# Patient Record
Sex: Male | Born: 1980 | Hispanic: Yes | Marital: Single | Smoking: Never smoker
Health system: Southern US, Community
[De-identification: ages and names within clinical notes are randomized; demographics above are authoritative.]

## PROBLEM LIST (undated history)

## (undated) DIAGNOSIS — R0989 Other specified symptoms and signs involving the circulatory and respiratory systems: Secondary | ICD-10-CM

## (undated) DIAGNOSIS — I509 Heart failure, unspecified: Secondary | ICD-10-CM

## (undated) HISTORY — PX: COARCTATION OF AORTA REPAIR: SHX261

---

## 2017-05-18 DIAGNOSIS — I517 Cardiomegaly: Secondary | ICD-10-CM | POA: Insufficient documentation

## 2017-05-18 DIAGNOSIS — R931 Abnormal findings on diagnostic imaging of heart and coronary circulation: Secondary | ICD-10-CM | POA: Insufficient documentation

## 2017-11-08 DIAGNOSIS — R600 Localized edema: Secondary | ICD-10-CM

## 2017-11-08 DIAGNOSIS — R740 Nonspecific elevation of levels of transaminase and lactic acid dehydrogenase [LDH]: Secondary | ICD-10-CM

## 2017-11-08 DIAGNOSIS — I509 Heart failure, unspecified: Secondary | ICD-10-CM

## 2017-11-08 DIAGNOSIS — R0602 Shortness of breath: Secondary | ICD-10-CM

## 2017-11-09 DIAGNOSIS — I5032 Chronic diastolic (congestive) heart failure: Secondary | ICD-10-CM

## 2017-11-09 DIAGNOSIS — I351 Nonrheumatic aortic (valve) insufficiency: Secondary | ICD-10-CM

## 2017-11-10 DIAGNOSIS — Q231 Congenital insufficiency of aortic valve: Secondary | ICD-10-CM

## 2017-11-13 DIAGNOSIS — I351 Nonrheumatic aortic (valve) insufficiency: Secondary | ICD-10-CM

## 2017-11-13 DIAGNOSIS — Q231 Congenital insufficiency of aortic valve: Secondary | ICD-10-CM

## 2017-11-13 DIAGNOSIS — I429 Cardiomyopathy, unspecified: Secondary | ICD-10-CM | POA: Insufficient documentation

## 2017-11-13 DIAGNOSIS — I5032 Chronic diastolic (congestive) heart failure: Secondary | ICD-10-CM | POA: Insufficient documentation

## 2017-11-13 DIAGNOSIS — R609 Edema, unspecified: Secondary | ICD-10-CM

## 2017-11-13 DIAGNOSIS — I5030 Unspecified diastolic (congestive) heart failure: Secondary | ICD-10-CM

## 2017-11-13 DIAGNOSIS — I509 Heart failure, unspecified: Secondary | ICD-10-CM | POA: Insufficient documentation

## 2017-11-13 DIAGNOSIS — E119 Type 2 diabetes mellitus without complications: Secondary | ICD-10-CM

## 2017-11-16 DIAGNOSIS — E119 Type 2 diabetes mellitus without complications: Secondary | ICD-10-CM | POA: Insufficient documentation

## 2017-11-18 ENCOUNTER — Ambulatory Visit (INDEPENDENT_AMBULATORY_CARE_PROVIDER_SITE_OTHER): Payer: Self-pay | Admitting: Cardiology

## 2017-11-18 ENCOUNTER — Encounter: Payer: Self-pay | Admitting: Cardiology

## 2017-11-18 VITALS — BP 108/64 | HR 76 | Ht 69.0 in | Wt 251.8 lb

## 2017-11-18 DIAGNOSIS — Q251 Coarctation of aorta: Secondary | ICD-10-CM

## 2017-11-18 DIAGNOSIS — I5032 Chronic diastolic (congestive) heart failure: Secondary | ICD-10-CM

## 2017-11-18 DIAGNOSIS — Q210 Ventricular septal defect: Secondary | ICD-10-CM

## 2017-11-18 DIAGNOSIS — I272 Pulmonary hypertension, unspecified: Secondary | ICD-10-CM

## 2017-11-18 DIAGNOSIS — I351 Nonrheumatic aortic (valve) insufficiency: Secondary | ICD-10-CM

## 2017-11-18 DIAGNOSIS — G4733 Obstructive sleep apnea (adult) (pediatric): Secondary | ICD-10-CM

## 2017-11-18 DIAGNOSIS — I422 Other hypertrophic cardiomyopathy: Secondary | ICD-10-CM | POA: Insufficient documentation

## 2017-11-18 NOTE — Progress Notes (Signed)
Cardiology Consultation:    Date:  11/18/2017   ID:  Gabriel Floyd, DOB 12/10/1980, MRN [id number]  PCP:  Patient, [name]  Cardiologist:  Gypsy Balsamobert Thaddus Mcdowell, MD   Referring MD: No ref. provider found   Chief Complaint  Patient presents with  . Hospitalization Follow-up  I am doing well now  History of Present Illness:    Gabriel Floyd is a 36 y.o. male who is being seen today for the evaluation of shortness of breath at the request of No ref. provider found.  He had a history of coarctation of the aorta that was [place] the age of 89 [place]  Since that time he has been doing great he was told everything is fine and there is no problem.  However with him period  Of last few weeks he started experiencing exertional shortness of breath.  He works as a Corporate investment bankerconstruction worker and a week before hospitalization he started having swelling of lower extremities shortness of breath he could not sleep well at night.  He also felt distention in his abdomen.  He ended up going to the hospital he was diagnosed with decompensated congestive heart failure and a lot of test has been done.  That include echocardiogram which showed significant left ventricle hypertrophy, likely there was no gradient in left ventricular outflow tract.  A transmitral flow pattern indicated restrictive flow.  It also indicated elevated filling pressures.  He was appropriately managed with diuretic.  TEE has been done as well to verify check for bicuspid aortic valve however valve was clearly tricuspid.  There was moderate aortic insufficiency present.  There was also investigation trying to figure out why he has elevation of the pulmonary pressure.  That included a CT of his chest which showed no evidence of pulmonary emboli.  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  Since the time of hospitalization he is doing great he denies having any chest pain tightness squeezing pressure burning chest he  can work with no difficulty everything returned to normal.   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  No past medical history on file.    Current Medications: Current Meds  Medication Sig  . furosemide (LASIX) 40 MG tablet Take 40 mg by mouth daily.     Allergies:   Patient has no known allergies.   Social History   Socioeconomic History  . Marital status: Unknown    Spouse name: None  . Number of children: None  . Years of education: None  . Highest education level: None  Social Needs  . Financial resource strain: None  . Food insecurity - worry: None  . Food insecurity - inability: None  . Transportation needs - medical: None  . Transportation needs - non-medical: None  Occupational History  . None  Tobacco Use  . Smoking status: Never Smoker  . Smokeless tobacco: Never Used  Substance and Sexual Activity  . Alcohol use: No    Frequency: Never  . Drug use: No  . Sexual activity: None  Other Topics Concern  . None  Social History Narrative  . None     Family History: The patient's family history includes Healthy in his father and mother. ROS:   Please see the history of present  illness.    All 14 point review of systems negative except as described per history of present illness.  EKGs/Labs/Other Studies Reviewed:    The following studies were reviewed today: CT of the chest, echocardiogram as well as TEE reviewed from hospital  EKG:  EKG is  ordered today.  The ekg ordered today demonstrates normal sinus rhythm at rate of 77, intraventricular conduction delay right bundle branch block pattern.  We will check blood pressure in both arms and both legs left arm 100/64 right arm 110/60 left leg 118/70 right leg 118/70  Recent Labs: No results found for requested labs within last 8760 hours.  Recent  Lipid Panel No results found for: CHOL, TRIG, HDL, CHOLHDL, VLDL, LDLCALC, LDLDIRECT  Physical Exam:    VS:  BP 108/64   Pulse 76   Ht 5\' 9"  (1.753 m)   Wt 251 lb 12.8 oz (114.2 kg)   SpO2 97%   BMI 37.18 kg/m     Wt Readings from Last 3 Encounters:  11/18/17 251 lb 12.8 oz (114.2 kg)     GEN:  Well nourished, well developed in no acute distress HEENT: Normal NECK: No JVD; No carotid bruits LYMPHATICS: No lymphadenopathy CARDIAC: RRR, no murmurs, no rubs, no gallops RESPIRATORY:  Clear to auscultation without rales, wheezing or rhonchi  ABDOMEN: Soft, non-tender, non-distended MUSCULOSKELETAL:  No edema; No deformity  SKIN: Warm and dry NEUROLOGIC:  Alert and oriented x 3 PSYCHIATRIC:  Normal affect   ASSESSMENT:    1. Nonrheumatic aortic valve insufficiency   2. Chronic diastolic CHF (congestive heart failure) (HCC)   3. Coarctation of aorta   4. Pulmonary hypertension, unspecified (HCC)   5. Obstructive sleep apnea    PLAN:    In order of problems listed above:  1. Nonrheumatic aortic valve regurgitation: There is been described as moderate.  I do not think it is responsible for his symptoms. 2. Chronic diastolic congestive heart failure.  Seems to be well managed he is only on diuretics in form of furosemide there is no potassium I will check his Chem-7 as well as proBNP today.  I will also check his complete metabolic panel since he did have some transaminase elevation.  I suspect that was related to congestion. 3. History of coarctation of the aorta status post repair.  I will review his CT from around of hospital to see if we can look at his aorta and see if there is any problem there.  He may require some additional testing to clarify that. 4. Pulmonary hypertension: So far etiology of this phenomenon is unclear.  It could be related to left side problem.  He does have significant diastolic dysfunction with restrictive pattern.  The way to act for this situation is  to manage his congestive heart failure and then recheck his pulmonary pressure.  Pulmonary emboli has been rule out by CT of his chest.  We also need to check for significant sleep apnea.  I will schedule him to have a sleep study. 5. Obstructive sleep apnea that I suspect apparently snores a lot however according to his family since he is being managed appropriately snoring is gone but as a part of evaluation for his pulmonary hypertension sleep study need to be rule out. 6. Obstructive hypertrophic cardiomyopathy.  Probably significantly responsible for his diastolic dysfunction.  He may require the addition of beta-blocker.  For now we will managed with diuretic his diastolic congestive heart failure.  He did have history coarctation  of the aorta however that was fixed when he was 36 years old.  I will check his blood pressure today on both arms and legs.  Future he may require a cardiac catheterization left and right mostly to check his pulmonary pressure if we need to initiate therapy for pulmonary hypertension.  I would hope however that with management of his diastolic congestive heart failure and sleep apnea if he has one pulmonary pressure will at least stabilize hopefully normalize.  I will see him back in my office in about 1 month or sooner if he has a problem.   Medication Adjustments/Labs and Tests Ordered: Current medicines are reviewed at length with the patient today.  Concerns regarding medicines are outlined above.  No orders of the defined types were placed in this encounter.  No orders of the defined types were placed in this encounter.   Signed, Georgeanna Lea, MD, [facility]. 11/18/2017 9:24 AM    [facility]

## 2017-11-18 NOTE — Patient Instructions (Signed)
Medicacion: NO hay cambio  Laboratorios: BNP and CMP  Examenes: estudio del Teacher, adult education en un mes  Any Other Special Instructions Will Be Listed Below (If Applicable).     If you need a refill on your cardiac medications before your next appointment, please call your pharmacy.   [facility]  Garey Ham, RN, BSN  Apnea del sueo (Sleep Apnea) La apnea del sueo es una afeccin en la que la respiracin se detiene o se hace superficial durante el sueo. Los episodios de apnea del sueo suelen durar 10 segundos o ms, y pueden ocurrir hasta 20 veces por hora. La apnea del sueo interrumpe el sueo y evita que el cuerpo descanse como lo necesita. Esta afeccin puede aumentar el riesgo de sufrir ciertos problemas de [place], como los siguientes:  Infarto de miocardio.  Ictus.  Obesidad.  Diabetes.  Insuficiencia cardaca.  Latidos cardacos irregulares. Existen tres tipos de apnea del sueo:  Tiene apnea obstructiva del sueo. Este tipo de apnea ocurre cuando las vas respiratorias se obstruyen o colapsan.  Apnea central del sueo. Este tipo ocurre cuando la parte del cerebro que controla la respiracin no enva las seales correctas a los msculos que controlan la respiracin.  Apnea mixta del sueo. Esta es una combinacin de apnea mixta y central del sueo. CAUSAS La causa ms frecuente de esta afeccin es la obstruccin o el colapso de las vas respiratorias. Las vas respiratorias pueden colapsar o bloquearse en los siguientes casos:  Los msculos de la garganta estn anormalmente relajados.  La lengua y las amgdalas son ms grandes que lo normal.  Tiene sobrepeso.  Las vas respiratorias son ms pequeas que lo normal. FACTORES DE RIESGO Es ms probable que esta afeccin se manifieste en las personas que:  Tienen sobrepeso.  Fuman.  Tienen vas respiratorias ms pequeas que lo normal.  Son ancianos.  Son hombres.  Bebe alcohol.  Toman sedantes  o tranquilizantes.  Tienen antecedentes familiares de apnea del sueo. SNTOMAS Los sntomas de esta afeccin incluyen lo siguiente:  Dificultad para quedarse dormido.  Somnolencia y Chemical engineer.  Irritabilidad.  Ronquidos fuertes.  Dolores de cabeza matutinos.  Dificultad para concentrarse.  Olvidos.  Disminucin del inters por el sexo.  Somnolencia sin motivo aparente.  Cambios en el estado de nimo.  Cambios en la personalidad.  Sentimientos de depresin.  Levantarse con frecuencia durante la noche para orinar.  [employer].  Dolor de Advertising copywriter. DIAGNSTICO Esta afeccin se puede diagnosticar a travs de lo siguiente:  Los antecedentes mdicos.  Un examen fsico.  Neomia Dear serie de pruebas que se hacen mientras la persona duerme (estudio del sueo). Estas pruebas generalmente se hacen en un laboratorio del sueo, pero tambin pueden Architectural technologist. TRATAMIENTO El tratamiento de esta afeccin tiene como objetivo restablecer la respiracin normal y [employer]ak sntomas durante el sueo. Puede implicar controlar los problemas de salud que pueden afectar la respiracin, como la hipertensin arterial o la obesidad. El tratamiento puede incluir lo siguiente:  Dormir de Mudlogger.  Si tiene congestin nasal, Freight forwarder.  Evitar el consumo de depresores, como el alcohol, sedantes y opiceos.  Si tiene sobrepeso, [employer].  Realizar cambios en la dieta.  Dejar de fumar.  Usar un dispositivo para abrir las vas respiratorias mientras duerme; por ejemplo: ? Un aparato bucal. Se trata de una boquilla hecha a medida que desplaza la mandbula hacia adelante. ? Un dispositivo de presin positiva y continua de  las vas respiratorias (PPC). Este dispositivo suministra oxgeno a las vas respiratorias a travs de Tourist information centre manageruna mscara. ? Un dispositivo de presin espiratoria nasal (EPAP) de las vas respiratorias Este dispositivo tiene vlvulas que se  colocan en cada fosa nasal. ? Un dispositivo de presin positiva de Colgate Palmolivedos niveles (BPAP) de las vas respiratorias. Este dispositivo suministra oxgeno a las vas respiratorias a travs de Tourist information centre manageruna mscara.  Someterse a Biomedical engineerciruga si los dems tratamientos no Comptrollerresultan eficaces. Durante la ciruga, el exceso de tejido se elimina para aumentar el ancho de las vas respiratorias. Realizar un tratamiento para la apnea del sueo es importante. Sin el tratamiento [place]adecuado, esta afeccin puede causar lo siguiente:  Hipertensin arterial.  Enfermedad arterial coronaria.  Incapacidad (del hombre) para alcanzar o Designer, fashion/clothingmantener una ereccin (impotencia).  Reduccin de las habilidades de pensamiento. INSTRUCCIONES PARA EL CUIDADO EN EL HOGAR  Haga cambios en su estilo de vida segn las recomendaciones de su mdico.  Consuma una dieta sana y [place]igua and Barbudabien equilibrada.  Tome los medicamentos de venta libre y los recetados solamente como se lo haya indicado el mdico.  Evite el uso de depresores, como el alcohol, sedantes y narcticos.  Si tiene sobrepeso, tome medidas para bajar de [place]peso.  Si le proporcionaron un dispositivo para abrir las vas respiratorias mientras duerme, selo solamente como se lo haya indicado el mdico.  No consuma ningn producto que contenga tabaco, lo que incluye cigarrillos, tabaco de Theatre managermascar y Administrator, Civil Servicecigarrillos electrnicos. Si necesita ayuda para dejar de fumar, consulte al mdico.  Concurra a todas las visitas de control como se lo haya indicado el mdico. Esto es importante. SOLICITE ATENCIN MDICA SI:  El dispositivo que recibi para abrir las vas respiratorias durante el sueo es incmodo o no parece funcionar.  Los sntomas no mejoran.  Los sntomas empeoran. SOLICITE ATENCIN MDICA DE INMEDIATO SI:  Siente dolor en el pecho.  Le falta el aire.  Desarrolla Dentistmalestar en la espalda, los brazos o el Peaceful Villageestmago.  Tiene dificultad para hablar.  Siente debilidad o adormecimiento en un  lado del cuerpo.  Tiene parlisis facial. Estos sntomas pueden representar un problema grave que constituye Radio broadcast assistantuna emergencia. No espere hasta que los sntomas desaparezcan. Solicite atencin mdica de inmediato. Comunquese con el servicio de emergencias de su localidad (911 en los [place]). No conduzca por sus propios medios OfficeMax Incorporatedhasta el hospital. Esta informacin no tiene Theme park managercomo fin reemplazar el consejo del mdico. Asegrese de hacerle al mdico cualquier pregunta que tenga. Document Released: 08/27/2005 Document Revised: 03/10/2016 Document Reviewed: 08/27/2015 Elsevier Interactive Patient Education  2018 [employer]vier Inc. Anlisis de pptidos natriurticos (Natriuretic Peptides Test) POR QU ME HAGO ESTE ANLISIS? Estos anlisis ayudan a diagnosticar la presencia de insuficiencia cardaca y a Chief Strategy Officerdeterminar su gravedad. Es posible que deba someterse a estas pruebas si est recibiendo tratamiento para la insuficiencia cardaca o si tiene sntomas de insuficiencia cardaca, incluidos las siguientes:  Falta de aire.  Fatiga. Hay tres pptidos natriurticos principales:  ANP. Es producido por la cavidad del corazn que recibe la sangre del organismo (Dorothyaurcula).  BNP. Es producido por la principal cavidad de bombeo del corazn (ventrculo izquierdo).  CNP. Es producido por las clulas que recubren los vasos sanguneos (clulas endoteliales). En particular, los niveles de BNP pueden ayudar al mdico a lo siguiente:  Diagnosticar anomalas e insuficiencia cardaca.  Controlar el avance y el tratamiento de la enfermedad. QU TIPO DE MUESTRA SE TOMA? Se requiere [place]una muestra de sangre para [place]este anlisis. Generalmente, se obtiene Marathon Oilmediante la insercin  de una aguja en una vena. CMO ME PREPARO PARA EL ANLISIS? No se requiere una preparacin para [employer]. CULES SON LOS VALORES DE REFERENCIA? Los valores de referencia son los valores saludables establecidos despus de Education officer, environmental el anlisis  a un grupo grande de personas sanas. Los valores de referencia pueden variar entre [employer], laboratorios y hospitales. Es su responsabilidad retirar el resultado del [place]. Consulte en el laboratorio o en el departamento en el que fue realizado el estudio cundo y cmo podr Starbucks Corporation. Los valores de referencia son los siguientes:  ANP: de 22 a 77pg/ml o de 22 a 77ng/l (unidades SI).  BNP: menos de 100pg/ml o menos de 100ng/l (unidades SI).  CNP: Estos valores an no se han determinado. QU SIGNIFICAN LOS RESULTADOS? Los Apache Corporation valores de referencia pueden indicar lo siguiente:  Insuficiencia cardaca.  Infarto de miocardio.  Presin arterial elevada (hipertensin arterial).  Rechazo del trasplante de corazn. Hable con el mdico para [employer], las opciones de tratamiento y, si es necesario, la necesidad de Education officer, environmental ms [place]. Hable con el mdico si tiene [employer]ne[employer]. Esta informacin no tiene Theme park manager el consejo del mdico. Asegrese de hacerle al mdico cualquier pregunta que tenga. Document Released: 09/07/2013 Document Revised: 12/08/2014 Document Reviewed: 05/08/2014 Elsevier Interactive Patient Education  2018 Ar[employer]itor.  Panel metablico completo (Comprehensive Metabolic Panel) El panel metablico completo (PMC) mide los niveles de las siguientes sustancias en la sangre:  Glucosa. La glucosa es un azcar simple que sirve como principal fuente de energa del organismo.  Creatinina. La creatinina es un producto de desecho de la actividad muscular normal y se elimina del cuerpo a travs de los riones.  Nitrgeno ureico en sangre (BUN). El nitrgeno ureico es un producto de desecho de la degradacin de las protenas que se produce cuando se degrada el exceso de protenas del organismo y se lo [place] para Engineer, drilling. Se elimina a travs de los riones.  Electrolitos. Los  electrolitos son partculas de carga positiva o negativa que se disuelven en el agua de diferentes compartimientos corporales, por ejemplo, el suero sanguneo, el agua dentro de las clulas y el agua fuera de ellas. Las Child psychotherapist de Customer service manager varan entre los diferentes compartimientos de lquidos. Los electrolitos son estrictamente regulados para Pharmacologist el equilibrio hidrosalino y Engineer, technical sales en el organismo. Los electrolitos incluyen lo siguiente: ? Potasio. ? Sodio. ? Cloruro. ? Calcio. ? Bicarbonato.  Fosfatasa alcalina (ALP). Esta protena se encuentra en todos los tejidos del cuerpo. Los huesos, el hgado y los conductos biliares contienen las cantidades ms elevadas.  Alanina-aminotransferasa (ALT). La ALT es una enzima que se encuentra en todo el cuerpo. Las concentraciones ms altas se hallan en los tejidos del hgado. Si hay dao heptico, tambin habr ALT en el torrente sanguneo.  Aspartato-aminotransferasa (AST). Otra enzima que se halla principalmente en el hgado. Tambin hay una alta concentracin en las clulas musculares y en el corazn. El anlisis de AST es til para controlar si hay dao heptico.  Bilirrubina. Cuando el hgado degrada los glbulos rojos, produce un desecho llamado bilirrubina. Un nivel alto de bilirrubina puede indicar la presencia de ciertos problemas de salud.  Albmina. Esta protena es un componente importante de la parte lquida de la sangre (suero). Se produce en el hgado y se la puede medir en el torrente sanguneo.  Protena total. Es la medicin de la cantidad Brunswick Corporation tipos de protenas que se hallan  en el suero sanguneo: la albmina y las globulinas. Las globulinas son parte del sistema inmunitario. El panel metablico completo exige la toma de [place] de sangre de una vena del brazo o la mano. PREPARACIN PARA EL ESTUDIO El mdico puede indicarle que no tome ni beba nada durante 6 a 8horas antes de la extraccin de la [place]  de [place]. RESULTADOS Es su responsabilidad retirar el resultado del [place]. Consulte en el laboratorio o en el departamento en el que fue realizado el estudio cundo y cmo podr Starbucks Corporation. Comunquese con el mdico si tiene Smith International. RANGO DE VALORES NORMALES Los rangos para los valores normales pueden variar entre diferentes laboratorios y hospitales. Siempre debe consultar a su mdico despus de realizarse un anlisis u otros estudios para saber si los valores de sus [place] se consideran dentro de los lmites normales. Los siguientes son los valores normales de cada seccin de un PMC: Glucosa  Cordn umbilical: de 45 a 96mg /dl o de 2,5 a 8,1XBJY/N (unidades SI).  Bebs prematuros: de 20 a 60mg /dl o de 1,1 a 8,2NFAO/Z.  Neonatos: de 30 a 60mg /dl o de 1,7 a 3,0QMVH/Q.  Bebs: de 40 a 90mg /dl o de 2,2 a 4,6NGEX/B.  Nios menores de 2aos: de 60 a 100mg /dl o de 3,3 a 2,8UXLK/G.  Adultos o nios mayores de 2aos: ? En ayunas. de 70 a 110mg /dl o menos de 4,0NUUV/O. ? Aleatorio (sin estar en ayunas o al azar): menos de o igual a 200mg /dl, o menos de 53,6UYQI/H.  Ancianos: aumento del valor normal despus de los 50aos. Creatinina  Nios menores de 2aos: de 0,1 a 0,4mg /dl.  Nios de 2 a 5aos: de 0,2 a 0,5mg /dl.  Nios de 6 a 9aos: de 0,3 a 0,6mg /dl.  Nios o adolescentes de 10 a 17aos: de 0,4 a 1,0mg /dl.  Adultos de 18 a 40aos: ? Mujeres: de 0,5 a 1,0mg /dl. ? Hombres: de 0,6 a 1,2mg /dl.  Adultos de 41 a 60aos: ? Mujeres: de 0,5 a 1,1mg /dl. ? Hombres: de 0,6 a 1,3mg /dl.  Adultos mayores de [phone]: ? Mujeres: de 0,5 a 1,2mg /dl. ? Hombres: de 0,7 a 1,3mg /dl. Nitrgeno ureico en sangre (BUN)  Cordn umbilical: de 21 a 40mg /dl.  Recin nacidos: de 3 a 12mg /dl.  Bebs: de 5 a 18mg /dl.  Nios: de 5 a 18mg /dl.  Adultos: de 10 a 20mg /dl o de 3,6 a 9,5GLOV/F (unidades SI).  Ancianos: los  valores pueden ser levemente ms [employer]. Potasio  Recin nacidos: de 3,9 a 5,42mEq/l.  Bebs: de 4,1 a 5,28mEq/l.  Nios: de 3,4 a 4,68mEq/l.  Adultos o ancianos: de 3,5 a 5,26mEq/l o de 3,5 a 5,58mmol/l (unidades SI). Sodio  Recin nacidos: de 134 a 131mEq/l.  Bebs: de 134 a 131mEq/l.  Nios: de 136 a 180mEq/l.  Adultos o ancianos: de 136 a 148mEq/l o de 136 a 174mmol/l (unidades SI). Cloruro  Bebs prematuros: de 95 a 133mEq/l.  Recin nacidos: de 96 a 163mEq/l.  Nios: de 90 a 115mEq/l.  Adultos o ancianos: de 98 a 176mEq/l o de 98 a 189mmol/l (unidades SI). Calcio  Calcio total: ? Recin nacidos de menos de 10das: de 7,6 a 10,4mg /dl o de 1,9 a 6,43PIRJ/J. ? Umbilical: de 9,0 a 11,5mg /dl o de 8,84 a 1,66AYTK/Z. ? De [address] a 2aos: de 9,0 a 10,6mg /dl o de 2,3 a 6,01UXNA/T. ? Nios: de 8,8 a 10,8mg /dl o de 2,2 a 5,5DDUK/G. ? Adultos: de 9,0 a 10,5mg /dl o de 2,54 a  2,33mmol/l.  Calcio ionizado: ? Recin nacidos: de 4,20 a 5,58mg /dl o de 4,09 a 8,11BJYN/W. ? De a 18aos: de 4,80 a 5,52mg /dl o de 2,95 a 6,21HYQM/V. ? Adultos: de 4,5 a 5,6mg /dl o de 7,84 a 6,96EXBM/W. Bicarbonato  Recin nacidos: de 13 a 38mEq/l.  Bebs: de 20 a 63mEq/l.  Nios: de 20 a 67mEq/l.  Adultos o ancianos: de 23 a 84mEq/l o de 23 a 68mmol/l (unidades SI). ALP  Nios menores de 2aos: de 85 a 235unidades/l.  De 2 a 8aos: de 65 a 210unidades/l.  De 9 a 15aos: de 60 a 300unidades/l.  De 16 a 21aos: de 30 a 200unidades/l.  Adultos: de 30 a 120unidades/l o de 0,5 a 2,0 microkatal/l (unidades SI).  Ancianos: valores levemente ms altos que Sears Holdings Corporation. ALT  Bebs: sus valores pueden ser dos veces ms altos que Sears Holdings Corporation.  Nios o adultos: de 4 a 36 unidades internacionales/l a 98,62F (37C) o de 4 a 36unidades/l (unidades SI).  Ancianos: sus valores pueden ser levemente ms altos que [employer]. AST  Recin nacidos de 0 a 5das de vida: de 35 a 140unidades/l.  Nios menores de 3aos: de 15 a 60unidades/l.  De 3 a 6aos: de 15 a 50unidades/l.  De 6 a 12aos: de 10 a 50unidades/l.  De 12 a 18aos: de 10 a 40unidades/l.  Adultos: de 0 a 35unidades/l o de 0 a 0,64microkatal/l (unidades SI).  Ancianos: valores levemente ms altos que Sears Holdings Corporation. Bilirrubina  Bilirrubina total en el recin nacido: de 1,0 a 12,0mg /dl o de 41,3 a 244 micromoles/l (unidades SI).  Adultos, ancianos o nios: ? Bilirrubina total: de 0,3 a 1,0mg /dl o de 5,1 a 01UUVOZDGUYQ/I. ? Bilirrubina indirecta: de 0,2 a 0,8mg /dl o de 3,4 a 34,7QQVZDGLOVF/I. ? Bilirrubina directa: de 0,1 a 0,3mg /dl o de 1,7 a 4,3PIRJJOACZY/S. Albmina  Bebs prematuros: de 3,0 a 4,2g/dl.  Recin nacidos: de 3,5 a 5,4g/dl.  Bebs: de 4,4 a 5,4g/dl.  Nios: de 4,0 a 5,9g/dl.  Adultos o ancianos: de 3,5 a 5,0g/dl o de 35 a 06T/K (unidades SI). Protena total  Bebs prematuros: de 4,2 a 7,6g/dl.  Recin nacidos: de 4,6 a 7,4g/dl.  Bebs: de 6,0 a 6,7g/dl.  Nios: de 6,2 a 8,0g/dl.  Adultos o ancianos: de 6,4 a 8,3g/dl o de 64 a 16W/F (unidades SI). SIGNIFICADO DE LOS RESULTADOS QUE ESTN FUERA DE LOS RANGOS DE LOS VALORES NORMALES La dieta y los niveles de actividad pueden tener un [employer] del [place], y a Occupational psychologist, pueden ser la causa de que los valores estn fuera de los lmites normales. Sin embargo, en ocasiones, los valores que estn fuera de los lmites normales pueden indicar la presencia de un trastorno mdico: Glucosa El aumento anormal de los niveles de glucosa (hiperglucemia) suele estar asociado con prediabetes y diabetes mellitus. Tambin puede presentarse cuando el organismo sufre una situacin de estrs intenso. Este estrs puede ser el resultado de una ciruga o de episodios tales como ictus o traumatismos. El hipertiroidismo y la pancreatitis o el  cncer de pncreas tambin pueden provocar el aumento anormal de los niveles de [place]. La disminucin anormal de los niveles de glucosa (hipoglucemia) puede presentarse cuando hay hipotiroidismo y tumores atpicos que segregan insulina (insulinoma). Creatinina El aumento anormal de los niveles de creatinina se observa con ms frecuencia en los casos de insuficiencia renal y, adems, cuando hay hipertiroidismo, enfermedades relacionadas con el crecimiento excesivo del cuerpo (acromegalia o gigantismo), destruccin  anormal del tejido muscular (rabdomilisis) y distrofia muscular temprana. La disminucin anormal de los niveles de creatinina puede indicar baja masa muscular asociada con desnutricin o distrofia muscular en etapa avanzada. Nitrgeno [employer] (BUN) El aumento anormal de los niveles de nitrgeno ureico en sangre (BUN), en especial, por encima de 50mg /dl, generalmente significa que los riones no estn funcionando normalmente. La disminucin anormal del BUN puede observarse en los casos de desnutricin e insuficiencia heptica. Potasio El aumento anormal de los niveles de potasio (hiperpotasiemia) se observa con mayor frecuencia en las enfermedades renales, la destruccin masiva de los glbulos rojos, (hemlisis) y la insuficiencia de las glndulas suprarrenales (enfermedad de [place]). La disminucin anormal de los niveles de potasio (hipopotasiemia) se observa cuando los niveles de la hormona aldosterona son excesivos (hiperaldosteronismo). Sodio El aumento anormal de los niveles de sodio (hipernatremia) puede observarse cuando hay deshidratacin, sed excesiva y miccin excesiva debido a una disminucin anormal de los niveles de la hormona antiduirtica (diabetes inspida). Adems, cuando hay hiperaldosteronismo y niveles excesivos de cortisol en el organismo (sndrome de [place]). La disminucin anormal de los niveles de sodio (hiponatremia) puede observarse cuando hay enfermedad  cardaca congestiva, cirrosis heptica, insuficiencia renal, as como en el sndrome de secrecin inadecuada de la hormona antidiurtica (SIHAD). Cloruro El aumento anormal de los niveles de cloruro (hipercloremia) puede observarse cuando hay insuficiencia renal aguda, diabetes inspida, diarrea prolongada e intoxicacin con aspirina o bromuro. La disminucin anormal de los niveles de cloruro (hipocloremia) puede observarse cuando hay episodios prolongados de vmitos, insuficiencia aguda de las glndulas suprarrenales (crisis [place]), hiperaldosteronismo y [place]. Calcio El aumento anormal de los niveles de calcio (hipercalcemia) puede presentarse con la hiperactividad de las glndulas paratiroideas (hiperparatiroidismo), ciertos tipos de cncer y un tipo de inflamacin que se observa en la sarcoidosis y la tuberculosis. La disminucin anormal de los niveles de calcio (hipocalcemia) puede observarse con la hipoactividad de las glndulas paratiroideas (hipoparatiroidismo), la deficiencia de la vitaminaD y la pancreatitis aguda. Bicarbonato El aumento anormal de los niveles de bicarbonato se observa despus de episodios prolongados de vmitos y Scientist, research (medical) diurtico, que derivan en la disminucin de la cantidad de cido en el organismo (alcalosis metablica). Tambin puede observarse en las enfermedades que aumentan la cantidad de bicarbonato en el organismo, entre ellas, el hiperaldosteronismo y los trastornos hereditarios poco frecuentes que interfieren en el control renal de los electrolitos, como el sndrome de Agricultural consultant. La disminucin anormal de los niveles de bicarbonato se observa en enfermedades que estimulan la produccin de cido del cuerpo (acidosis metablica), entre ellas, la diabetes mellitus que no se puede controlar y la intoxicacin por aspirina, metanol o anticongelantes (etilenglicol). ALP El aumento anormal del nivel de ALP puede ser un signo de ciertos tipos de cncer o tumores,  enfermedades hepticas, hepatitis, sarcoidosis, raquitismo, obstruccin de un conducto biliar o problemas seos, como una fractura. La disminucin anormal del nivel de ALP puede deberse a la desnutricin, la hipofosfatasia o la enfermedad de [place]. ALT El aumento anormal del nivel de ALT puede indicar la presencia de mononucleosis, pancreatitis o problemas hepticos. Los problemas hepticos incluyen hepatitis, cirrosis y cncer de hgado. AST El aumento anormal del nivel de AST puede indicar la presencia de algunos de los mismos problemas hepticos que se presentan con el aumento de la ALT. Tambin guarda relacin con enfermedades tales como la mononucleosis, la pancreatitis y con los traumatismos musculares. Bilirrubina El aumento anormal de la bilirrubina puede deberse a problemas hepticos, como la cirrosis, las enfermedades  hepticas y la hepatitis. Adems, a problemas de los conductos biliares, el pncreas o la vescula biliar. Albmina El aumento anormal de la albmina puede ser un signo de deshidratacin o del consumo de una dieta con alto contenido de protenas. Puede disminuir el nivel de albmina si consume una dieta con bajo contenido de protenas o se somete a una ciruga para bajar de [place]. La disminucin anormal del nivel de albmina tambin puede ser un signo de un problema de salud ms grave, como enfermedades hepticas, enfermedades renales o enfermedad de [place]. Protena total El aumento anormal del nivel de protena total suele indicar una infeccin (como hepatitisB oC, o VIH), mieloma mltiple o enfermedad de [place]. La disminucin anormal de los niveles de protena total se observa en las siguientes afecciones: desnutricin, quemaduras graves, hemorragias intensas y enfermedades hepticas. Esta informacin no tiene Theme park manager el consejo del mdico. Asegrese de hacerle al mdico cualquier pregunta que tenga. Document Released: 09/07/2013 Document Revised: 12/08/2014  Document Reviewed: 03/15/2014 Elsevier Interactive Patient Education  [employer].

## 2017-11-19 LAB — COMPREHENSIVE METABOLIC PANEL
ALBUMIN: 4.2 g/dL (ref 3.5–5.5)
ALT: 42 IU/L (ref 0–44)
AST: 37 IU/L (ref 0–40)
Albumin/Globulin Ratio: 1.3 (ref 1.2–2.2)
Alkaline Phosphatase: 73 IU/L (ref 39–117)
BUN / CREAT RATIO: 26 — AB (ref 9–20)
BUN: 23 mg/dL — AB (ref 6–20)
Bilirubin Total: 0.7 mg/dL (ref 0.0–1.2)
CALCIUM: 9.6 mg/dL (ref 8.7–10.2)
CO2: 22 mmol/L (ref 20–29)
CREATININE: 0.87 mg/dL (ref 0.76–1.27)
Chloride: 102 mmol/L (ref 96–106)
GFR, EST AFRICAN AMERICAN: 130 mL/min/{1.73_m2} (ref >59)
GFR, EST NON AFRICAN AMERICAN: 113 mL/min/{1.73_m2} (ref >59)
GLUCOSE: 119 mg/dL — AB (ref 65–99)
Globulin, Total: 3.2 g/dL (ref 1.5–4.5)
Potassium: 4.7 mmol/L (ref 3.5–5.2)
Sodium: 139 mmol/L (ref 134–144)
TOTAL PROTEIN: 7.4 g/dL (ref 6.0–8.5)

## 2017-11-19 LAB — PRO B NATRIURETIC PEPTIDE: NT-Pro BNP: 1071 pg/mL — ABNORMAL HIGH (ref 0–86)

## 2017-11-26 ENCOUNTER — Telehealth: Payer: Self-pay | Admitting: Cardiology

## 2017-11-26 NOTE — Telephone Encounter (Signed)
Kelli from [place] [facility] called and would like to discuss a referral from doctor. October 29, 1981

## 2017-11-27 NOTE — Telephone Encounter (Signed)
Order for sleep study will be sent.

## 2017-11-27 NOTE — Telephone Encounter (Signed)
Attempted to call Tresa Endo.

## 2017-12-02 NOTE — Telephone Encounter (Signed)
Faxed referral

## 2017-12-16 ENCOUNTER — Telehealth: Payer: Self-pay | Admitting: Cardiology

## 2017-12-16 NOTE — Telephone Encounter (Signed)
Patient stated that he was supposed to have a sleep study done and he has yet to be called by Dr. Terrilee Croak office regarding this. Please contact the office asap and I will call the patient.

## 2017-12-16 NOTE — Telephone Encounter (Signed)
Patient wants a call to ask a question in spanish

## 2017-12-18 NOTE — Telephone Encounter (Signed)
Left voicemail for Gabriel Floyd at Dr. Terrilee Croak office to call back

## 2017-12-21 ENCOUNTER — Ambulatory Visit: Payer: Self-pay | Admitting: Cardiology

## 2017-12-21 NOTE — Telephone Encounter (Signed)
Patient states that he cannot go that day due to a prior appointment. Will go by office tomorrow to reschedule.

## 2017-12-21 NOTE — Telephone Encounter (Signed)
He has a consult for Jan. 23 at 10 am, will have have translation

## 2017-12-22 ENCOUNTER — Other Ambulatory Visit: Payer: Self-pay

## 2017-12-22 MED ORDER — FUROSEMIDE 40 MG PO TABS: 40.0000 mg | ORAL_TABLET | Freq: Every day | ORAL | 1 refills | Status: DC

## 2017-12-22 NOTE — Telephone Encounter (Signed)
Patient will be going on 02/01 for his consultation with pulmonary.

## 2017-12-22 NOTE — Telephone Encounter (Signed)
Patient called stating that he will have his initial consultation with [facility] pulmonary on 02/01 at 10:00 am.

## 2017-12-24 ENCOUNTER — Other Ambulatory Visit: Payer: Self-pay

## 2017-12-24 ENCOUNTER — Telehealth: Payer: Self-pay | Admitting: Cardiology

## 2017-12-24 MED ORDER — FUROSEMIDE 40 MG PO TABS: 40.0000 mg | ORAL_TABLET | Freq: Every day | ORAL | 1 refills | Status: DC

## 2017-12-24 NOTE — Telephone Encounter (Signed)
Tishawn asked for you to call him, no reason given

## 2017-12-24 NOTE — Telephone Encounter (Signed)
Patient called stating that he had yet to receive his lasix. Refill was sent again.

## 2019-01-05 ENCOUNTER — Ambulatory Visit: Payer: Self-pay | Admitting: Cardiology

## 2019-02-18 ENCOUNTER — Other Ambulatory Visit: Payer: Self-pay | Admitting: Cardiology

## 2019-06-30 DIAGNOSIS — I361 Nonrheumatic tricuspid (valve) insufficiency: Secondary | ICD-10-CM

## 2019-06-30 DIAGNOSIS — I5032 Chronic diastolic (congestive) heart failure: Secondary | ICD-10-CM

## 2019-06-30 DIAGNOSIS — I517 Cardiomegaly: Secondary | ICD-10-CM

## 2019-06-30 DIAGNOSIS — Q231 Congenital insufficiency of aortic valve: Secondary | ICD-10-CM

## 2019-06-30 DIAGNOSIS — Z8679 Personal history of other diseases of the circulatory system: Secondary | ICD-10-CM

## 2019-06-30 DIAGNOSIS — R0989 Other specified symptoms and signs involving the circulatory and respiratory systems: Secondary | ICD-10-CM

## 2019-07-01 DIAGNOSIS — I351 Nonrheumatic aortic (valve) insufficiency: Secondary | ICD-10-CM

## 2019-07-01 DIAGNOSIS — I509 Heart failure, unspecified: Secondary | ICD-10-CM

## 2019-07-01 DIAGNOSIS — E876 Hypokalemia: Secondary | ICD-10-CM

## 2020-04-17 ENCOUNTER — Inpatient Hospital Stay (HOSPITAL_COMMUNITY)
Admission: AD | Admit: 2020-04-17 | Discharge: 2020-04-26 | DRG: 223 | Disposition: A | Discharge status: Home / Self Care | Payer: Self-pay | Source: Other Acute Inpatient Hospital | Attending: Internal Medicine | Admitting: Internal Medicine

## 2020-04-17 DIAGNOSIS — K761 Chronic passive congestion of liver: Secondary | ICD-10-CM | POA: Diagnosis present

## 2020-04-17 DIAGNOSIS — Z9289 Personal history of other medical treatment: Secondary | ICD-10-CM

## 2020-04-17 DIAGNOSIS — Z20822 Contact with and (suspected) exposure to covid-19: Secondary | ICD-10-CM | POA: Diagnosis present

## 2020-04-17 DIAGNOSIS — I422 Other hypertrophic cardiomyopathy: Secondary | ICD-10-CM | POA: Diagnosis present

## 2020-04-17 DIAGNOSIS — I272 Pulmonary hypertension, unspecified: Secondary | ICD-10-CM

## 2020-04-17 DIAGNOSIS — I5030 Unspecified diastolic (congestive) heart failure: Secondary | ICD-10-CM

## 2020-04-17 DIAGNOSIS — I2729 Other secondary pulmonary hypertension: Secondary | ICD-10-CM | POA: Diagnosis present

## 2020-04-17 DIAGNOSIS — I2781 Cor pulmonale (chronic): Secondary | ICD-10-CM | POA: Diagnosis present

## 2020-04-17 DIAGNOSIS — Z452 Encounter for adjustment and management of vascular access device: Secondary | ICD-10-CM

## 2020-04-17 DIAGNOSIS — K810 Acute cholecystitis: Secondary | ICD-10-CM | POA: Diagnosis present

## 2020-04-17 DIAGNOSIS — I442 Atrioventricular block, complete: Secondary | ICD-10-CM | POA: Diagnosis present

## 2020-04-17 DIAGNOSIS — G4733 Obstructive sleep apnea (adult) (pediatric): Secondary | ICD-10-CM | POA: Diagnosis present

## 2020-04-17 DIAGNOSIS — J984 Other disorders of lung: Secondary | ICD-10-CM | POA: Diagnosis present

## 2020-04-17 DIAGNOSIS — E876 Hypokalemia: Secondary | ICD-10-CM | POA: Diagnosis not present

## 2020-04-17 DIAGNOSIS — E860 Dehydration: Secondary | ICD-10-CM | POA: Diagnosis present

## 2020-04-17 DIAGNOSIS — Z8774 Personal history of (corrected) congenital malformations of heart and circulatory system: Secondary | ICD-10-CM

## 2020-04-17 DIAGNOSIS — I5082 Biventricular heart failure: Secondary | ICD-10-CM | POA: Diagnosis present

## 2020-04-17 DIAGNOSIS — I452 Bifascicular block: Secondary | ICD-10-CM | POA: Diagnosis present

## 2020-04-17 DIAGNOSIS — I083 Combined rheumatic disorders of mitral, aortic and tricuspid valves: Secondary | ICD-10-CM | POA: Diagnosis present

## 2020-04-17 DIAGNOSIS — I421 Obstructive hypertrophic cardiomyopathy: Secondary | ICD-10-CM

## 2020-04-17 DIAGNOSIS — I5043 Acute on chronic combined systolic (congestive) and diastolic (congestive) heart failure: Principal | ICD-10-CM | POA: Diagnosis present

## 2020-04-17 DIAGNOSIS — Z959 Presence of cardiac and vascular implant and graft, unspecified: Secondary | ICD-10-CM

## 2020-04-17 DIAGNOSIS — I351 Nonrheumatic aortic (valve) insufficiency: Secondary | ICD-10-CM

## 2020-04-17 DIAGNOSIS — E119 Type 2 diabetes mellitus without complications: Secondary | ICD-10-CM | POA: Diagnosis present

## 2020-04-17 HISTORY — DX: Other specified symptoms and signs involving the circulatory and respiratory systems: R09.89

## 2020-04-17 HISTORY — DX: Heart failure, unspecified: I50.9

## 2020-04-17 LAB — MRSA PCR SCREENING: MRSA by PCR: NEGATIVE

## 2020-04-17 LAB — SARS CORONAVIRUS 2 BY RT PCR (HOSPITAL ORDER, PERFORMED IN ~~LOC~~ HOSPITAL LAB): SARS Coronavirus 2: NEGATIVE

## 2020-04-17 MED ORDER — SODIUM CHLORIDE 0.9 % IV SOLN: 250.0000 mL | INTRAVENOUS | Status: DC | PRN

## 2020-04-17 MED ORDER — ONDANSETRON HCL 4 MG/2ML IJ SOLN: 4.0000 mg | Freq: Four times a day (QID) | INTRAMUSCULAR | Status: DC | PRN

## 2020-04-17 MED ORDER — SODIUM CHLORIDE 0.9% FLUSH: 3.0000 mL | INTRAVENOUS | Status: DC | PRN

## 2020-04-17 MED ORDER — ACETAMINOPHEN 325 MG PO TABS: 650.0000 mg | ORAL_TABLET | ORAL | Status: DC | PRN

## 2020-04-17 MED ORDER — PIPERACILLIN-TAZOBACTAM 3.375 G IVPB
3.3750 g | Freq: Three times a day (TID) | INTRAVENOUS | Status: DC
  Administered 2020-04-18 – 2020-04-21 (×10): 3.375 g via INTRAVENOUS
  Filled 2020-04-17 (×10): qty 50

## 2020-04-17 MED ORDER — NITROGLYCERIN 0.4 MG SL SUBL: 0.4000 mg | SUBLINGUAL_TABLET | SUBLINGUAL | Status: DC | PRN

## 2020-04-17 MED ORDER — PIPERACILLIN-TAZOBACTAM 3.375 G IVPB 30 MIN
3.3750 g | Freq: Once | INTRAVENOUS | Status: AC
  Administered 2020-04-17: 3.375 g via INTRAVENOUS
  Filled 2020-04-17: qty 50

## 2020-04-17 MED ORDER — SODIUM CHLORIDE 0.9% FLUSH
3.0000 mL | Freq: Two times a day (BID) | INTRAVENOUS | Status: DC
  Administered 2020-04-17 – 2020-04-19 (×3): 3 mL via INTRAVENOUS

## 2020-04-17 NOTE — Progress Notes (Signed)
Pharmacy Antibiotic Note  Gabriel Floyd is a 39 y.o. male admitted on 04/17/2020 with abdominal pain and possible acute cholecystitis.  Pharmacy has been consulted for zosyn dosing. -labs pending  Plan:  -Zosyn 3.375gm IV q8h -Will follow renal function, cultures and clinical progress    Temp (24hrs), Avg:99.1 F (37.3 C), Min:99.1 F (37.3 C), Max:99.1 F (37.3 C)  No results for input(s): WBC, CREATININE, LATICACIDVEN, VANCOTROUGH, VANCOPEAK, VANCORANDOM, GENTTROUGH, GENTPEAK, GENTRANDOM, TOBRATROUGH, TOBRAPEAK, TOBRARND, AMIKACINPEAK, AMIKACINTROU, AMIKACIN in the last 168 hours.  CrCl cannot be calculated (Patient's most recent lab result is older than the maximum 21 days allowed.).    No Known Allergies   Thank you for allowing pharmacy to be a part of this patient's care.  Harland German, PharmD Clinical Pharmacist **Pharmacist phone directory can now be found on [email or web address] ([email or web address]).  Listed under [facility].

## 2020-04-17 NOTE — Progress Notes (Signed)
Called surgery again this evening  They will see and are leaning towards cholecystostomy tube

## 2020-04-17 NOTE — H&P (Addendum)
Cardiology Admission History and Physical:   Patient ID: Gabriel Floyd MRN: [id number]; DOB: 06-Dec-1980   Admission date: 04/17/2020  Primary Care Provider: Patient, [name] Primary Cardiologist: Dr. Bing Matter Primary Electrophysiologist:  None   Chief Complaint:  CHB  Patient Profile:   Gabriel Floyd is a 39 y.o. male with PMHx of (in review of Care everywhere)  Aortic coarctation, s/p surgical repair 1987 (age 56), with residual ascending/descending thoracic aortic dilation on imaging 10/2017, and moderate AI, Ventricular septal defect (small by notes) and subaortic membrane, also repaired in 1987, HFpEF, with hypertrophic cardiomyopathy, denies history of hypertension, Diabetes versus pre-diabetes, and diastolic CHF   History of Present Illness:   The patient speaks and understands a fair amount of English, though we use Stratus video Alfonse Flavors # [phone]  Gabriel Floyd presented to [place] ER with c/o abdominal discomfort worsened after particular foods,, 2 days of N/V, pending GI evaluation next month Initial evaluation noted CXR w/NAD Abd [place]: Markedly thickened edematous GB wall with pericholecystic fluid and sonographic Murphy sign all suggesting acute cholecystitis.  NO definite gallstones are identified.  MRCP may be helpful for further evaluation.  Normal liver and normal caliber common bile duct  WBC 10./7 H/H 15/46 Plts 179 NA 138, K+ 3.6 BUN/Creat 24/1.00 Trop I: 0.04 NTproBNP 5830 AST 47 ALT 23 Lipase 37 T.bilirubin 1.7 (0.2-1.3)  He was noted to be in CHB and cardiology was called.  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  Dr. Dulce Sellar also mentions July 2020 TTE with EF 40-45%, mod AI.  Mentioning aslo :The statement that there is flow reversal error, he did not have flow reversal in the descending thoracic Aorta."  Dr. Dulce Sellar reached out to Dr. Graciela Husbands, with suspicion that his  abdominal pain may ne hepatic congestion 2/2 HF and advanced heart block now requested transfer to [facility] for further care nd EP management. Noting that he was taking no home medicines, also mentioned poor/essentially no outpatient follow up.  Looks like he last saw Dr. Bing Matter Dec 2018  He saw Dr. Tresa Res [facility] pulmonology) Dec 2019, he was felt to be euvolemic and doing well., felt repeat TTE needed to be repeated at a baseline volume status rather then when acutely decompensated to better establish his p.HTN status.  If suggest significant pulmonary hypertension, I would recommend RHC (also with shunt run) for further evaluation (can do here versus closer to home, especially if needed other left heart evaluation in setting of HOCM). Overall, group 2 physiology is more likely than group 1, but the latter is definitely still possible and would change management.   He tells me he has had waxing waning epigastric pain for weeks, escalating in the last few days.  He mentions for 2 days anytime he ate he would have pain and vomit.  Says he has thrown up about 12 times in the last 2-3 days, almost always triggerred by eating.  No blood,, sometimes retching without vomiting He has no resting pain currently, though is mildly tender to light/mod palpation of his epigastrium, no rebound, nontender the rest of his belly He reports a normal BM yesterday, though in the last couple weeks, sometimes he has noticed some blood in his stool.  He denies any CP, palpitations or cardiac awareness, no rest SOB, no DOE.  No symptoms of PND or orthopnea.  He denies feeling bloated or any recent/acute increase in abdominal girth.  Says  his weight fluctuates a few pounds up/down though essentially stable. No dizzy spells, near syncope or syncope. He says he has been taking the furosemide at home BID and the potassium daily as prescribed, and since his last hospital stay has felt very well, with no heart concerns.  He was  unaware of any cardiac abnormality he states he was told his heart rhythm was normal at [facility] is pending (has been sent) He denies symptoms of illness, no sick contacts    Medications Prior to Admission: Prior to Admission medications   Medication Sig Start Date End Date Taking? Authorizing Provider  furosemide (LASIX) 40 MG tablet Take 1 tablet by mouth once daily 02/18/19   Richardo Priest, MD     Allergies:   No Known Allergies  Social History:   Social History   Socioeconomic History  . Marital status: Unknown    Spouse name: Not on file  . Number of children: Not on file  . Years of education: Not on file  . Highest education level: Not on file  Occupational History  . Not on file  Tobacco Use  . Smoking status: Never Smoker  . Smokeless tobacco: Never Used  Substance and Sexual Activity  . Alcohol use: No  . Drug use: No  . Sexual activity: Not on file  Other Topics Concern  . Not on file  Social History Narrative  . Not on file   Social Determinants of Health   Financial Resource Strain:   . Difficulty of Paying Living Expenses:   Food Insecurity:   . Worried About Charity fundraiser in the Last Year:   . Arboriculturist in the Last Year:   Transportation Needs:   . Film/video editor (Medical):   Marland Kitchen Lack of Transportation (Non-Medical):   Physical Activity:   . Days of Exercise per Week:   . Minutes of Exercise per Session:   Stress:   . Feeling of Stress :   Social Connections:   . Frequency of Communication with Friends and Family:   . Frequency of Social Gatherings with Friends and Family:   . Attends Religious Services:   . Active Member of Clubs or Organizations:   . Attends Archivist Meetings:   Marland Kitchen Marital Status:   Intimate Partner Violence:   . Fear of Current or Ex-Partner:   . Emotionally Abused:   Marland Kitchen Physically Abused:   . Sexually Abused:     Family History:   The patient's family history includes Healthy  in his father and mother.   Mother with thyroid disease  ROS:  Please see the history of present illness.  All other ROS reviewed and negative.     Physical Exam/Data:  There were no vitals filed for this visit. No intake or output data in the 24 hours ending 04/17/20 1451 Last 3 Weights 11/18/2017  Weight (lbs) 251 lb 12.8 oz  Weight (kg) 114.216 kg     There is no height or weight on file to calculate BMI.  General:  Well nourished, well developed, in no acute distress HEENT: normal Lymph: no adenopathy Neck: no JVD Endocrine:  No thryomegaly Vascular: No carotid bruits Cardiac:  RRR; soft SM, no gallops or rubs Lungs:  CTA b/l, no wheezing, rhonchi or rales  Abd: soft, mild tenderness to light/mod palpation at the epigastrum, obese Ext: no edema Musculoskeletal:  No deformities Skin: warm and dry  Neuro:  No gross focal  motor abnormalities noted Psych:  Normal affect    EKG:  Done today at [facility] is CHB, LBBB, 46bpm Tele: CHB 40's-50's  Relevant CV Studies:  As discussed above, no cardiac data in EPIC  Cardiac testing summary: from [facility] TTE 10/2017: suboptimal views per notes LA 50, EF 50-55%, LVH (16, prox septum 21), mod AI, grade 3 DD Questioned bicuspid AV, but tricuspid on TEE RVH, RV dil/HK, mod Tr, est PASP 50+CVP No effusion. IVC NWS.    Echo 2020 EF 40%   Radiology/Studies:  No results found.  Assessment and Plan:   1. CHB     Asymptomatic     No nodal blockers at home     BP stable  2. Acute/chronic CHF     BNP was elevated at [facility] though he denies symptoms of volume OL and no overt findings on exam         CXR clear     Echo ordered       Will await Dr. Odessa Fleming exam, am holding NPO given his belly complaints and US findings in [place] Will give gentle fluids for now  3. Abd pain     ? Acute GB, seems less likely hepatic congestion     He is nontoxic appearing, afebrile with WBC wnl, no active pain currently     Consult  surgery  4. Presumed NICM     Last known EF 40-45%, July 2020 w/mod AI     Echo ordered  5. Congenital heart disease     Aortic coarctation, s/p surgical repair 1987 (age 27), with residual ascending/descending thoracic aortic dilation on imaging 10/2017, and moderate AI, Ventricular septal defect (small by notes) and subaortic membrane, also repaired in 1987      Severity of Illness: The appropriate patient status for this patient is INPATIENT. Inpatient status is judged to be reasonable and necessary in order to provide the required intensity of service to ensure the patient's safety. The patient's presenting symptoms, physical exam findings, and initial radiographic and laboratory data in the context of their chronic comorbidities is felt to place them at high risk for further clinical deterioration. Furthermore, it is not anticipated that the patient will be medically stable for discharge from the hospital within 2 midnights of admission. The following factors support the patient status of inpatient.   " The patient's presenting symptoms include abdominal pain, N/V. " The worrisome physical exam findings include CHB. " The initial radiographic and laboratory data are worrisome because of CHB, possible cholecystitis " The chronic co-morbidities include congenital heart disease.   * I certify that at the point of admission it is my clinical judgment that the patient will require inpatient hospital care spanning beyond 2 midnights from the point of admission due to high intensity of service, high risk for further deterioration and high frequency of surveillance required.    For questions or updates, please contact [facility] Please consult [email or web address] for contact info under        Signed, [name], PA-C  04/17/2020 2:51 PM   Complete heart block  Cardiomyopathy  EF 40%  AI   Hypertrophic Cardiomyopathy  Pulm HTN  Cholecystitis  Coarctation Repair //  Subaortic membrane repair Age 3  Residual dilitation of ascending descending aorta  CHF chronic systolic/diastolic   Complicated issue -- does he have cholecystitis and if so does he need surgery  Cardiac concerns include complete heart block, which can be addressed with either a permanent or temp  perm device.; pulm HTN which is clinically queiscent but will need to be assessed and echo is ordered, does he have HCM as suggested by 2018 echo report.  He is euvolemic and with minimal symptom; although he and his parents denied, they also asked if pacing would help his SOB>> will check BNP  Will check cMRI as will be difficult once device implanted-- IF HCM then ICD is indicated for EF < 50%    Tonight- surgery consult Order imaging, BNP as above  Hemodynamically stable with CHB  And euvolemic

## 2020-04-17 NOTE — Consult Note (Signed)
Reason for Consult:abdominal pain Referring Physician: Dr. [name] is an 39 y.o. male.  HPI: 61 M hispanic male, spanish speaking, with a PMHx s/f AI, CHF, Coarctation of Ao, Pulm HTN with a h/o 1 year abdominal pain. Pt states that he started this episode on Sunday night after a spicy diet. PT endorses n/v but no diarrhea or fever. Pt was seen at an OSH and underwent US and was found to have a thickened GB and pericholcystic fluid.  Pt with TB of 1.7.  Min leukocytosis 10.7. I did review his [place] and labs personally.  No past medical history on file.    Family History  Problem Relation Age of Onset  . Healthy Mother   . Healthy Father     Social History:  reports that he has never smoked. He has never used smokeless tobacco. He reports that he does not drink alcohol or use drugs.  Allergies: No Known Allergies  Medications: I have reviewed the patient's current medications.  Results for orders placed or performed during the hospital encounter of 04/17/20 (from the past 48 hour(s))  MRSA PCR Screening     Status: None   Collection Time: 04/17/20  2:40 PM   Specimen: Nasopharyngeal  Result Value Ref Range   MRSA by PCR NEGATIVE NEGATIVE    Comment:        The GeneXpert MRSA Assay (FDA approved for NASAL specimens only), is one component of a comprehensive MRSA colonization surveillance program. It is not intended to diagnose MRSA infection nor to guide or monitor treatment for MRSA infections. Performed at [facility], [place] [address] [place], [place] [address]   SARS Coronavirus 2 by RT PCR (hospital order, performed in [place] hospital lab)     Status: None   Collection Time: 04/17/20  3:42 PM  Result Value Ref Range   SARS Coronavirus 2 NEGATIVE NEGATIVE    Comment: (NOTE) SARS-CoV-2 target nucleic acids are NOT DETECTED. The SARS-CoV-2 RNA is generally detectable in upper and lower respiratory specimens during the acute phase of infection. The  lowest concentration of SARS-CoV-2 viral copies this assay can detect is 250 copies / mL. A negative result does not preclude SARS-CoV-2 infection and should not be used as the sole basis for treatment or other patient management decisions.  A negative result may occur with improper specimen collection / handling, submission of specimen other than nasopharyngeal swab, presence of viral mutation(s) within the areas targeted by this assay, and inadequate number of viral copies (<250 copies / mL). A negative result must be combined with clinical observations, patient history, and epidemiological information. Fact Sheet for Patients:   StrictlyIdeas.no Fact Sheet for Healthcare Providers: BankingDealers.co.za This test is not yet approved or cleared  by the [place] FDA and has been authorized for detection and/or diagnosis of SARS-CoV-2 by FDA under an Emergency Use Authorization (EUA).  This EUA will remain in effect (meaning this test can be used) for the duration of the COVID-19 declaration under Section 564(b)(1) of the Act, 21 U.S.C. section 360bbb-3(b)(1), unless the authorization is terminated or revoked sooner. Performed at [facility], [place] [address] [place], [place] [address]     No results found.  Review of Systems  Constitutional: Negative for chills and fever.  HENT: Negative for ear discharge, hearing loss and sore throat.   Eyes: Negative for discharge.  Respiratory: Negative for cough and shortness of breath.   Cardiovascular: Negative for chest pain  and leg swelling.  Gastrointestinal: Positive for abdominal pain, nausea and vomiting. Negative for constipation and diarrhea.  Musculoskeletal: Negative for myalgias and neck pain.  Skin: Negative for rash.  Allergic/Immunologic: Negative for environmental allergies.  Neurological: Negative for dizziness and seizures.  Hematological: Does not bruise/bleed  easily.  Psychiatric/Behavioral: Negative for suicidal ideas.  All other systems reviewed and are negative.  Blood pressure 97/67, pulse (!) 47, temperature 99.1 F (37.3 C), temperature source Oral, resp. rate (!) 26, SpO2 93 %. Physical Exam  Constitutional: He is oriented to person, place, and time. Vital signs are normal. He appears well-developed and well-nourished.  Conversant No acute distress  HENT:  Head: Normocephalic and atraumatic.  Eyes: Pupils are equal, round, and reactive to light. Conjunctivae and lids are normal. No scleral icterus.  Pupils are equal round and reactive No lid lag Moist conjunctiva  Neck: No tracheal tenderness present. No tracheal deviation present. No thyromegaly present.  No cervical lymphadenopathy  Cardiovascular: Normal rate, regular rhythm and intact distal pulses.  No murmur heard. Respiratory: Effort normal and breath sounds normal. He has no wheezes. He has no rales.  GI: Soft. Bowel sounds are normal. He exhibits no distension. There is no hepatosplenomegaly. There is abdominal tenderness (epigastrum). There is no rebound. No hernia.  Musculoskeletal:        General: Normal range of motion.  Lymphadenopathy:    He has no cervical adenopathy.  Neurological: He is alert and oriented to person, place, and time.  Normal gait and station  Skin: Skin is warm. No rash noted. No cyanosis. Nails show no clubbing.  Normal skin turgor  Psychiatric: Judgment normal.  Appropriate affect    Assessment/Plan: 46M with likely acute cholecystitis and complicated cardiac hx 3rd degree heart block CHF AI CM Pulm HTN OSA   1.  Due to his high cardiac risk and acute cholecystitis, I would rec IR placed cholecystostomy tube 2. Rec NPO for now 3. IV abx   Gabriel Floyd 04/17/2020, 9:51 PM

## 2020-04-18 ENCOUNTER — Inpatient Hospital Stay (HOSPITAL_COMMUNITY): Payer: Self-pay

## 2020-04-18 ENCOUNTER — Encounter (HOSPITAL_COMMUNITY)
Admission: AD | Disposition: A | Discharge status: Home / Self Care | Payer: Self-pay | Source: Other Acute Inpatient Hospital | Attending: Internal Medicine

## 2020-04-18 DIAGNOSIS — I5082 Biventricular heart failure: Secondary | ICD-10-CM

## 2020-04-18 DIAGNOSIS — I2721 Secondary pulmonary arterial hypertension: Secondary | ICD-10-CM

## 2020-04-18 DIAGNOSIS — I340 Nonrheumatic mitral (valve) insufficiency: Secondary | ICD-10-CM

## 2020-04-18 DIAGNOSIS — I351 Nonrheumatic aortic (valve) insufficiency: Secondary | ICD-10-CM

## 2020-04-18 HISTORY — PX: RIGHT HEART CATH: CATH118263

## 2020-04-18 LAB — POCT I-STAT EG7
Acid-Base Excess: 0 mmol/L (ref 0.0–2.0)
Acid-Base Excess: 0 mmol/L (ref 0.0–2.0)
Acid-base deficit: 1 mmol/L (ref 0.0–2.0)
Bicarbonate: 25.1 mmol/L (ref 20.0–28.0)
Bicarbonate: 25.4 mmol/L (ref 20.0–28.0)
Bicarbonate: 26.4 mmol/L (ref 20.0–28.0)
Calcium, Ion: 1.06 mmol/L — ABNORMAL LOW (ref 1.15–1.40)
Calcium, Ion: 1.1 mmol/L — ABNORMAL LOW (ref 1.15–1.40)
Calcium, Ion: 1.21 mmol/L (ref 1.15–1.40)
HCT: 40 % (ref 39.0–52.0)
HCT: 41 % (ref 39.0–52.0)
HCT: 42 % (ref 39.0–52.0)
Hemoglobin: 13.6 g/dL (ref 13.0–17.0)
Hemoglobin: 13.9 g/dL (ref 13.0–17.0)
Hemoglobin: 14.3 g/dL (ref 13.0–17.0)
O2 Saturation: 49 %
O2 Saturation: 51 %
O2 Saturation: 57 %
Potassium: 3.1 mmol/L — ABNORMAL LOW (ref 3.5–5.1)
Potassium: 3.1 mmol/L — ABNORMAL LOW (ref 3.5–5.1)
Potassium: 3.4 mmol/L — ABNORMAL LOW (ref 3.5–5.1)
Sodium: 142 mmol/L (ref 135–145)
Sodium: 144 mmol/L (ref 135–145)
Sodium: 145 mmol/L (ref 135–145)
TCO2: 26 mmol/L (ref 22–32)
TCO2: 27 mmol/L (ref 22–32)
TCO2: 28 mmol/L (ref 22–32)
pCO2, Ven: 44 mmHg (ref 44.0–60.0)
pCO2, Ven: 44.7 mmHg (ref 44.0–60.0)
pCO2, Ven: 46.7 mmHg (ref 44.0–60.0)
pH, Ven: 7.357 (ref 7.250–7.430)
pH, Ven: 7.361 (ref 7.250–7.430)
pH, Ven: 7.369 (ref 7.250–7.430)
pO2, Ven: 28 mmHg — CL (ref 32.0–45.0)
pO2, Ven: 28 mmHg — CL (ref 32.0–45.0)
pO2, Ven: 31 mmHg — CL (ref 32.0–45.0)

## 2020-04-18 LAB — CBC
HCT: 45.2 % (ref 39.0–52.0)
Hemoglobin: 14.8 g/dL (ref 13.0–17.0)
MCH: 28.8 pg (ref 26.0–34.0)
MCHC: 32.7 g/dL (ref 30.0–36.0)
MCV: 87.9 fL (ref 80.0–100.0)
Platelets: 190 10*3/uL (ref 150–400)
RBC: 5.14 MIL/uL (ref 4.22–5.81)
RDW: 13.8 % (ref 11.5–15.5)
WBC: 11.3 10*3/uL — ABNORMAL HIGH (ref 4.0–10.5)
nRBC: 0 % (ref 0.0–0.2)

## 2020-04-18 LAB — BASIC METABOLIC PANEL
Anion gap: 12 (ref 5–15)
BUN: 26 mg/dL — ABNORMAL HIGH (ref 6–20)
CO2: 20 mmol/L — ABNORMAL LOW (ref 22–32)
Calcium: 9 mg/dL (ref 8.9–10.3)
Chloride: 108 mmol/L (ref 98–111)
Creatinine, Ser: 1.34 mg/dL — ABNORMAL HIGH (ref 0.61–1.24)
GFR calc Af Amer: >60 mL/min (ref >60)
GFR calc non Af Amer: >60 mL/min (ref >60)
Glucose, Bld: 155 mg/dL — ABNORMAL HIGH (ref 70–99)
Potassium: 3.8 mmol/L (ref 3.5–5.1)
Sodium: 140 mmol/L (ref 135–145)

## 2020-04-18 LAB — ECHOCARDIOGRAM COMPLETE: Weight: 3742.53 oz

## 2020-04-18 LAB — BRAIN NATRIURETIC PEPTIDE: B Natriuretic Peptide: 1040.2 pg/mL — ABNORMAL HIGH (ref 0.0–100.0)

## 2020-04-18 SURGERY — RIGHT HEART CATH
Anesthesia: LOCAL

## 2020-04-18 MED ORDER — CHLORHEXIDINE GLUCONATE CLOTH 2 % EX PADS
6.0000 | MEDICATED_PAD | Freq: Every day | CUTANEOUS | Status: DC
  Administered 2020-04-18 – 2020-04-26 (×8): 6 via TOPICAL

## 2020-04-18 MED ORDER — HEPARIN (PORCINE) IN NACL 1000-0.9 UT/500ML-% IV SOLN
INTRAVENOUS | Status: DC | PRN
  Administered 2020-04-18: 500 mL

## 2020-04-18 MED ORDER — SODIUM CHLORIDE 0.9 % IV SOLN: INTRAVENOUS | Status: DC

## 2020-04-18 MED ORDER — HYDRALAZINE HCL 20 MG/ML IJ SOLN: 10.0000 mg | INTRAMUSCULAR | Status: AC | PRN

## 2020-04-18 MED ORDER — PERFLUTREN LIPID MICROSPHERE
INTRAVENOUS | Status: AC
  Administered 2020-04-18: 3 mL
  Filled 2020-04-18: qty 10

## 2020-04-18 MED ORDER — LIDOCAINE HCL (PF) 1 % IJ SOLN
INTRAMUSCULAR | Status: AC
  Filled 2020-04-18: qty 30

## 2020-04-18 MED ORDER — SODIUM CHLORIDE 0.9% FLUSH
3.0000 mL | INTRAVENOUS | Status: DC | PRN
  Administered 2020-04-26: 3 mL via INTRAVENOUS

## 2020-04-18 MED ORDER — SODIUM CHLORIDE 0.9 % IV SOLN: 80.0000 mg | INTRAVENOUS | Status: DC

## 2020-04-18 MED ORDER — ACETAMINOPHEN 325 MG PO TABS: 650.0000 mg | ORAL_TABLET | ORAL | Status: DC | PRN

## 2020-04-18 MED ORDER — LIDOCAINE HCL (PF) 1 % IJ SOLN
INTRAMUSCULAR | Status: DC | PRN
  Administered 2020-04-18: 2 mL via INTRADERMAL

## 2020-04-18 MED ORDER — SODIUM CHLORIDE 0.9% FLUSH
3.0000 mL | Freq: Two times a day (BID) | INTRAVENOUS | Status: DC
  Administered 2020-04-18 – 2020-04-24 (×10): 3 mL via INTRAVENOUS

## 2020-04-18 MED ORDER — HEPARIN (PORCINE) IN NACL 1000-0.9 UT/500ML-% IV SOLN
INTRAVENOUS | Status: AC
  Filled 2020-04-18: qty 500

## 2020-04-18 MED ORDER — FUROSEMIDE 10 MG/ML IJ SOLN
80.0000 mg | Freq: Two times a day (BID) | INTRAMUSCULAR | Status: DC
  Administered 2020-04-18 – 2020-04-23 (×9): 80 mg via INTRAVENOUS
  Filled 2020-04-18 (×10): qty 8

## 2020-04-18 MED ORDER — SODIUM CHLORIDE 0.9% FLUSH: 3.0000 mL | INTRAVENOUS | Status: DC | PRN

## 2020-04-18 MED ORDER — ENOXAPARIN SODIUM 40 MG/0.4ML ~~LOC~~ SOLN
40.0000 mg | SUBCUTANEOUS | Status: DC
  Administered 2020-04-20 – 2020-04-23 (×4): 40 mg via SUBCUTANEOUS
  Filled 2020-04-18 (×4): qty 0.4

## 2020-04-18 MED ORDER — DEXTROSE 5 % AND 0.9 % NACL IV BOLUS: 500.0000 mL | Freq: Once | INTRAVENOUS | Status: DC

## 2020-04-18 MED ORDER — GADOBUTROL 1 MMOL/ML IV SOLN
10.0000 mL | Freq: Once | INTRAVENOUS | Status: AC | PRN
  Administered 2020-04-18: 10 mL via INTRAVENOUS

## 2020-04-18 MED ORDER — SODIUM CHLORIDE 0.9 % IV SOLN: 250.0000 mL | INTRAVENOUS | Status: DC | PRN

## 2020-04-18 MED ORDER — SODIUM CHLORIDE 0.9% FLUSH: 3.0000 mL | Freq: Two times a day (BID) | INTRAVENOUS | Status: DC

## 2020-04-18 MED ORDER — CHLORHEXIDINE GLUCONATE 4 % EX LIQD: 60.0000 mL | Freq: Once | CUTANEOUS | Status: DC

## 2020-04-18 MED ORDER — POTASSIUM CHLORIDE CRYS ER 20 MEQ PO TBCR
40.0000 meq | EXTENDED_RELEASE_TABLET | Freq: Every day | ORAL | Status: DC
  Administered 2020-04-18 – 2020-04-24 (×7): 40 meq via ORAL
  Filled 2020-04-18 (×7): qty 2

## 2020-04-18 MED ORDER — ONDANSETRON HCL 4 MG/2ML IJ SOLN: 4.0000 mg | Freq: Four times a day (QID) | INTRAMUSCULAR | Status: DC | PRN

## 2020-04-18 MED ORDER — DEXTROSE-NACL 5-0.9 % IV SOLN: INTRAVENOUS | Status: DC

## 2020-04-18 MED ORDER — ASPIRIN 81 MG PO CHEW: 81.0000 mg | CHEWABLE_TABLET | ORAL | Status: DC

## 2020-04-18 MED ORDER — CEFAZOLIN SODIUM-DEXTROSE 2-4 GM/100ML-% IV SOLN
2.0000 g | INTRAVENOUS | Status: DC
Start: 2020-04-18 — End: 2020-04-18

## 2020-04-18 MED ORDER — LABETALOL HCL 5 MG/ML IV SOLN: 10.0000 mg | INTRAVENOUS | Status: AC | PRN

## 2020-04-18 SURGICAL SUPPLY — 6 items
CATH BALLN WEDGE 5F 110CM (CATHETERS) ×1 IMPLANT
PACK CARDIAC CATHETERIZATION (CUSTOM PROCEDURE TRAY) ×2 IMPLANT
PROTECTION STATION PRESSURIZED (MISCELLANEOUS) ×2
SHEATH GLIDE SLENDER 4/5FR (SHEATH) ×1 IMPLANT
STATION PROTECTION PRESSURIZED (MISCELLANEOUS) IMPLANT
TRANSDUCER W/STOPCOCK (MISCELLANEOUS) ×2 IMPLANT

## 2020-04-18 NOTE — Progress Notes (Addendum)
Progress Note  Patient Name: Gabriel Floyd Date of Encounter: 04/18/2020  Primary Cardiologist: Dr. Agustin Cree  Subjective   Stratus video interpretor, [name] (did not note his ID number) no complaints.  No abd pain unless palpated, no CP, SOB, dizziness  Inpatient Medications    Scheduled Meds: . Chlorhexidine Gluconate Cloth  6 each Topical Daily  . sodium chloride flush  3 mL Intravenous Q12H   Continuous Infusions: . sodium chloride    . piperacillin-tazobactam (ZOSYN)  IV 3.375 g (04/18/20 1478)   PRN Meds: sodium chloride, acetaminophen, nitroGLYCERIN, ondansetron (ZOFRAN) IV, sodium chloride flush   Vital Signs    Vitals:   04/18/20 0500 04/18/20 0600 04/18/20 0700 04/18/20 0724  BP: 114/75 114/73 118/69   Pulse: (!) 44 (!) 43 (!) 41   Resp: 15 19 19    Temp:    98.1 F (36.7 C)  TempSrc:    Oral  SpO2: 94% 93% 90%   Weight: 106.1 kg       Intake/Output Summary (Last 24 hours) at 04/18/2020 0750 Last data filed at 04/18/2020 0400 Gross per 24 hour  Intake 3 ml  Output 600 ml  Net -597 ml   Last 3 Weights 04/18/2020 11/18/2017  Weight (lbs) 233 lb 14.5 oz 251 lb 12.8 oz  Weight (kg) 106.1 kg 114.216 kg      Telemetry    CHB, 40;'s - Personally Reviewed  ECG    No new EKGs - Personally Reviewed  Physical Exam   Unchanged from yesterday GEN: No acute distress.   Neck: No JVD Cardiac: RRR, no murmurs, rubs, or gallops.  Respiratory: CTA. GI: Soft, mild tenderness to light palp of epigastrum, non-distended  MS: No edema; No deformity. Neuro:  Nonfocal  Psych: Normal affect   Labs    High Sensitivity Troponin:  No results for input(s): TROPONINIHS in the last 720 hours.    Chemistry Recent Labs  Lab 04/18/20 0238  NA 140  K 3.8  CL 108  CO2 20*  GLUCOSE 155*  BUN 26*  CREATININE 1.34*  CALCIUM 9.0  GFRNONAA >60  GFRAA >60  ANIONGAP 12     Hematology Recent Labs  Lab 04/18/20 0238  WBC 11.3*  RBC 5.14  HGB 14.8  HCT 45.2    MCV 87.9  MCH 28.8  MCHC 32.7  RDW 13.8  PLT 190    BNP Recent Labs  Lab 04/18/20 0238  BNP 1,040.2*     DDimer No results for input(s): DDIMER in the last 168 hours.   Radiology    No results found.  Cardiac Studies   Dr. Bettina Gavia also mentions July 2020 TTE with EF 40-45%, mod AI.  Mentioning aslo :The statement that there is flow reversal error, he did not have flow reversal in the descending thoracic Aorta."  TTE and c.MRI are ordered and pending  Patient Profile     39 y.o. male with PMHx of (in review of Care everywhere)  Aortic coarctation, s/p surgical repair 1987 (age 39), with residual ascending/descending thoracic aortic dilation on imaging 10/2017, and moderate AI, Ventricular septal defect (small by notes) and subaortic membrane, also repaired in 1987, HFpEF, with hypertrophic cardiomyopathy, denies history of hypertension, Diabetes versus pre-diabetes, and diastolic CHF, ?p.HTN  [name] presented to [facility] with c/o abdominal discomfort worsened after particular foods,, 2 days of N/V, He was noted to be in CHB and cardiology was called.  Found in CHB, with baseline known bifascicular block (RBBB, LAFB by report  and note), presents in CHB w/LBBB morphology, also found to have likely acute chole Transferred to [facility] for further management  Assessment & Plan     1. CHB     Asymptomatic     No nodal blockers at home     BP remains stable  2. Acute/chronic CHF     BNP was elevated at [facility] though he denies symptoms of volume OL and no overt findings on exam         CXR clear     Echo ordered       Pt denies symptoms of volume OL, though parents last evening in d/w Dr. Graciela Husbands suggested at least some degree/probably chronic DOE/SOB  BNP elevated, though exam not suggestive of volume OL Creat up some, ?antibiotocs ? Dehydrated given n/V for last few days, for now, no lasix   3. Abd pain     ? Acute GB, seems less likely hepatic congestion     He is  nontoxic appearing, afebrile WBC 11.3 today, no active pain currently at rest     Appreciate surgery recommendations     Defer management and further antibiotic recs to them, pending IR evaluation and placement of drain     NPO   4. Presumed NICM     Last known EF 40-45%, July 2020 w/mod AI     Echo ordered, pending   5. Congenital heart disease     Aortic coarctation, s/p surgical repair 1987 (age 39), with residual ascending/descending thoracic aortic dilation on imaging 10/2017, and moderate AI, Ventricular septal defect (small by notes) and subaortic membrane, also repaired in 1987     P.HTN?     May benefit from our AHF team evaluation    For questions or updates, please contact [facility] Please consult [email or web address] for contact info under        Signed, [name], PA-C  04/18/2020, 7:50 AM   Complete heart block  Cardiomyopathy  EF 40%  AI   Hypertrophic Cardiomyopathy  Pulm HTN  Cholecystitis  Coarctation Repair // Subaortic membrane repair Age 39             Residual dilitation of ascending descending aorta  CHF chronic systolic/diastolic  Kidney Injury acute   Plan for GB is cholecystostomy tube  Spoke with Dr Dorthea Cove  Will decide on RHC once echo is done  Await cMRI and anticipate device implantation tomorrow  If HCM with EF < 50 would appropriately get CRT-D o/w would get CRT-P  cMRI also will get [place] information on Coarc repair   BNP elevated, but Cr also,   Euvolemic on exam and significant decrease PO intake 2/2 hospital and nausea so will continue IV fluids

## 2020-04-18 NOTE — Interval H&P Note (Signed)
History and Physical Interval Note:  04/18/2020 4:50 PM  [name]  has presented today for surgery, with the diagnosis of pulmonary HTN.  The various methods of treatment have been discussed with the patient and family. After consideration of risks, benefits and other options for treatment, the patient has consented to  Procedure(s): RIGHT HEART CATH (N/A) as a surgical intervention.  The patient's history has been reviewed, patient examined, no change in status, stable for surgery.  I have reviewed the patient's chart and labs.  Questions were answered to the patient's satisfaction.     Gabriel Floyd

## 2020-04-18 NOTE — Consult Note (Signed)
Advanced Heart Failure Team Consult Note   Primary Physician: Patient, [name] PCP-Cardiologist:  Bing Matter Reason for Consultation: Severe PAH  HPI:    Gabriel Floyd is seen today for evaluation of PAH at the request of Dr. Shary Key   Gabriel Floyd is a 39 y.o. 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   Gabriel Floyd [facility] with c/o abdominal discomfort and n/v. LFts essentially normal except for t.bili 1.7. RUQ [place]: Markedly thickened edematous GB wall with pericholecystic fluid and sonographic Murphy sign all suggesting acute cholecystitis.  NO definite gallstones are identified.   He was a;so noted to be in CHB and cardiology was called.  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   Transferred to [facility] for further evaluation and possible PPM.   Echo EF 45-50% with severe LVH. RV dilated and moderate HK with severe septal flattening c/w RV pressure volume overload LV PW 1.30 cm LV IVS1.40 cm   cMRI ordered  1. Severely dilated left ventricle with moderate basal septal hypertrophy and moderately decreased systolic function (LVEF = 38%) with diffuse hypokinesis and no regional wall motion abnormalities. There is late gadolinium enhancement in the membranous portion of the interventricular septum - possibly representing prior VSD repair. No LGE is seen in the left ventricular  myocardium. 2. Moderately dilated right ventricle with significant hypertrabeculations and moderately decreased systolic function (RVEF = 31%). There are no regional wall motion abnormalities. 3.  Moderately dilated left and mildly dilated right atrium. 4. Normal size of the aortic root, ascending aorta. Severely dilated pulmonary artery measuring 45 mm consistent with pulmonary hypertension. 5.  Mild aortic, mitral and tricuspid regurgitation. 6.  Normal pericardium.  No pericardial effusion.  Given results of echo and cMRI I was asked to perform a RHC which showed very severe mixed PH with severely reduce CO and no evidence of shunting  RHC 04/17/20 RA = 15 prominent v-waves RV = 132/17 PA =  132/52 (77) PCW = 31 Fick cardiac output/index = 2.7/1.35 PVR = 17.3 WU FA sat = 99% PA sat = 49%, 51% SVC sat = 54%  Gabriel Floyd says he feels ok. He is able to lie flat on the cath table. + DOE. Ab pain and n/v some better. Denies syncope.   Review of Systems: [y] = yes, [ ]  = no    General: Weight gain [ ] ; Weight loss [ ] ; Anorexia [ y]; Fatigue ]; Fever [ ] ; Chills [ ] ; Weakness [ ]    Cardiac: Chest pain/pressure [ ] ; Resting SOB [ ] ; Exertional SOB [ y]; Orthopnea [ ] ; Pedal Edema [ y; Palpitations [ ] ; Syncope [ ] ; Presyncope [ ] ; Paroxysmal nocturnal dyspnea[ ]    Pulmonary: Cough [ y]; Wheezing[ ] ; Hemoptysis[ ] ; Sputum [ ] ; Snoring [ ]    GI: Vomiting[ ] ; Dysphagia[ ] ; Melena[ ] ; Hematochezia [ ] ; Heartburn[ ] ; Abdominal pain ]; Constipation [ ] ; Diarrhea [ ] ;  BRBPR [ ]    GU: Hematuria[ ] ; Dysuria [ ] ; Nocturia[ ]    Vascular: Pain in legs with walking [ ] ; Pain in feet with lying flat [ ] ; Non-healing sores [ ] ; Stroke [ ] ; TIA [ ] ; Slurred speech [ ] ;   Neuro: Headaches[ ] ; Vertigo[ ] ; Seizures[ ] ; Paresthesias[ ] ;Blurred vision [ ] ; Diplopia [ ] ; Vision changes [ ]    Ortho/Skin: Arthritis [ ] ; Joint pain [ ] ; Muscle pain [ ] ; Joint swelling [ ] ; Back Pain [ ] ; Rash [ ]      Psych: Depression[ ] ; Anxiety[ ]    Heme: Bleeding problems [ ] ; Clotting disorders [ ] ; Anemia [ ]    Endocrine: Diabetes Blue.Reese ]; Thyroid dysfunction[ ]   Home Medications Prior to Admission medications   Medication Sig Start Date End Date Taking? Authorizing Provider  furosemide (LASIX) 40 MG tablet Take 1 tablet by mouth once daily 02/18/19  Yes Munley, Hilton Cork, MD  loratadine (CLARITIN) 10 MG tablet Take 1 tablet by mouth daily.   Yes [provider]  Potassium Chloride ER 20 MEQ TBCR Take 1 tablet by mouth daily. 11/02/19  Yes [provider]    Past Medical History: As per HPI  Past Surgical History:  Family History: Family History  Problem Relation Age of Onset   Healthy Mother    Healthy Father     Social History: Social History   Socioeconomic History   Marital status: Unknown    Spouse name: Not on file   Number of children: Not on file   Years of education: Not on file   Highest education level: Not on file  Occupational History   Not on file  Tobacco Use   Smoking status: Never Smoker   Smokeless tobacco: Never Used  Substance and Sexual Activity   Alcohol use: No   Drug use: No   Sexual activity: Not on file  Other Topics Concern   Not on file  Social History Narrative   Not on file   Social Determinants of Health   Financial Resource Strain:    Difficulty of Paying Living Expenses:   Food Insecurity:    Worried About Charity fundraiser in the Last Year:    Arboriculturist in the Last Year:   Transportation Needs:    Film/video editor (Medical):    Lack of Transportation (Non-Medical):   Physical Activity:    Days of Exercise per Week:    Minutes of Exercise per Session:   Stress:    Feeling of Stress :   Social Connections:    Frequency of Communication with Friends and Family:    Frequency of Social Gatherings with Friends and Family:    Attends Religious Services:    Active Member of  Clubs or Organizations:    Attends Archivist Meetings:    Marital Status:     Allergies:  No Known Allergies  Objective:    Vital Signs:   Temp:  [98 F (36.7 C)-98.3 F (36.8 C)] 98.2 F (36.8 C) (05/19 1940) Pulse Rate:  [26-60] 57 (05/19 2000) Resp:  [14-30] 14 (05/19 2000) BP: (90-124)/(60-84) 100/67 (05/19 1900) SpO2:  [88 %-100 %] 94 % (05/19 2000) Weight:  [106.1 kg] 106.1 kg (05/19 0500)    Weight change: Filed Weights   04/18/20 0500  Weight: 106.1 kg    Intake/Output:   Intake/Output Summary (Last 24 hours) at 04/18/2020 2027 Last data filed at 04/18/2020 1945 Gross per 24 hour  Intake 583.83 ml  Output 1460 ml  Net -876.17 ml      Physical Exam    General:  Normal appearing male. No resp difficulty HEENT: normal Neck: supple. JVP to ear . Carotids 2+ bilat; no bruits. No lymphadenopathy or thyromegaly appreciated. Cor: PMI nondisplaced. Brady regular + RV lift Lungs: basilar crckles Abdomen: soft, nontender, nondistended. No hepatosplenomegaly. No bruits or masses. Good bowel sounds. Extremities: no cyanosis, clubbing, rash, 1-2+ edema Neuro: alert & orientedx3, cranial nerves grossly intact. moves all 4 extremities w/o difficulty. Affect pleasant   Telemetry   CHB 40-50s Personally reviewed   EKG    ECG at [facility]: CHB, LBBB, 46bpm Personally reviewed  Labs   Basic Metabolic Panel: Recent Labs  Lab 04/18/20 0238 04/18/20 1702 04/18/20 1706  NA 140 144   142 145  K 3.8 3.1*   3.4* 3.1*  CL 108  --   --   CO2 20*  --   --   GLUCOSE 155*  --   --   BUN 26*  --   --   CREATININE 1.34*  --   --   CALCIUM 9.0  --   --     Liver Function Tests: No results for input(s): AST, ALT, ALKPHOS, BILITOT, PROT, ALBUMIN in the last 168 hours. No results for input(s): LIPASE, AMYLASE in the last 168 hours. No results for input(s): AMMONIA in the last 168 hours.  CBC: Recent Labs  Lab 04/18/20 0238 04/18/20 1702  04/18/20 1706  WBC 11.3*  --   --   HGB 14.8 13.9   14.3 13.6  HCT 45.2 41.0   42.0 40.0  MCV 87.9  --   --   PLT 190  --   --     Cardiac Enzymes: No results for input(s): CKTOTAL, CKMB, CKMBINDEX, TROPONINI in the last 168 hours.  BNP: BNP (last 3 results) Recent Labs    04/18/20 0238  BNP 1,040.2*    ProBNP (last 3 results) No results for input(s): PROBNP in the last 8760 hours.   CBG: No results for input(s): GLUCAP in the last 168 hours.  Coagulation Studies: No results for input(s): LABPROT, INR in the last 72 hours.   Imaging   CARDIAC CATHETERIZATION  Result Date: 04/18/2020 Findings: RA = 15 prominent v-waves RV = 132/17 PA =  132/52 (77) PCW = 31 Fick cardiac output/index = 2.7/1.35 PVR = 17.3 WU FA sat = 99% PA sat = 49%, 51% SVC sat = 54% Assessment: 1. Very severe mixed pulmonary HTN 2. Severely reduced CO in setting of cor pulmonale 3. No evidence of intracardiac shunting Plan/Discussion: Will diurese. Await MRI. Will d/w [facility]H and congenital heart teams. May need IV therapies. Arvilla Meres, MD 5:16 PM   Gabriel CARDIAC MORPHOLOGY W WO CONTRAST  Result Date: 04/18/2020 CLINICAL DATA:  39 year old male with h/o repaired aortic coarctation, NICM, acute on chronic systolic and diastolic CHF and complete heart block. EXAM: CARDIAC MRI TECHNIQUE: The patient was scanned on a 1.5 Tesla GE magnet. A dedicated cardiac coil was used. Functional imaging was done using Fiesta sequences. 2,3, and 4 chamber views were done to assess for RWMA's. Modified Simpson's rule using a short axis stack was used to calculate an ejection fraction on a dedicated work Research officer, trade [employer]. The patient received 10 cc of Gadavist. After 10 minutes inversion recovery sequences were used to assess for infiltration and scar tissue. CONTRAST:  10 cc  of Gadavist FINDINGS: 1.  Severely dilated left ventricle with moderate basal septal hypertrophy and moderately decreased systolic  function (LVEF = 38%) with diffuse hypokinesis and no regional wall motion abnormalities. There is late gadolinium enhancement in the membranous portion of the interventricular septum - possibly representing prior VSD repair. No LGE is seen in the left ventricular myocardium. LVEDD: 78 mm LVESD: 65 mm LVEDV: 372 ml LVESV: 228 ml SV: 144 ml CO: 6.3 L/min Myocardial mass: 219 g 2. Moderately dilated right ventricle with significant hypertrabeculations and moderately decreased systolic function (RVEF = 31%). There are no regional wall motion abnormalities. 3.  Moderately dilated left and mildly dilated right atrium. 4. Normal size of the aortic root, ascending aorta. Severely dilated pulmonary artery measuring 45 mm consistent with pulmonary hypertension. 5.  Mild aortic, mitral and tricuspid regurgitation. 6.  Normal pericardium.  No pericardial effusion. IMPRESSION: 1. Severely dilated left ventricle with moderate basal septal hypertrophy and moderately decreased systolic function (LVEF = 38%) with diffuse hypokinesis and no regional wall motion abnormalities. There is late gadolinium enhancement in the membranous portion of the interventricular septum - possibly representing prior VSD repair. No LGE is seen in the left ventricular myocardium. 2. Moderately dilated right ventricle with significant hypertrabeculations and moderately decreased systolic function (RVEF = 31%). There are no regional wall motion abnormalities. 3.  Moderately dilated left and mildly dilated right atrium. 4. Normal size of the aortic root, ascending aorta. Severely dilated pulmonary artery measuring 45 mm consistent with pulmonary hypertension. 5.  Mild aortic, mitral and tricuspid regurgitation. 6.  Normal pericardium.  No pericardial effusion. MRA for evaluation of aortic coarctation will be performed on 04/19/2020. Electronically Signed   By: Tobias Alexander   On: 04/18/2020 17:38   ECHOCARDIOGRAM COMPLETE  Result Date: 04/18/2020     ECHOCARDIOGRAM REPORT   Patient Name:   Gabriel Floyd Date of Exam: 04/18/2020 Medical Rec #:  [id number]    Height:       69.0 in Accession #:    [id number]   Weight:       233.9 lb Date of Birth:  1981-07-23     BSA:          2.208 m Patient Age:    39 years     BP:           110/73 mmHg Patient Gender: M            HR:           43 bpm. Exam Location:  Inpatient Procedure: 2D Echo                           MODIFIED REPORT: This report was modified by Charlton Haws MD on 04/18/2020 due to typo.  Indications:     794.31 abnormal ECG  History:         Patient has prior history of Echocardiogram examinations, most                  recent 11/08/2017. Coarctation of aorta repair.  Sonographer:     Celene Skeen RDCS (AE) Referring Phys:  [id number] [name] Diagnosing Phys: Charlton Haws MD  Sonographer Comments: challenging windows IMPRESSIONS  1. Septal flattening suggesting Elevated PA pressures . Left ventricular ejection fraction, by estimation, is 45 to 50%. The left ventricle has mildly decreased function. The left ventricle demonstrates global hypokinesis. The left ventricular internal cavity size was mildly dilated. There is severe left ventricular hypertrophy.  Left ventricular diastolic parameters are indeterminate.  2. Right ventricular systolic function is moderately reduced. The right ventricular size is moderately enlarged.  3. Left atrial size was moderately dilated.  4. The mitral valve is normal in structure. Mild mitral valve regurgitation. No evidence of mitral stenosis.  5. The aortic valve is normal in structure. Aortic valve regurgitation is mild. No aortic stenosis is present.  6. Pulmonic valve regurgitation is moderate.  7. The inferior vena cava Not seen well. FINDINGS  Left Ventricle: Septal flattening suggesting Elevated PA pressures. Left ventricular ejection fraction, by estimation, is 45 to 50%. The left ventricle has mildly decreased function. The left ventricle demonstrates global  hypokinesis. Definity contrast agent was given IV to delineate the left ventricular endocardial borders. The left ventricular internal cavity size was mildly dilated. There is severe left ventricular hypertrophy. Left ventricular diastolic parameters are indeterminate. Right Ventricle: The right ventricular size is moderately enlarged. Right vetricular wall thickness was not assessed. Right ventricular systolic function is moderately reduced. Left Atrium: Left atrial size was moderately dilated. Right Atrium: Right atrial size was normal in size. Pericardium: There is no evidence of pericardial effusion. Mitral Valve: The mitral valve is normal in structure. There is mild thickening of the mitral valve leaflet(s). There is mild calcification of the mitral valve leaflet(s). Normal mobility of the mitral valve leaflets. Mild mitral valve regurgitation. No evidence of mitral valve stenosis. Tricuspid Valve: The tricuspid valve is normal in structure. Tricuspid valve regurgitation is trivial. No evidence of tricuspid stenosis. Aortic Valve: The aortic valve is normal in structure. Aortic valve regurgitation is mild. No aortic stenosis is present. Pulmonic Valve: The pulmonic valve was normal in structure. Pulmonic valve regurgitation is moderate. No evidence of pulmonic stenosis. Aorta: The aortic root is normal in size and structure. Venous: The inferior vena cava was not well visualized. The inferior vena cava Not seen well. IAS/Shunts: The interatrial septum was not well visualized.  LEFT VENTRICLE PLAX 2D LVIDd:         5.20 cm LVIDs:         4.00 cm LV PW:         1.30 cm LV IVS:        1.40 cm LVOT diam:     2.20 cm LV SV:         68 LV SV Index:   31 LVOT Area:     3.80 cm  RIGHT VENTRICLE RV S prime:     7.83 cm/s LEFT ATRIUM              Index LA diam:        4.90 cm  2.22 cm/m LA Vol (A2C):   109.0 ml 49.36 ml/m LA Vol (A4C):   85.2 ml  38.58 ml/m LA Biplane Vol: 98.6 ml  44.65 ml/m  AORTIC VALVE LVOT  Vmax:   84.20 cm/s LVOT Vmean:  56.300 cm/s LVOT VTI:    0.180 m  AORTA Ao Root diam: 2.70 cm  SHUNTS Systemic VTI:  0.18 m Systemic Diam: 2.20 cm Charlton Haws MD Electronically signed by Charlton Haws MD Signature Date/Time: 04/18/2020/10:27:07 AM    Final (Updated)       Medications:     Current Medications:  Chlorhexidine Gluconate Cloth  6 each Topical Daily   [START ON 04/19/2020] enoxaparin (LOVENOX) injection  40 mg Subcutaneous Q24H   furosemide  80 mg Intravenous BID   potassium chloride  40 mEq Oral Daily   sodium chloride  flush  3 mL Intravenous Q12H   sodium chloride flush  3 mL Intravenous Q12H     Infusions:  sodium chloride     sodium chloride     dextrose 5 % and 0.9% NaCl Stopped (04/18/20 1446)   piperacillin-tazobactam (ZOSYN)  IV 12.5 mL/hr at 04/18/20 1600       Assessment/Plan   1. Biventricular HF with severe pulmonary HTN - unifying diagnosis remains unclear to me but suspect may be related to congenital heart disease. There is no evidence of infiltrative process or ongoing on MRI to tie together conduction diseae - no evidence of ongoing shunting by RHC. cMRA to be performed tomorrow to better assess - aside from GI symptoms he has had surprisingly few symptoms despite severe PH and very low output - will start IV lasix and low threshold to start milrinone.  - once left-sided pressures down will need addition of selective PA vasodilators and may need to start with IV prostanoids - Will need to assess for OSA and need for home O2 support - Check serologies - I will d/w [facility]H and congenital teams tomorrow  2. CHB - will need CRT.  - timing to be determined based on need for other procedures  3. GI symptoms - suspect related to HF    Length of Stay: 1  Arvilla Meres, MD  04/18/2020, 8:27 PM  Advanced Heart Failure Team Pager [phone] (M-F; 7a - 4p)  Please contact [facility] for night-coverage after hours (4p -7a ) and  weekends on [email or web address]

## 2020-04-18 NOTE — Progress Notes (Signed)
Trauma/Critical Care Follow Up Note  Subjective:    Overnight Issues:   Objective:  Vital signs for last 24 hours: Temp:  [98 F (36.7 C)-99.1 F (37.3 C)] 98 F (36.7 C) (05/19 1057) Pulse Rate:  [41-49] 43 (05/19 1000) Resp:  [15-27] 23 (05/19 1000) BP: (89-130)/(60-97) 112/74 (05/19 1000) SpO2:  [90 %-100 %] 93 % (05/19 1000) Weight:  [106.1 kg] 106.1 kg (05/19 0500)  Hemodynamic parameters for last 24 hours:    Intake/Output from previous day: 05/18 0701 - 05/19 0700 In: 10.9 [I.V.:3; IV Piggyback:7.9] Out: 600 [Urine:600]  Intake/Output this shift: Total I/O In: 22.2 [IV Piggyback:22.2] Out: -   Vent settings for last 24 hours:    Physical Exam:  Gen: comfortable, no distress, non-toxic Neuro: non-focal exam HEENT: PERRL Neck: supple CV: RRR Pulm: unlabored breathing Abd: soft, focal epigastric TTP, no rebound/guarding GU: clear yellow urine Extr: wwp, no edema   Results for orders placed or performed during the hospital encounter of 04/17/20 (from the past 24 hour(s))  MRSA PCR Screening     Status: None   Collection Time: 04/17/20  2:40 PM   Specimen: Nasopharyngeal  Result Value Ref Range   MRSA by PCR NEGATIVE NEGATIVE  SARS Coronavirus 2 by RT PCR (hospital order, performed in [facility] hospital lab)     Status: None   Collection Time: 04/17/20  3:42 PM  Result Value Ref Range   SARS Coronavirus 2 NEGATIVE NEGATIVE  Basic metabolic panel     Status: Abnormal   Collection Time: 04/18/20  2:38 AM  Result Value Ref Range   Sodium 140 135 - 145 mmol/L   Potassium 3.8 3.5 - 5.1 mmol/L   Chloride 108 98 - 111 mmol/L   CO2 20 (L) 22 - 32 mmol/L   Glucose, Bld 155 (H) 70 - 99 mg/dL   BUN 26 (H) 6 - 20 mg/dL   Creatinine, Ser 1.60 (H) 0.61 - 1.24 mg/dL   Calcium 9.0 8.9 - 73.7 mg/dL   GFR calc non Af Amer >60 >60 mL/min   GFR calc Af Amer >60 >60 mL/min   Anion gap 12 5 - 15  CBC     Status: Abnormal   Collection Time: 04/18/20  2:38 AM    Result Value Ref Range   WBC 11.3 (H) 4.0 - 10.5 K/uL   RBC 5.14 4.22 - 5.81 MIL/uL   Hemoglobin 14.8 13.0 - 17.0 g/dL   HCT 10.6 26.9 - 48.5 %   MCV 87.9 80.0 - 100.0 fL   MCH 28.8 26.0 - 34.0 pg   MCHC 32.7 30.0 - 36.0 g/dL   RDW 46.2 70.3 - 50.0 %   Platelets 190 150 - 400 K/uL   nRBC 0.0 0.0 - 0.2 %  Brain natriuretic peptide     Status: Abnormal   Collection Time: 04/18/20  2:38 AM  Result Value Ref Range   B Natriuretic Peptide 1,040.2 (H) 0.0 - 100.0 pg/mL    Assessment & Plan:  Present on Admission: . Complete heart block (HCC)    LOS: 1 day   Additional comments:I reviewed the patient's new clinical lab test results.   and I reviewed the patients new imaging test results.    29M with acute vs acute on chronic cholecystitis and complicated cardiac hx  3rd degree heart block - EP eval pending for tem vs perm pacemaker Cholecystitis - non-toxic, on abx, WBC minimally elevated. Would recommend cholecystectomy preferentially for definitive management if cardiac  concerns can be addressed/optimized. Discussed with EP this AM, and no plans for needs to initiate or require maintenance of anti-platelet or anticoagulation therapy, so after cardiac optimization, can undergo lap chole this hospitalization. In the interim, antibiotic therapy is an appropriate management strategy and zosyn should be continued (day 2 of therapy) CHF - needs to be optimized, BNP is 1K AI, CM, Pulm HTN - needs additional workup, echo scheduled for today OSA  Jesusita Oka, MD Trauma & General Surgery Please use [email or web address] to contact on call provider  04/18/2020  *Care during the described time interval was provided by me. I have reviewed this patient's available data, including medical history, events of note, physical examination and test results as part of my evaluation.

## 2020-04-18 NOTE — H&P (View-Only) (Signed)
 Progress Note  Patient Name: Gabriel Floyd Date of Encounter: 04/18/2020  Primary Cardiologist: Dr. Krasowski  Subjective   Stratus video interpretor, Devontre (did not note his ID number) no complaints.  No abd pain unless palpated, no CP, SOB, dizziness  Inpatient Medications    Scheduled Meds: . Chlorhexidine Gluconate Cloth  6 each Topical Daily  . sodium chloride flush  3 mL Intravenous Q12H   Continuous Infusions: . sodium chloride    . piperacillin-tazobactam (ZOSYN)  IV 3.375 g (04/18/20 0621)   PRN Meds: sodium chloride, acetaminophen, nitroGLYCERIN, ondansetron (ZOFRAN) IV, sodium chloride flush   Vital Signs    Vitals:   04/18/20 0500 04/18/20 0600 04/18/20 0700 04/18/20 0724  BP: 114/75 114/73 118/69   Pulse: (!) 44 (!) 43 (!) 41   Resp: 15 19 19   Temp:    98.1 F (36.7 C)  TempSrc:    Oral  SpO2: 94% 93% 90%   Weight: 106.1 kg       Intake/Output Summary (Last 24 hours) at 04/18/2020 0750 Last data filed at 04/18/2020 0400 Gross per 24 hour  Intake 3 ml  Output 600 ml  Net -597 ml   Last 3 Weights 04/18/2020 11/18/2017  Weight (lbs) 233 lb 14.5 oz 251 lb 12.8 oz  Weight (kg) 106.1 kg 114.216 kg      Telemetry    CHB, 40;'s - Personally Reviewed  ECG    No new EKGs - Personally Reviewed  Physical Exam   Unchanged from yesterday GEN: No acute distress.   Neck: No JVD Cardiac: RRR, no murmurs, rubs, or gallops.  Respiratory: CTA. GI: Soft, mild tenderness to light palp of epigastrum, non-distended  MS: No edema; No deformity. Neuro:  Nonfocal  Psych: Normal affect   Labs    High Sensitivity Troponin:  No results for input(s): TROPONINIHS in the last 720 hours.    Chemistry Recent Labs  Lab 04/18/20 0238  NA 140  K 3.8  CL 108  CO2 20*  GLUCOSE 155*  BUN 26*  CREATININE 1.34*  CALCIUM 9.0  GFRNONAA >60  GFRAA >60  ANIONGAP 12     Hematology Recent Labs  Lab 04/18/20 0238  WBC 11.3*  RBC 5.14  HGB 14.8  HCT 45.2    MCV 87.9  MCH 28.8  MCHC 32.7  RDW 13.8  PLT 190    BNP Recent Labs  Lab 04/18/20 0238  BNP 1,040.2*     DDimer No results for input(s): DDIMER in the last 168 hours.   Radiology    No results found.  Cardiac Studies   Dr. Munley also mentions July 2020 TTE with EF 40-45%, mod AI.  Mentioning aslo :The statement that there is flow reversal error, he did not have flow reversal in the descending thoracic Aorta."  TTE and c.MRI are ordered and pending  Patient Profile     39 y.o. male with PMHx of (in review of Care everywhere)  Aortic coarctation, s/p surgical repair 1987 (age 5), with residual ascending/descending thoracic aortic dilation on imaging 10/2017, and moderate AI, Ventricular septal defect (small by notes) and subaortic membrane, also repaired in 1987, HFpEF, with hypertrophic cardiomyopathy, denies history of hypertension, Diabetes versus pre-diabetes, and diastolic CHF, ?p.HTN  Mr. Daughtry presented to [place] ER with c/o abdominal discomfort worsened after particular foods,, 2 days of N/V, He was noted to be in CHB and cardiology was called.  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 CHB     Asymptomatic     No nodal blockers at home     BP remains stable  2. Acute/chronic CHF     BNP was elevated at [place] though he denies symptoms of volume OL and no overt findings on exam         CXR clear     Echo ordered       Pt denies symptoms of volume OL, though parents last evening in d/w Dr. Klein suggested at least some degree/probably chronic DOE/SOB  BNP elevated, though exam not suggestive of volume OL Creat up some, ?antibiotocs ? Dehydrated given n/V for last few days, for now, no lasix   3. Abd pain     ? Acute GB, seems less likely hepatic congestion     He is  nontoxic appearing, afebrile WBC 11.3 today, no active pain currently at rest     Appreciate surgery recommendations     Defer management and further antibiotic recs to them, pending IR evaluation and placement of drain     NPO   4. Presumed NICM     Last known EF 40-45%, July 2020 w/mod AI     Echo ordered, pending   5. Congenital heart disease     Aortic coarctation, s/p surgical repair 1987 (age 5), with residual ascending/descending thoracic aortic dilation on imaging 10/2017, and moderate AI, Ventricular septal defect (small by notes) and subaortic membrane, also repaired in 1987     P.HTN?     May benefit from our AHF team evaluation    For questions or updates, please contact [facility] Please consult [email or web address] for contact info under        Signed, Renee Lynn Ursuy, PA-C  04/18/2020, 7:50 AM   Complete heart block  Cardiomyopathy  EF 40%  AI   Hypertrophic Cardiomyopathy  Pulm HTN  Cholecystitis  Coarctation Repair // Subaortic membrane repair Age 5             Residual dilitation of ascending descending aorta  CHF chronic systolic/diastolic  Kidney Injury acute   Plan for GB is cholecystostomy tube  Spoke with Dr DB  Will decide on RHC once echo is done  Await cMRI and anticipate device implantation tomorrow  If HCM with EF < 50 would appropriately get CRT-D o/w would get CRT-P  cMRI also will get us information on Coarc repair   BNP elevated, but Cr also,   Euvolemic on exam and significant decrease PO intake 2/2 hospital and nausea so will continue IV fluids      

## 2020-04-18 NOTE — Progress Notes (Signed)
  Echocardiogram 2D Echocardiogram has been performed.  Celene Skeen 04/18/2020, 9:55 AM

## 2020-04-19 ENCOUNTER — Inpatient Hospital Stay: Payer: Self-pay

## 2020-04-19 ENCOUNTER — Encounter (HOSPITAL_COMMUNITY)
Admission: AD | Disposition: A | Discharge status: Home / Self Care | Payer: Self-pay | Source: Other Acute Inpatient Hospital | Attending: Internal Medicine

## 2020-04-19 ENCOUNTER — Inpatient Hospital Stay (HOSPITAL_COMMUNITY): Payer: Self-pay

## 2020-04-19 ENCOUNTER — Other Ambulatory Visit: Payer: Self-pay

## 2020-04-19 DIAGNOSIS — I361 Nonrheumatic tricuspid (valve) insufficiency: Secondary | ICD-10-CM

## 2020-04-19 DIAGNOSIS — I351 Nonrheumatic aortic (valve) insufficiency: Secondary | ICD-10-CM

## 2020-04-19 DIAGNOSIS — I340 Nonrheumatic mitral (valve) insufficiency: Secondary | ICD-10-CM

## 2020-04-19 LAB — COMPREHENSIVE METABOLIC PANEL
ALT: 62 U/L — ABNORMAL HIGH (ref 0–44)
AST: 59 U/L — ABNORMAL HIGH (ref 15–41)
Albumin: 3.3 g/dL — ABNORMAL LOW (ref 3.5–5.0)
Alkaline Phosphatase: 48 U/L (ref 38–126)
Anion gap: 10 (ref 5–15)
BUN: 26 mg/dL — ABNORMAL HIGH (ref 6–20)
CO2: 26 mmol/L (ref 22–32)
Calcium: 8.7 mg/dL — ABNORMAL LOW (ref 8.9–10.3)
Chloride: 104 mmol/L (ref 98–111)
Creatinine, Ser: 1.44 mg/dL — ABNORMAL HIGH (ref 0.61–1.24)
GFR calc Af Amer: >60 mL/min (ref >60)
GFR calc non Af Amer: >60 mL/min (ref >60)
Glucose, Bld: 174 mg/dL — ABNORMAL HIGH (ref 70–99)
Potassium: 3.6 mmol/L (ref 3.5–5.1)
Sodium: 140 mmol/L (ref 135–145)
Total Bilirubin: 2.1 mg/dL — ABNORMAL HIGH (ref 0.3–1.2)
Total Protein: 6 g/dL — ABNORMAL LOW (ref 6.5–8.1)

## 2020-04-19 LAB — SURGICAL PCR SCREEN
MRSA, PCR: NEGATIVE
Staphylococcus aureus: NEGATIVE

## 2020-04-19 LAB — CBC
HCT: 42.9 % (ref 39.0–52.0)
Hemoglobin: 14.2 g/dL (ref 13.0–17.0)
MCH: 28.9 pg (ref 26.0–34.0)
MCHC: 33.1 g/dL (ref 30.0–36.0)
MCV: 87.2 fL (ref 80.0–100.0)
Platelets: 177 10*3/uL (ref 150–400)
RBC: 4.92 MIL/uL (ref 4.22–5.81)
RDW: 13.7 % (ref 11.5–15.5)
WBC: 8.7 10*3/uL (ref 4.0–10.5)
nRBC: 0 % (ref 0.0–0.2)

## 2020-04-19 LAB — SEDIMENTATION RATE: Sed Rate: 4 mm/hr (ref 0–16)

## 2020-04-19 SURGERY — BIV PACEMAKER INSERTION CRT-P

## 2020-04-19 MED ORDER — SODIUM CHLORIDE 0.9% FLUSH
10.0000 mL | Freq: Two times a day (BID) | INTRAVENOUS | Status: DC
  Administered 2020-04-19 – 2020-04-25 (×9): 10 mL

## 2020-04-19 MED ORDER — SODIUM CHLORIDE 0.9 % IV SOLN: 80.0000 mg | INTRAVENOUS | Status: AC

## 2020-04-19 MED ORDER — MUPIROCIN 2 % EX OINT
TOPICAL_OINTMENT | CUTANEOUS | Status: AC
  Filled 2020-04-19: qty 22

## 2020-04-19 MED ORDER — SODIUM CHLORIDE 0.9% FLUSH: 10.0000 mL | INTRAVENOUS | Status: DC | PRN

## 2020-04-19 MED ORDER — CEFAZOLIN SODIUM-DEXTROSE 2-4 GM/100ML-% IV SOLN: 2.0000 g | INTRAVENOUS | Status: AC

## 2020-04-19 MED ORDER — SODIUM CHLORIDE 0.9 % IV SOLN: INTRAVENOUS | Status: DC

## 2020-04-19 MED ORDER — CHLORHEXIDINE GLUCONATE 4 % EX LIQD
60.0000 mL | Freq: Once | CUTANEOUS | Status: AC
  Administered 2020-04-19: 4 via TOPICAL
  Filled 2020-04-19: qty 60

## 2020-04-19 MED ORDER — CHLORHEXIDINE GLUCONATE 4 % EX LIQD
60.0000 mL | Freq: Once | CUTANEOUS | Status: AC
  Administered 2020-04-19: 4 via TOPICAL

## 2020-04-19 MED ORDER — POTASSIUM CHLORIDE CRYS ER 20 MEQ PO TBCR
40.0000 meq | EXTENDED_RELEASE_TABLET | Freq: Once | ORAL | Status: AC
  Administered 2020-04-19: 40 meq via ORAL
  Filled 2020-04-19: qty 2

## 2020-04-19 MED ORDER — MILRINONE LACTATE IN DEXTROSE 20-5 MG/100ML-% IV SOLN
0.1250 ug/kg/min | INTRAVENOUS | Status: DC
  Administered 2020-04-19 – 2020-04-21 (×4): 0.125 ug/kg/min via INTRAVENOUS
  Filled 2020-04-19 (×5): qty 100

## 2020-04-19 MED ORDER — GADOBUTROL 1 MMOL/ML IV SOLN
10.0000 mL | Freq: Once | INTRAVENOUS | Status: AC | PRN
  Administered 2020-04-19: 10 mL via INTRAVENOUS

## 2020-04-19 NOTE — Progress Notes (Addendum)
Advanced Heart Failure Rounding Note  PCP-Cardiologist: No primary care provider on file.    Patient Profile   Mr Vondrasek is a 39 y.o. 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   Ad[facility] for acute cholecystitis and found to be in CHB=>>transfered to [facility] for potential PPM.   Echo EF 45-50% with severe LVH. RV dilated and moderate HK with severe septal flattening c/w RV pressure volume overload LV PW 1.30 cm LV IVS1.40 cm   Subquent RHC showed very severe mixed PH with severely reduce CO (CI 1.6) and no evidence of shunting   Subjective:    Feels ok this morning. No cardiac complaints. Denies dyspnea. No hypoxia.   Remains bradycardic on tele w/ CHB, HR in the 40s, but mentating well. BP stable.    -2L out yesterday w/ IV Lasix but remains fluid overloaded.   On Zosyn for cholecystitis. AF. WBC 8.7.     Objective:   Weight Range: 106.1 kg Body mass index is 34.54 kg/m.   Vital Signs:   Temp:  [98 F (36.7 C)-98.4 F (36.9 C)] 98.4 F (36.9 C) (05/20 0330) Pulse Rate:  [0-77] 44 (05/20 0900) Resp:  [0-31] 17 (05/20 0800) BP: (90-137)/(59-102) 120/76 (05/20 0900) SpO2:  [0 %-100 %] 96 % (05/20 0900) Weight:  [106.1 kg] 106.1 kg (05/20 0500)    Weight change: Filed Weights   04/18/20 0500 04/19/20 0500  Weight: 106.1 kg 106.1 kg    Intake/Output:   Intake/Output Summary (Last 24 hours) at 04/19/2020 0949 Last data filed  at 04/19/2020 0800 Gross per 24 hour  Intake 841.42 ml  Output 2095 ml  Net -1253.58 ml      Physical Exam    General:  Well appearing obese male. No resp difficulty HEENT: Normal Neck: Supple. elevated JVP . Carotids 2+ bilat; no bruits. No lymphadenopathy or thyromegaly appreciated. Cor: PMI nondisplaced. Regular rhythm, slow rate. No rubs, gallops or murmurs. Lungs: Clear Abdomen: obese, soft, nontender, nondistended. No hepatosplenomegaly. No bruits or masses. Good bowel sounds. Extremities: No cyanosis, clubbing, rash, 1+ bilateral edema Neuro: Alert & orientedx3, cranial nerves grossly intact. moves all 4 extremities w/o difficulty. Affect pleasant   Telemetry   CHB bradycardia 43 bpm  Labs    CBC Recent Labs    04/18/20 0238 04/18/20 1702 04/18/20 1706 04/19/20 0701  WBC 11.3*  --   --  8.7  HGB 14.8   < > 13.6 14.2  HCT 45.2   < > 40.0 42.9  MCV 87.9  --   --  87.2  PLT 190  --   --  177   < > = values in this interval not displayed.   Basic Metabolic Panel Recent Labs    76/73/41 0238 04/18/20 1702 04/18/20 1706 04/19/20 0701  NA 140   < > 145 140  K 3.8   < > 3.1* 3.6  CL 108  --   --  104  CO2 20*  --   --  26  GLUCOSE 155*  --   --  174*  BUN 26*  --   --  26*  CREATININE 1.34*  --   --  1.44*  CALCIUM 9.0  --   --  8.7*   < > = values in this interval not displayed.   Liver Function Tests Recent Labs    04/19/20 0701  AST 59*  ALT 62*  ALKPHOS 48  BILITOT 2.1*  PROT 6.0*  ALBUMIN 3.3*   No results for input(s): LIPASE, AMYLASE in the last 72 hours. Cardiac Enzymes No results for input(s): CKTOTAL, CKMB, CKMBINDEX, TROPONINI in the last 72 hours.  BNP: BNP (last 3 results) Recent Labs    04/18/20 0238  BNP 1,040.2*    ProBNP (last 3 results) No results for input(s): PROBNP in the last 8760 hours.   D-Dimer No results for input(s): DDIMER in the last 72 hours. Hemoglobin A1C No results for input(s): HGBA1C in the last 72  hours. Fasting Lipid Panel No results for input(s): CHOL, HDL, LDLCALC, TRIG, CHOLHDL, LDLDIRECT in the last 72 hours. Thyroid Function Tests No results for input(s): TSH, T4TOTAL, T3FREE, THYROIDAB in the last 72 hours.  Invalid input(s): FREET3  Other results:   Imaging    CARDIAC CATHETERIZATION  Result Date: 04/18/2020 Findings: RA = 15 prominent v-waves RV = 132/17 PA =  132/52 (77) PCW = 31 Fick cardiac output/index = 2.7/1.35 PVR = 17.3 WU FA sat = 99% PA sat = 49%, 51% SVC sat = 54% Assessment: 1. Very severe mixed pulmonary HTN 2. Severely reduced CO in setting of cor pulmonale 3. No evidence of intracardiac shunting Plan/Discussion: Will diurese. Await MRI. Will d/w [facility] PAH and congenital heart teams. May need IV therapies. Arvilla Meres, MD 5:16 PM   MR CARDIAC MORPHOLOGY W WO CONTRAST  Result Date: 04/18/2020 CLINICAL DATA:  39 year old male with h/o repaired aortic coarctation, NICM, acute on chronic systolic and diastolic CHF and complete heart block. EXAM: CARDIAC MRI TECHNIQUE: The patient was scanned on a 1.5 Tesla GE magnet. A dedicated cardiac coil was used. Functional imaging was done using Fiesta sequences. 2,3, and 4 chamber views were done to assess for RWMA's. Modified Simpson's rule using a short axis stack was used to calculate an ejection fraction on a dedicated work [employer]. The patient received 10 cc of Gadavist. After 10 minutes inversion recovery sequences were used to assess for infiltration and scar tissue. CONTRAST:  10 cc  of Gadavist FINDINGS: 1. Severely dilated left ventricle with moderate basal septal hypertrophy and moderately decreased systolic function (LVEF = 38%) with diffuse hypokinesis and no regional wall motion abnormalities. There is late gadolinium enhancement in the membranous portion of the interventricular septum - possibly representing prior VSD repair. No LGE is seen in the left ventricular myocardium. LVEDD: 78 mm  LVESD: 65 mm LVEDV: 372 ml LVESV: 228 ml SV: 144 ml CO: 6.3 L/min Myocardial mass: 219 g 2. Moderately dilated right ventricle with significant hypertrabeculations and moderately decreased systolic function (RVEF = 31%). There are no regional wall motion abnormalities. 3.  Moderately dilated left and mildly dilated right atrium. 4. Normal size of the aortic root, ascending aorta. Severely dilated pulmonary artery measuring 45 mm consistent with pulmonary hypertension. 5.  Mild aortic, mitral and tricuspid regurgitation. 6.  Normal pericardium.  No pericardial effusion. IMPRESSION: 1. Severely dilated left ventricle with moderate basal septal hypertrophy and moderately decreased systolic function (LVEF = 38%) with diffuse hypokinesis and  no regional wall motion abnormalities. There is late gadolinium enhancement in the membranous portion of the interventricular septum - possibly representing prior VSD repair. No LGE is seen in the left ventricular myocardium. 2. Moderately dilated right ventricle with significant hypertrabeculations and moderately decreased systolic function (RVEF = 31%). There are no regional wall motion abnormalities. 3.  Moderately dilated left and mildly dilated right atrium. 4. Normal size of the aortic root, ascending aorta. Severely dilated pulmonary artery measuring 45 mm consistent with pulmonary hypertension. 5.  Mild aortic, mitral and tricuspid regurgitation. 6.  Normal pericardium.  No pericardial effusion. MRA for evaluation of aortic coarctation will be performed on 04/19/2020. Electronically Signed   By: Ena Dawley   On: 04/18/2020 17:38   ECHOCARDIOGRAM COMPLETE  Result Date: 04/18/2020    ECHOCARDIOGRAM REPORT   Patient Name:   JHAYDEN DEMURO Date of Exam: 04/18/2020 Medical Rec #:  [id number]    Height:       69.0 in Accession #:    [id number]   Weight:       233.9 lb Date of Birth:  02-11-1981     BSA:          2.208 m Patient Age:    39 years     BP:           110/73 mmHg  Patient Gender: M            HR:           43 bpm. Exam Location:  Inpatient Procedure: 2D Echo                           MODIFIED REPORT: This report was modified by Jenkins Rouge MD on 04/18/2020 due to typo.  Indications:     794.31 abnormal ECG  History:         Patient has prior history of Echocardiogram examinations, most                  recent 11/08/2017. Coarctation of aorta repair.  Sonographer:     Jannett Celestine RDCS (AE) Referring Phys:  [id number] [place] Diagnosing Phys: Jenkins Rouge MD  Sonographer Comments: challenging windows IMPRESSIONS  1. Septal flattening suggesting Elevated PA pressures . Left ventricular ejection fraction, by estimation, is 45 to 50%. The left ventricle has mildly decreased function. The left ventricle demonstrates global hypokinesis. The left ventricular internal cavity size was mildly dilated. There is severe left ventricular hypertrophy. Left ventricular diastolic parameters are indeterminate.  2. Right ventricular systolic function is moderately reduced. The right ventricular size is moderately enlarged.  3. Left atrial size was moderately dilated.  4. The mitral valve is normal in structure. Mild mitral valve regurgitation. No evidence of mitral stenosis.  5. The aortic valve is normal in structure. Aortic valve regurgitation is mild. No aortic stenosis is present.  6. Pulmonic valve regurgitation is moderate.  7. The inferior vena cava Not seen well. FINDINGS  Left Ventricle: Septal flattening suggesting Elevated PA pressures. Left ventricular ejection fraction, by estimation, is 45 to 50%. The left ventricle has mildly decreased function. The left ventricle demonstrates global hypokinesis. Definity contrast agent was given IV to delineate the left ventricular endocardial borders. The left ventricular internal cavity size was mildly dilated. There is severe left ventricular hypertrophy. Left ventricular diastolic parameters are indeterminate. Right Ventricle: The  right ventricular size is moderately enlarged. Right vetricular wall thickness was not assessed. Right ventricular  systolic function is moderately reduced. Left Atrium: Left atrial size was moderately dilated. Right Atrium: Right atrial size was normal in size. Pericardium: There is no evidence of pericardial effusion. Mitral Valve: The mitral valve is normal in structure. There is mild thickening of the mitral valve leaflet(s). There is mild calcification of the mitral valve leaflet(s). Normal mobility of the mitral valve leaflets. Mild mitral valve regurgitation. No evidence of mitral valve stenosis. Tricuspid Valve: The tricuspid valve is normal in structure. Tricuspid valve regurgitation is trivial. No evidence of tricuspid stenosis. Aortic Valve: The aortic valve is normal in structure. Aortic valve regurgitation is mild. No aortic stenosis is present. Pulmonic Valve: The pulmonic valve was normal in structure. Pulmonic valve regurgitation is moderate. No evidence of pulmonic stenosis. Aorta: The aortic root is normal in size and structure. Venous: The inferior vena cava was not well visualized. The inferior vena cava Not seen well. IAS/Shunts: The interatrial septum was not well visualized.  LEFT VENTRICLE PLAX 2D LVIDd:         5.20 cm LVIDs:         4.00 cm LV PW:         1.30 cm LV IVS:        1.40 cm LVOT diam:     2.20 cm LV SV:         68 LV SV Index:   31 LVOT Area:     3.80 cm  RIGHT VENTRICLE RV S prime:     7.83 cm/s LEFT ATRIUM              Index LA diam:        4.90 cm  2.22 cm/m LA Vol (A2C):   109.0 ml 49.36 ml/m LA Vol (A4C):   85.2 ml  38.58 ml/m LA Biplane Vol: 98.6 ml  44.65 ml/m  AORTIC VALVE LVOT Vmax:   84.20 cm/s LVOT Vmean:  56.300 cm/s LVOT VTI:    0.180 m  AORTA Ao Root diam: 2.70 cm  SHUNTS Systemic VTI:  0.18 m Systemic Diam: 2.20 cm Charlton Haws MD Electronically signed by Charlton Haws MD Signature Date/Time: 04/18/2020/10:27:07 AM    Final (Updated)       Medications:      Scheduled Medications: . Chlorhexidine Gluconate Cloth  6 each Topical Daily  . enoxaparin (LOVENOX) injection  40 mg Subcutaneous Q24H  . furosemide  80 mg Intravenous BID  . gentamicin irrigation  80 mg Irrigation To SSTC  . potassium chloride  40 mEq Oral Daily  . sodium chloride flush  3 mL Intravenous Q12H  . sodium chloride flush  3 mL Intravenous Q12H     Infusions: . sodium chloride    . sodium chloride    . sodium chloride    . sodium chloride    .  ceFAZolin (ANCEF) IV    . dextrose 5 % and 0.9% NaCl Stopped (04/18/20 1446)  . milrinone    . piperacillin-tazobactam (ZOSYN)  IV 3.375 g (04/19/20 0513)     PRN Medications:  sodium chloride, sodium chloride, acetaminophen, nitroGLYCERIN, ondansetron (ZOFRAN) IV, sodium chloride flush, sodium chloride flush    Assessment/Plan   1. Biventricular HF with severe pulmonary HTN - unifying diagnosis remains unclear to me but suspect may be related to congenital heart disease. There is no evidence of infiltrative process or ongoing on MRI to tie together conduction diseae - no evidence of ongoing shunting by RHC. cMRA to be performed to better assess - aside  from GI symptoms he has had surprisingly few symptoms despite severe PH and very low output - Continue IV Lasix 80 mg bid today. supp K  - Start milrinone 0.125 mg/kg/min  - once left-sided pressures down will need addition of selective PA vasodilators and may need to start with IV prostanoids - Will need to assess for OSA and need for home O2 support - serologies pending  - I will d/w [facility] PAH and congenital teams   2. CHB - will need CRT.  - timing to be determined based on need for other procedures   3. GI symptoms - initially felt to be acute cholecystitis but suspect related to HF/ hepatic congestion - AST/ALT mildly elevated, 59 and 62 respectively  - he is on Zosyn for now and gen surgery also following   4. Renal Insuffiencey - SCr 1.44 (prior  baseline <1) - suspect cardiorenal  - plan inotropes + diuresis  - follow     Length of Stay: 2  Robbie Lis, PA-C  04/19/2020, 9:49 AM  Advanced Heart Failure Team Pager [phone] (M-F; 7a - 4p)  Please contact [facility] for night-coverage after hours (4p -7a ) and weekends on [email or web address]  Agree with above.  Results of RHC reviewed with him and his mother. He has had poor response to IV lasix despite markedly elevated PCWP. Extremities remain cool. + cough.   cMRI reviewed with Dr. Delton See. No obvious shunt.   General:  Lying nearly flat in bed. No resp difficulty + cough HEENT: normal Neck: supple. JVP to ear Carotids 2+ bilat; no bruits. No lymphadenopathy or thryomegaly appreciated. Cor: PMI nondisplaced. Huston Foley regular. +RV lift Lungs: clear Abdomen: obesesoft, nontender, nondistended. No hepatosplenomegaly. No bruits or masses. Good bowel sounds. Extremities: no cyanosis, clubbing, rash, 2+ edema cool Neuro: alert & orientedx3, cranial nerves grossly intact. moves all 4 extremities w/o difficulty. Affect pleasant  He is critically ill with the highest PA pressures I have ever seen  On top of CHB. Yet he remains only minimally symptomatic. He is not responding well to IV lasix.   Will add milrinone. Once better diuresed with begin careful addition of PAH-directed therapies starting with sildenafil. Will plan repeat RHC next week with iNO testing (40 ppm x 5 mins).   I discussed with [facility] and PAH teams today. If PA pressures remain very high and do not respond to oral theraph may be candidate for IV Flolan.   Would hold off on PPM for now if possible.   Will need to continue PAH work-up with serologies, VQ scan and eventual sleep study.   CRITICAL CARE Performed by: Arvilla Meres  Total critical care time: 45 minutes  Critical care time was exclusive of separately billable procedures and treating other patients.  Critical care was  necessary to treat or prevent imminent or life-threatening deterioration.  Critical care was time spent personally by me (independent of midlevel providers or residents) on the following activities: development of treatment plan with patient and/or surrogate as well as nursing, discussions with consultants, evaluation of patient's response to treatment, examination of patient, obtaining history from patient or surrogate, ordering and performing treatments and interventions, ordering and review of laboratory studies, ordering and review of radiographic studies, pulse oximetry and re-evaluation of patient's condition.  Arvilla Meres, MD  9:38 PM

## 2020-04-19 NOTE — Progress Notes (Signed)
Peripherally Inserted Central Catheter Placement  The IV Nurse has discussed with the patient and/or persons authorized to consent for the patient, the purpose of this procedure and the potential benefits and risks involved with this procedure.  The benefits include less needle sticks, lab draws from the catheter, and the patient may be discharged home with the catheter. Risks include, but not limited to, infection, bleeding, blood clot (thrombus formation), and puncture of an artery; nerve damage and irregular heartbeat and possibility to perform a PICC exchange if needed/ordered by physician.  Alternatives to this procedure were also discussed.  Bard Power PICC patient education guide, fact sheet on infection prevention and patient information card has been provided to patient /or left at bedside.    PICC Placement Documentation  PICC Double Lumen 04/19/20 PICC Right Cephalic 39 cm 0 cm (Active)  Indication for Insertion or Continuance of Line Vasoactive infusions 04/19/20 1441  Exposed Catheter (cm) 0 cm 04/19/20 1441  Site Assessment Clean;Dry;Intact 04/19/20 1441  Lumen #1 Status Flushed;Saline locked;Blood return noted 04/19/20 1441  Lumen #2 Status Flushed;Saline locked;Blood return noted 04/19/20 1441  Dressing Type Transparent 04/19/20 1441  Dressing Status Clean;Dry;Intact;Antimicrobial disc in place 04/19/20 1441  Dressing Intervention New dressing 04/19/20 1441  Dressing Change Due 04/26/20 04/19/20 1441       Ethelda Chick 04/19/2020, 2:42 PM

## 2020-04-19 NOTE — Progress Notes (Addendum)
Progress Note  Patient Name: Gabriel Floyd Date of Encounter: 04/19/2020  Primary Cardiologist: Dr. Bing Matter  Subjective   [employer] video interpretor Daphine Deutscher # [phone]  no complaints.   no CP, SOB, dizziness.  No abdominal pain at rest and now with palpation very minimal tenderness only remains at epigastrum.  He is feeling much better, is very hungry  Inpatient Medications    Scheduled Meds: . Chlorhexidine Gluconate Cloth  6 each Topical Daily  . enoxaparin (LOVENOX) injection  40 mg Subcutaneous Q24H  . furosemide  80 mg Intravenous BID  . gentamicin irrigation  80 mg Irrigation To SSTC  . potassium chloride  40 mEq Oral Daily  . potassium chloride  40 mEq Oral Once  . sodium chloride flush  3 mL Intravenous Q12H  . sodium chloride flush  3 mL Intravenous Q12H   Continuous Infusions: . sodium chloride    . sodium chloride    . sodium chloride    . sodium chloride    .  ceFAZolin (ANCEF) IV    . dextrose 5 % and 0.9% NaCl Stopped (04/18/20 1446)  . milrinone    . piperacillin-tazobactam (ZOSYN)  IV 3.375 g (04/19/20 0513)   PRN Meds: sodium chloride, sodium chloride, acetaminophen, nitroGLYCERIN, ondansetron (ZOFRAN) IV, sodium chloride flush, sodium chloride flush   Vital Signs    Vitals:   04/19/20 0500 04/19/20 0600 04/19/20 0800 04/19/20 0900  BP: 100/64 118/81  120/76  Pulse: (!) 40 (!) 43 (!) 45 (!) 44  Resp: 16 (!) 23 17   Temp:      TempSrc:      SpO2: 98% 96% 95% 96%  Weight: 106.1 kg     Height:        Intake/Output Summary (Last 24 hours) at 04/19/2020 1011 Last data filed at 04/19/2020 0800 Gross per 24 hour  Intake 841.42 ml  Output 1895 ml  Net -1053.58 ml   Last 3 Weights 04/19/2020 04/18/2020 11/18/2017  Weight (lbs) 233 lb 14.5 oz 233 lb 14.5 oz 251 lb 12.8 oz  Weight (kg) 106.1 kg 106.1 kg 114.216 kg      Telemetry    CHB, 40;'s - Personally Reviewed  ECG    No new EKGs - Personally Reviewed  Physical Exam   Unchanged from  yesterday GEN: No acute distress.   Neck: No JVD Cardiac: RRR, no murmurs, rubs, or gallops.  Respiratory: CTA. GI: Soft, mild tenderness moderate palpation of epigastrum, non-distended, nontender elsewhere MS: No edema; No deformity. Neuro:  Nonfocal  Psych: Normal affect   Labs    High Sensitivity Troponin:  No results for input(s): TROPONINIHS in the last 720 hours.    Chemistry Recent Labs  Lab 04/18/20 0238 04/18/20 0238 04/18/20 1702 04/18/20 1706 04/19/20 0701  NA 140   < > 144  142 145 140  K 3.8   < > 3.1*  3.4* 3.1* 3.6  CL 108  --   --   --  104  CO2 20*  --   --   --  26  GLUCOSE 155*  --   --   --  174*  BUN 26*  --   --   --  26*  CREATININE 1.34*  --   --   --  1.44*  CALCIUM 9.0  --   --   --  8.7*  PROT  --   --   --   --  6.0*  ALBUMIN  --   --   --   --  3.3*  AST  --   --   --   --  59*  ALT  --   --   --   --  62*  ALKPHOS  --   --   --   --  48  BILITOT  --   --   --   --  2.1*  GFRNONAA >60  --   --   --  >60  GFRAA >60  --   --   --  >60  ANIONGAP 12  --   --   --  10   < > = values in this interval not displayed.     Hematology Recent Labs  Lab 04/18/20 0238 04/18/20 0238 04/18/20 1702 04/18/20 1706 04/19/20 0701  WBC 11.3*  --   --   --  8.7  RBC 5.14  --   --   --  4.92  HGB 14.8   < > 13.9  14.3 13.6 14.2  HCT 45.2   < > 41.0  42.0 40.0 42.9  MCV 87.9  --   --   --  87.2  MCH 28.8  --   --   --  28.9  MCHC 32.7  --   --   --  33.1  RDW 13.8  --   --   --  13.7  PLT 190  --   --   --  177   < > = values in this interval not displayed.    BNP Recent Labs  Lab 04/18/20 0238  BNP 1,040.2*     DDimer No results for input(s): DDIMER in the last 168 hours.   Radiology     MR CARDIAC MORPHOLOGY W WO CONTRAST Result Date: 04/18/2020 CLINICAL DATA:  39 year old male with h/o repaired aortic coarctation, NICM, acute on chronic systolic and diastolic CHF and complete heart block. EXAM: CARDIAC MRI TECHNIQUE: The patient  was scanned on a 1.5 Tesla GE magnet. A dedicated cardiac coil was used. Functional imaging was done using Fiesta sequences. 2,3, and 4 chamber views were done to assess for RWMA's. Modified Simpson's rule using a short axis stack was used to calculate an ejection fraction on a dedicated work [employer]. The patient received 10 cc of Gadavist. After 10 minutes inversion recovery sequences were used to assess for infiltration and scar tissue. CONTRAST:  10 cc  of Gadavist FINDINGS: 1. Severely dilated left ventricle with moderate basal septal hypertrophy and moderately decreased systolic function (LVEF = 38%) with diffuse hypokinesis and no regional wall motion abnormalities. There is late gadolinium enhancement in the membranous portion of the interventricular septum - possibly representing prior VSD repair. No LGE is seen in the left ventricular myocardium. LVEDD: 78 mm LVESD: 65 mm LVEDV: 372 ml LVESV: 228 ml SV: 144 ml CO: 6.3 L/min Myocardial mass: 219 g 2. Moderately dilated right ventricle with significant hypertrabeculations and moderately decreased systolic function (RVEF = 31%). There are no regional wall motion abnormalities. 3.  Moderately dilated left and mildly dilated right atrium. 4. Normal size of the aortic root, ascending aorta. Severely dilated pulmonary artery measuring 45 mm consistent with pulmonary hypertension. 5.  Mild aortic, mitral and tricuspid regurgitation. 6.  Normal pericardium.  No pericardial effusion. IMPRESSION: 1. Severely dilated left ventricle with moderate basal septal hypertrophy and moderately decreased systolic function (LVEF = 38%) with diffuse hypokinesis and no regional wall motion abnormalities. There is late gadolinium enhancement in the membranous portion of the interventricular  septum - possibly representing prior VSD repair. No LGE is seen in the left ventricular myocardium. 2. Moderately dilated right ventricle with significant  hypertrabeculations and moderately decreased systolic function (RVEF = 31%). There are no regional wall motion abnormalities. 3.  Moderately dilated left and mildly dilated right atrium. 4. Normal size of the aortic root, ascending aorta. Severely dilated pulmonary artery measuring 45 mm consistent with pulmonary hypertension. 5.  Mild aortic, mitral and tricuspid regurgitation. 6.  Normal pericardium.  No pericardial effusion. MRA for evaluation of aortic coarctation will be performed on 04/19/2020. Electronically Signed   By: Ena Dawley   On: 04/18/2020 17:38     Cardiac Studies   04/18/2020: RHC Findings: RA = 15 prominent v-waves RV = 132/17 PA =  132/52 (77) PCW = 31 Fick cardiac output/index = 2.7/1.35 PVR = 17.3 WU FA sat = 99% PA sat = 49%, 51% SVC sat = 54%  Assessment: 1. Very severe mixed pulmonary HTN 2. Severely reduced CO in setting of cor pulmonale  3. No evidence of intracardiac shunting  Plan/Discussion: Will diurese. Await MRI. Will d/w [facility] and congenital heart teams. May need IV therapies.     Cardiac MRI below    04/18/2020: TTE IMPRESSIONS  1. Septal flattening suggesting Elevated PA pressures . Left ventricular  ejection fraction, by estimation, is 45 to 50%. The left ventricle has  mildly decreased function. The left ventricle demonstrates global  hypokinesis. The left ventricular internal  cavity size was mildly dilated. There is severe left ventricular  hypertrophy. Left ventricular diastolic parameters are indeterminate.  2. Right ventricular systolic function is moderately reduced. The right  ventricular size is moderately enlarged.  3. Left atrial size was moderately dilated.  4. The mitral valve is normal in structure. Mild mitral valve  regurgitation. No evidence of mitral stenosis.  5. The aortic valve is normal in structure. Aortic valve regurgitation is  mild. No aortic stenosis is present.  6. Pulmonic valve regurgitation  is moderate.  7. The inferior vena cava Not seen well.   FINDINGS  Left Ventricle: Septal flattening suggesting Elevated PA pressures. Left  ventricular ejection fraction, by estimation, is 45 to 50%. The left  ventricle has mildly decreased function. The left ventricle demonstrates  global hypokinesis. Definity contrast  agent was given IV to delineate the left ventricular endocardial borders.  The left ventricular internal cavity size was mildly dilated. There is  severe left ventricular hypertrophy. Left ventricular diastolic parameters  are indeterminate.   Right Ventricle: The right ventricular size is moderately enlarged. Right  vetricular wall thickness was not assessed. Right ventricular systolic  function is moderately reduced.   Left Atrium: Left atrial size was moderately dilated.   Right Atrium: Right atrial size was normal in size.   Pericardium: There is no evidence of pericardial effusion.   Mitral Valve: The mitral valve is normal in structure. There is mild  thickening of the mitral valve leaflet(s). There is mild calcification of  the mitral valve leaflet(s). Normal mobility of the mitral valve leaflets.  Mild mitral valve regurgitation. No  evidence of mitral valve stenosis.   Tricuspid Valve: The tricuspid valve is normal in structure. Tricuspid  valve regurgitation is trivial. No evidence of tricuspid stenosis.   Aortic Valve: The aortic valve is normal in structure. Aortic valve  regurgitation is mild. No aortic stenosis is present.   Pulmonic Valve: The pulmonic valve was normal in structure. Pulmonic valve  regurgitation is moderate. No evidence  of pulmonic stenosis.   Aorta: The aortic root is normal in size and structure.   Venous: The inferior vena cava was not well visualized. The inferior vena  cava Not seen well.   IAS/Shunts: The interatrial septum was not well visualized.     LEFT VENTRICLE  PLAX 2D  LVIDd:     5.20 cm  LVIDs:      4.00 cm  LV PW:     1.30 cm  LV IVS:    1.40 cm  LVOT diam:   2.20 cm  LV SV:     68  LV SV Index:  31  LVOT Area:   3.80 cm     RIGHT VENTRICLE  RV S prime:   7.83 cm/s   LEFT ATRIUM       Index  LA diam:    4.90 cm 2.22 cm/m  LA Vol (A2C):  109.0 ml 49.36 ml/m  LA Vol (A4C):  85.2 ml 38.58 ml/m  LA Biplane Vol: 98.6 ml 44.65 ml/m  AORTIC VALVE  LVOT Vmax:  84.20 cm/s  LVOT Vmean: 56.300 cm/s  LVOT VTI:  0.180 m    AORTA  Ao Root diam: 2.70 cm     SHUNTS  Systemic VTI: 0.18 m  Systemic Diam: 2.20 cm    Patient Profile     39 y.o. male with PMHx of (in review of Care everywhere)  Aortic coarctation, s/p surgical repair 41 (age 47), with residual ascending/descending thoracic aortic dilation on imaging 10/2017, and moderate AI, Ventricular septal defect (small by notes) and subaortic membrane, also repaired in 1987, HFpEF, with hypertrophic cardiomyopathy, denies history of hypertension, Diabetes versus pre-diabetes, and diastolic CHF, ?p.HTN  Echo>> LV dysfunction-moderate  RV pressures elevated  RHC  PApressuer 165  Resistance 17 Woods units  RA pressure> 15  cMRI  RVE and RV dysfunction  LVE-severe,( 65/12mm)  EF 38%  LGE neg x at site of VSD repair Assessment & Plan     1. CHB     Asymptomatic     No nodal blockers at home or here     BP remains stable  No device yet pending clinical course and ongoing findings  2. Presumed NICM 3. Congenital heart disease     Aortic coarctation, s/p surgical repair 1987 (age 47), with residual ascending/descending thoracic aortic dilation on imaging 10/2017, and moderate AI, Ventricular septal defect (small by notes) and subaortic membrane, also repaired in 1987 4. Pulmonary HTN 5. Presumed NICM     Biventricular failure     Low output  Appreciate HF team, will stop his IVF  Started on lasix, plans for milrinone today Creat creeping up   In d/w Dr.  Gala Romney, so far in his conversations with congential team folks at [facility] so far concur with treatment and plan here, do not suspect any residual or uncorrected congenital issue,  no current plan to transfer though will have ongoing discussions as pt's clinical course progresses.  Pending cardiac MRA            3. Abd pain     Acute GB, seems less likely hepatic congestion     He is nontoxic appearing, afebrile WBC 11.3 >> 8.7, no active pain currently at rest     no plans for drain     Remains NPO     On Zosyn     Very mild tenderness only to day to palpation       Note from yesterday by surgery,  recommends lap-chole once cardiac  Issues are stabilized. Today: In the interim, antibiotic therapy is an appropriate management strategy and zosyn should be continued (day 3 of therapy). May transition to oral regimen with augmentin to complete a 7 day course of therapy. May follow up as outpatient with me in 4 weeks.   I will discuss with Dr. Graciela Husbands, perhaps try clears today  AHF suspects low output failure as etiology of GI symptoms      For questions or updates, please contact [facility] Please consult [email or web address] for contact info under        Signed, [name], PA-C  04/19/2020, 10:11 AM   As above Lengthy discussion with pt family about the severity of pulm htn and DrDB would like [place] to hold off on treating CHB for now given the likely need for repeated RHC as we try to reduce pulm pressures  Will advance diet and see how he does  Await MRI   Not sure I understand the cause of LVE; volume overload would make the most sense, but not seen. Is this a dilated myopathy,  How wide is Teacher, English as a foreign language

## 2020-04-19 NOTE — Progress Notes (Signed)
Trauma/Critical Care Follow Up Note  Subjective:    Overnight Issues:   Objective:  Vital signs for last 24 hours: Temp:  [98 F (36.7 C)-98.4 F (36.9 C)] 98.4 F (36.9 C) (05/20 0330) Pulse Rate:  [0-77] 44 (05/20 0900) Resp:  [0-31] 17 (05/20 0800) BP: (90-137)/(59-102) 120/76 (05/20 0900) SpO2:  [0 %-100 %] 96 % (05/20 0900) Weight:  [106.1 kg] 106.1 kg (05/20 0500)  Hemodynamic parameters for last 24 hours:    Intake/Output from previous day: 05/19 0701 - 05/20 0700 In: 779 [P.O.:720; I.V.:6; IV Piggyback:53] Out: 2095 [Urine:2095]  Intake/Output this shift: Total I/O In: 84.6 [IV Piggyback:84.6] Out: -   Vent settings for last 24 hours:    Physical Exam:  Gen: comfortable, no distress, non-toxic Neuro: non-focal exam HEENT: PERRL Neck: supple CV: RRR Pulm: unlabored breathing Abd: soft, focal epigastric TTP, no rebound/guarding GU: clear yellow urine Extr: wwp, no edema   Results for orders placed or performed during the hospital encounter of 04/17/20 (from the past 24 hour(s))  POCT I-Stat EG7     Status: Abnormal   Collection Time: 04/18/20  5:02 PM  Result Value Ref Range   pH, Ven 7.361 7.250 - 7.430   pCO2, Ven 46.7 44.0 - 60.0 mmHg   pO2, Ven 28.0 (LL) 32.0 - 45.0 mmHg   Bicarbonate 26.4 20.0 - 28.0 mmol/L   TCO2 28 22 - 32 mmol/L   O2 Saturation 49.0 %   Acid-Base Excess 0.0 0.0 - 2.0 mmol/L   Sodium 142 135 - 145 mmol/L   Potassium 3.4 (L) 3.5 - 5.1 mmol/L   Calcium, Ion 1.21 1.15 - 1.40 mmol/L   HCT 42.0 39.0 - 52.0 %   Hemoglobin 14.3 13.0 - 17.0 g/dL   Sample type VENOUS   POCT I-Stat EG7     Status: Abnormal   Collection Time: 04/18/20  5:02 PM  Result Value Ref Range   pH, Ven 7.369 7.250 - 7.430   pCO2, Ven 44.0 44.0 - 60.0 mmHg   pO2, Ven 28.0 (LL) 32.0 - 45.0 mmHg   Bicarbonate 25.4 20.0 - 28.0 mmol/L   TCO2 27 22 - 32 mmol/L   O2 Saturation 51.0 %   Acid-Base Excess 0.0 0.0 - 2.0 mmol/L   Sodium 144 135 - 145 mmol/L   Potassium 3.1 (L) 3.5 - 5.1 mmol/L   Calcium, Ion 1.10 (L) 1.15 - 1.40 mmol/L   HCT 41.0 39.0 - 52.0 %   Hemoglobin 13.9 13.0 - 17.0 g/dL   Sample type VENOUS   POCT I-Stat EG7     Status: Abnormal   Collection Time: 04/18/20  5:06 PM  Result Value Ref Range   pH, Ven 7.357 7.250 - 7.430   pCO2, Ven 44.7 44.0 - 60.0 mmHg   pO2, Ven 31.0 (LL) 32.0 - 45.0 mmHg   Bicarbonate 25.1 20.0 - 28.0 mmol/L   TCO2 26 22 - 32 mmol/L   O2 Saturation 57.0 %   Acid-base deficit 1.0 0.0 - 2.0 mmol/L   Sodium 145 135 - 145 mmol/L   Potassium 3.1 (L) 3.5 - 5.1 mmol/L   Calcium, Ion 1.06 (L) 1.15 - 1.40 mmol/L   HCT 40.0 39.0 - 52.0 %   Hemoglobin 13.6 13.0 - 17.0 g/dL   Sample type VENOUS   Surgical PCR screen     Status: None   Collection Time: 04/19/20  5:16 AM   Specimen: Nasal Mucosa; Nasal Swab  Result Value Ref Range   MRSA,  PCR NEGATIVE NEGATIVE   Staphylococcus aureus NEGATIVE NEGATIVE  Sedimentation rate     Status: None   Collection Time: 04/19/20  7:01 AM  Result Value Ref Range   Sed Rate 4 0 - 16 mm/hr  Comprehensive metabolic panel     Status: Abnormal   Collection Time: 04/19/20  7:01 AM  Result Value Ref Range   Sodium 140 135 - 145 mmol/L   Potassium 3.6 3.5 - 5.1 mmol/L   Chloride 104 98 - 111 mmol/L   CO2 26 22 - 32 mmol/L   Glucose, Bld 174 (H) 70 - 99 mg/dL   BUN 26 (H) 6 - 20 mg/dL   Creatinine, Ser 1.44 (H) 0.61 - 1.24 mg/dL   Calcium 8.7 (L) 8.9 - 10.3 mg/dL   Total Protein 6.0 (L) 6.5 - 8.1 g/dL   Albumin 3.3 (L) 3.5 - 5.0 g/dL   AST 59 (H) 15 - 41 U/L   ALT 62 (H) 0 - 44 U/L   Alkaline Phosphatase 48 38 - 126 U/L   Total Bilirubin 2.1 (H) 0.3 - 1.2 mg/dL   GFR calc non Af Amer >60 >60 mL/min   GFR calc Af Amer >60 >60 mL/min   Anion gap 10 5 - 15  CBC     Status: None   Collection Time: 04/19/20  7:01 AM  Result Value Ref Range   WBC 8.7 4.0 - 10.5 K/uL   RBC 4.92 4.22 - 5.81 MIL/uL   Hemoglobin 14.2 13.0 - 17.0 g/dL   HCT 42.9 39.0 - 52.0 %   MCV 87.2  80.0 - 100.0 fL   MCH 28.9 26.0 - 34.0 pg   MCHC 33.1 30.0 - 36.0 g/dL   RDW 13.7 11.5 - 15.5 %   Platelets 177 150 - 400 K/uL   nRBC 0.0 0.0 - 0.2 %    Assessment & Plan:  Present on Admission: . Complete heart block ([place])    LOS: 2 days   Additional comments:I reviewed the patient's new clinical lab test results.   and I reviewed the patients new imaging test results.    19M with acute vs acute on chronic cholecystitis and complicated cardiac hx  3rd degree heart block - EP eval pending for tem vs perm pacemaker Cholecystitis - non-toxic, on abx, WBC normalized. Would recommend cholecystectomy preferentially for definitive management if cardiac concerns can be addressed/optimized. Discussed with EP this AM and plan for transfer to [facility] for further management/intervention of heart block and significant PAH. In the interim, antibiotic therapy is an appropriate management strategy and zosyn should be continued (day 3 of therapy). May transition to oral regimen with augmentin to complete a 7 day course of therapy. May follow up as outpatient with me in 4 weeks.  CHF - needs to be optimized AI, CM, Pulm HTN - significant PAH needs optimization OSA  Jesusita Oka, MD Trauma & General Surgery Please use [email or web address] to contact on call provider  04/19/2020  *Care during the described time interval was provided by me. I have reviewed this patient's available data, including medical history, events of note, physical examination and test results as part of my evaluation.

## 2020-04-20 DIAGNOSIS — I2729 Other secondary pulmonary hypertension: Secondary | ICD-10-CM

## 2020-04-20 LAB — COOXEMETRY PANEL
Carboxyhemoglobin: 1.3 % (ref 0.5–1.5)
Methemoglobin: 0.6 % (ref 0.0–1.5)
O2 Saturation: 64.6 %
Total hemoglobin: 12.7 g/dL (ref 12.0–16.0)

## 2020-04-20 LAB — ENA+DNA/DS+ANTICH+CENTRO+JO...
Anti JO-1: <0.2 AI (ref 0.0–0.9)
Centromere Ab Screen: <0.2 AI (ref 0.0–0.9)
Chromatin Ab SerPl-aCnc: <0.2 AI (ref 0.0–0.9)
ENA SM Ab Ser-aCnc: <0.2 AI (ref 0.0–0.9)
Ribonucleic Protein: >8 AI — ABNORMAL HIGH (ref 0.0–0.9)
SSA (Ro) (ENA) Antibody, IgG: <0.2 AI (ref 0.0–0.9)
SSB (La) (ENA) Antibody, IgG: <0.2 AI (ref 0.0–0.9)
Scleroderma (Scl-70) (ENA) Antibody, IgG: <0.2 AI (ref 0.0–0.9)
ds DNA Ab: 2 IU/mL (ref 0–9)

## 2020-04-20 LAB — COMPREHENSIVE METABOLIC PANEL
ALT: 61 U/L — ABNORMAL HIGH (ref 0–44)
AST: 53 U/L — ABNORMAL HIGH (ref 15–41)
Albumin: 2.9 g/dL — ABNORMAL LOW (ref 3.5–5.0)
Alkaline Phosphatase: 44 U/L (ref 38–126)
Anion gap: 9 (ref 5–15)
BUN: 19 mg/dL (ref 6–20)
CO2: 27 mmol/L (ref 22–32)
Calcium: 7.9 mg/dL — ABNORMAL LOW (ref 8.9–10.3)
Chloride: 103 mmol/L (ref 98–111)
Creatinine, Ser: 1.14 mg/dL (ref 0.61–1.24)
GFR calc Af Amer: >60 mL/min (ref >60)
GFR calc non Af Amer: >60 mL/min (ref >60)
Glucose, Bld: 145 mg/dL — ABNORMAL HIGH (ref 70–99)
Potassium: 3 mmol/L — ABNORMAL LOW (ref 3.5–5.1)
Sodium: 139 mmol/L (ref 135–145)
Total Bilirubin: 1.7 mg/dL — ABNORMAL HIGH (ref 0.3–1.2)
Total Protein: 5.4 g/dL — ABNORMAL LOW (ref 6.5–8.1)

## 2020-04-20 LAB — MAGNESIUM: Magnesium: 1.3 mg/dL — ABNORMAL LOW (ref 1.7–2.4)

## 2020-04-20 LAB — ANCA TITERS
Atypical P-ANCA titer: <1:20 {titer}
C-ANCA: <1:20 {titer}
P-ANCA: <1:20 {titer}

## 2020-04-20 LAB — ANTI-SCLERODERMA ANTIBODY: Scleroderma (Scl-70) (ENA) Antibody, IgG: <0.2 AI (ref 0.0–0.9)

## 2020-04-20 LAB — ANA W/REFLEX IF POSITIVE: Anti Nuclear Antibody (ANA): POSITIVE — AB

## 2020-04-20 LAB — RHEUMATOID FACTOR: Rheumatoid fact SerPl-aCnc: 14 IU/mL — ABNORMAL HIGH (ref 0.0–13.9)

## 2020-04-20 MED ORDER — POTASSIUM CHLORIDE CRYS ER 20 MEQ PO TBCR
60.0000 meq | EXTENDED_RELEASE_TABLET | Freq: Once | ORAL | Status: AC
  Administered 2020-04-20: 60 meq via ORAL
  Filled 2020-04-20: qty 3

## 2020-04-20 MED ORDER — MAGNESIUM SULFATE 4 GM/100ML IV SOLN
4.0000 g | Freq: Once | INTRAVENOUS | Status: AC
  Administered 2020-04-20: 4 g via INTRAVENOUS
  Filled 2020-04-20: qty 100

## 2020-04-20 NOTE — Progress Notes (Addendum)
Advanced Heart Failure Rounding Note  PCP-Cardiologist: No primary care provider on file.    Patient Profile   Gabriel Floyd is a 39 y.o. 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   Admitted to [facility] 5/21 for acute cholecystitis and found to be in CHB=>>transfered to [facility] for potential PPM.   Echo EF 45-50% with severe LVH. RV dilated and moderate HK with severe septal flattening c/w RV pressure volume overload LV PW 1.30 cm LV IVS1.40 cm   Subquent RHC showed very severe mixed PH with severely reduce CO (CI 1.6) and no evidence of shunting  RHC 04/18/20  1. Very severe mixed pulmonary HTN 2. Severely reduced CO in setting of cor pulmonale  3. No evidence of intracardiac shunting  5/20 Started on milrinone.    Subjective:   Yesterday started on milrinone 0.125 mcg. CO-OX 65%.   Diuresed with IV lasix. Brisk diuresis noted.   Denies SOB. Denies dizziness.    Objective:   Weight Range: 106.1 kg Body mass index is 34.54 kg/m.   Vital Signs:   Temp:  [97.9 F (36.6 C)-98.4 F (36.9 C)] 98.4 F (36.9 C) (05/21 0741) Pulse Rate:  [42-93] 51 (05/21 1000) Resp:  [14-25] 15 (05/21 1000) BP: (90-129)/(59-95) 122/95 (05/21 1000) SpO2:  [83 %-98 %] 96 % (05/21 1000) Last BM Date: 04/20/20  Weight change: Filed Weights   04/18/20 0500 04/19/20 0500  Weight: 106.1 kg 106.1 kg    Intake/Output:   Intake/Output Summary (Last 24 hours) at 04/20/2020  1022 Last data filed at 04/20/2020 1000 Gross per 24 hour  Intake 480.31 ml  Output 4225 ml  Net -3744.69 ml      Physical Exam   CVP 14  General:  Sitting on the side of the bed.  No resp difficulty HEENT: normal Neck: supple. JVP 11-12 . Carotids 2+ bilat; no bruits. No lymphadenopathy or thryomegaly appreciated. Cor: PMI nondisplaced. Regular rate & rhythm. No rubs, gallops or murmurs. Lungs: clear Abdomen: soft, nontender, nondistended. No hepatosplenomegaly. No bruits or masses. Good bowel sounds. Extremities: no cyanosis, clubbing, rash, edema Neuro: alert & orientedx3, cranial nerves grossly intact. moves all 4 extremities w/o difficulty. Affect pleasant   Telemetry  CHB 50s   Labs    CBC Recent Labs    04/18/20 0238 04/18/20 1702 04/18/20 1706 04/19/20 0701  WBC 11.3*  --   --  8.7  HGB 14.8   < > 13.6 14.2  HCT 45.2   < > 40.0 42.9  MCV 87.9  --   --  87.2  PLT 190  --   --  177   < > = values in this interval not displayed.   Basic Metabolic Panel Recent Labs    04/19/20 0701 04/20/20 0650  NA 140 139  K 3.6 3.0*  CL 104 103  CO2 26 27  GLUCOSE 174* 145*  BUN 26* 19  CREATININE 1.44* 1.14  CALCIUM 8.7* 7.9*   Liver Function Tests Recent Labs  04/19/20 0701 04/20/20 0650  AST 59* 53*  ALT 62* 61*  ALKPHOS 48 44  BILITOT 2.1* 1.7*  PROT 6.0* 5.4*  ALBUMIN 3.3* 2.9*   No results for input(s): LIPASE, AMYLASE in the last 72 hours. Cardiac Enzymes No results for input(s): CKTOTAL, CKMB, CKMBINDEX, TROPONINI in the last 72 hours.  BNP: BNP (last 3 results) Recent Labs    04/18/20 0238  BNP 1,040.2*    ProBNP (last 3 results) No results for input(s): PROBNP in the last 8760 hours.   D-Dimer No results for input(s): DDIMER in the last 72 hours. Hemoglobin A1C No results for input(s): HGBA1C in the last 72 hours. Fasting Lipid Panel No results for input(s): CHOL, HDL, LDLCALC, TRIG, CHOLHDL, LDLDIRECT in the last 72  hours. Thyroid Function Tests No results for input(s): TSH, T4TOTAL, T3FREE, THYROIDAB in the last 72 hours.  Invalid input(s): FREET3  Other results:   Imaging    DG CHEST PORT 1 VIEW  Result Date: 04/19/2020 CLINICAL DATA:  PICC placement. EXAM: PORTABLE CHEST 1 VIEW COMPARISON:  None. FINDINGS: Mild-to-moderate cardiomegaly is noted. No pneumothorax or pleural effusion is noted. Right-sided PICC line is noted with distal tip in expected position of the SVC. No acute pulmonary disease is noted. Bony thorax is unremarkable. IMPRESSION: Right-sided PICC line tip seen in expected position of the SVC. Mild to moderate cardiomegaly is noted. No acute cardiopulmonary abnormality seen. Electronically Signed   By: Lupita Raider M.D.   On: 04/19/2020 15:02     Medications:     Scheduled Medications: . Chlorhexidine Gluconate Cloth  6 each Topical Daily  . enoxaparin (LOVENOX) injection  40 mg Subcutaneous Q24H  . furosemide  80 mg Intravenous BID  . potassium chloride  40 mEq Oral Daily  . potassium chloride  60 mEq Oral Once  . sodium chloride flush  10-40 mL Intracatheter Q12H  . sodium chloride flush  3 mL Intravenous Q12H    Infusions: . sodium chloride    . dextrose 5 % and 0.9% NaCl Stopped (04/18/20 1446)  . milrinone 0.125 mcg/kg/min (04/20/20 1000)  . piperacillin-tazobactam (ZOSYN)  IV 3.375 g (04/20/20 0648)    PRN Medications: sodium chloride, acetaminophen, nitroGLYCERIN, ondansetron (ZOFRAN) IV, sodium chloride flush, sodium chloride flush, sodium chloride flush    Assessment/Plan   1. Biventricular HF with severe pulmonary HTN - unifying diagnosis remains unclear to me but suspect may be related to congenital heart disease. There is no evidence of infiltrative process or ongoing on MRI to tie together conduction diseae - no evidence of ongoing shunting by RHC. cMRA to be performed to better assess - aside from GI symptoms he has had surprisingly few symptoms  despite severe PH and very low output - Volume status improving.  Continue IV Lasix 80 mg bid today. supp K  - Continue  milrinone 0.125 mg/kg/min. Consider adding sildenafil once diuresed better.  - Will need to assess for OSA and need for home O2 support - serologies pending  - Dr Gala Romney  d/w [facility] PAH and congenital teams  - Repeat RHC next week.   2. CHB - will need CRT.  - timing to be determined based on need for other procedures - EP following.   3. GI symptoms - initially felt to be acute cholecystitis but suspect related to HF/ hepatic congestion - AST/ALT continue to trend dow.   - he is on Zosyn for now and gen surgery also following   4. Renal Insuffiencey -  SCr 1.44 peaked, today down to 1.14  - suspect cardiorenal   5. Hypokalemia  K 3. Supp K.   Exam/Plan discussed with patient and Mom -->Interpreter [phone]   Length of Stay: 3  Amy Clegg, NP  04/20/2020, 10:22 AM  Advanced Heart Failure Team Pager [phone] (M-F; 7a - 4p)  Please contact [facility] for night-coverage after hours (4p -7a ) and weekends on [email or web address]  Agree with above.   Remains on milrinone for severe PAH/cor pulmonale with cardiogenic shock. Diuresing fairly weill. Co-ox improved. Renal function now back to baseline. Remains in complete heart block. BP ok.   General:  Obese male sitting up in bed.  HEENT: normal Neck: supple. JVP to jaw Carotids 2+ bilat; no bruits. No lymphadenopathy or thryomegaly appreciated. Cor: PMI nondisplaced. Huston Foley regular  Lungs: clear Abdomen: soft, nontender, nondistended. No hepatosplenomegaly. No bruits or masses. Good bowel sounds. Extremities: no cyanosis, clubbing, rash, 1+ edema Neuro: alert & orientedx3, cranial nerves grossly intact. moves all 4 extremities w/o difficulty. Affect pleasant  Will continue milrinone and IV diuresis. PH serologies are negative. Start sildenafil carefully at 20 tid. Will need VQ scan. Will plan to repeat RHC early  next week. May need IV therapies for PAH. Pacer prior to d/c.   CRITICAL CARE Performed by: Arvilla Meres  Total critical care time: 35 minutes  Critical care time was exclusive of separately billable procedures and treating other patients.  Critical care was necessary to treat or prevent imminent or life-threatening deterioration.  Critical care was time spent personally by me (independent of midlevel providers or residents) on the following activities: development of treatment plan with patient and/or surrogate as well as nursing, discussions with consultants, evaluation of patient's response to treatment, examination of patient, obtaining history from patient or surrogate, ordering and performing treatments and interventions, ordering and review of laboratory studies, ordering and review of radiographic studies, pulse oximetry and re-evaluation of patient's condition.   Arvilla Meres, MD  8:32 PM    .

## 2020-04-20 NOTE — Progress Notes (Signed)
Progress Note  Patient Name: Gabriel Floyd Date of Encounter: 04/20/2020  Primary Cardiologist: Dr. Bing Matter  Subjective    Less sob   Inpatient Medications    Scheduled Meds:  Chlorhexidine Gluconate Cloth  6 each Topical Daily   enoxaparin (LOVENOX) injection  40 mg Subcutaneous Q24H   furosemide  80 mg Intravenous BID   potassium chloride  40 mEq Oral Daily   potassium chloride  60 mEq Oral Once   sodium chloride flush  10-40 mL Intracatheter Q12H   sodium chloride flush  3 mL Intravenous Q12H   Continuous Infusions:  sodium chloride     dextrose 5 % and 0.9% NaCl Stopped (04/18/20 1446)   magnesium sulfate bolus IVPB 4 g (04/20/20 1140)   milrinone 0.125 mcg/kg/min (04/20/20 1100)   piperacillin-tazobactam (ZOSYN)  IV 3.375 g (04/20/20 0648)   PRN Meds: sodium chloride, acetaminophen, nitroGLYCERIN, ondansetron (ZOFRAN) IV, sodium chloride flush, sodium chloride flush, sodium chloride flush   Vital Signs    Vitals:   04/20/20 1000 04/20/20 1040 04/20/20 1100 04/20/20 1104  BP: (!) 122/95  109/73   Pulse: (!) 51  (!) 46   Resp: 15  16   Temp:    98.4 F (36.9 C)  TempSrc:      SpO2: 96%  93%   Weight:  105.6 kg    Height:        Intake/Output Summary (Last 24 hours) at 04/20/2020 1321 Last data filed at 04/20/2020 1144 Gross per 24 hour  Intake 1182.31 ml  Output 4775 ml  Net -3592.69 ml   Last 3 Weights 04/20/2020 04/19/2020 04/18/2020  Weight (lbs) 232 lb 12.9 oz 233 lb 14.5 oz 233 lb 14.5 oz  Weight (kg) 105.6 kg 106.1 kg 106.1 kg      Telemetry    Personally reviewed  *CHB   ECG    No new EKGs - Personally Reviewed  Physical Exam  Well developed and nourished in no acute distress HENT normal Neck supple with JVP-   Clear Regular rate and rhythm, no murmurs or gallops Abd-soft with active BS No Clubbing cyanosis edema Skin-warm and dry A & Oriented  Grossly normal sensory and motor function      Labs    High Sensitivity Troponin:   No results for input(s): TROPONINIHS in the last 720 hours.    Chemistry Recent Labs  Lab 04/18/20 0238 04/18/20 1702 04/18/20 1706 04/19/20 0701 04/20/20 0650  NA 140   < > 145 140 139  K 3.8   < > 3.1* 3.6 3.0*  CL 108  --   --  104 103  CO2 20*  --   --  26 27  GLUCOSE 155*  --   --  174* 145*  BUN 26*  --   --  26* 19  CREATININE 1.34*  --   --  1.44* 1.14  CALCIUM 9.0  --   --  8.7* 7.9*  PROT  --   --   --  6.0* 5.4*  ALBUMIN  --   --   --  3.3* 2.9*  AST  --   --   --  59* 53*  ALT  --   --   --  62* 61*  ALKPHOS  --   --   --  48 44  BILITOT  --   --   --  2.1* 1.7*  GFRNONAA >60  --   --  >60 >60  GFRAA >60  --   --  >  60 >60  ANIONGAP 12  --   --  10 9   < > = values in this interval not displayed.     Hematology Recent Labs  Lab 04/18/20 0238 04/18/20 0238 04/18/20 1702 04/18/20 1706 04/19/20 0701  WBC 11.3*  --   --   --  8.7  RBC 5.14  --   --   --  4.92  HGB 14.8   < > 13.9   14.3 13.6 14.2  HCT 45.2   < > 41.0   42.0 40.0 42.9  MCV 87.9  --   --   --  87.2  MCH 28.8  --   --   --  28.9  MCHC 32.7  --   --   --  33.1  RDW 13.8  --   --   --  13.7  PLT 190  --   --   --  177   < > = values in this interval not displayed.    BNP Recent Labs  Lab 04/18/20 0238  BNP 1,040.2*     DDimer No results for input(s): DDIMER in the last 168 hours.   Radiology     MR CARDIAC MORPHOLOGY W WO CONTRAST Result Date: 04/18/2020 CLINICAL DATA:  39 year old male with h/o repaired aortic coarctation, NICM, acute on chronic systolic and diastolic CHF and complete heart block. EXAM: CARDIAC MRI TECHNIQUE: The patient was scanned on a 1.5 Tesla GE magnet. A dedicated cardiac coil was used. Functional imaging was done using Fiesta sequences. 2,3, and 4 chamber views were done to assess for RWMA's. Modified Simpson's rule using a short axis stack was used to calculate an ejection fraction on a dedicated work [employer]. The patient received 10 cc  of Gadavist. After 10 minutes inversion recovery sequences were used to assess for infiltration and scar tissue. CONTRAST:  10 cc  of Gadavist FINDINGS: 1. Severely dilated left ventricle with moderate basal septal hypertrophy and moderately decreased systolic function (LVEF = 38%) with diffuse hypokinesis and no regional wall motion abnormalities. There is late gadolinium enhancement in the membranous portion of the interventricular septum - possibly representing prior VSD repair. No LGE is seen in the left ventricular myocardium. LVEDD: 78 mm LVESD: 65 mm LVEDV: 372 ml LVESV: 228 ml SV: 144 ml CO: 6.3 L/min Myocardial mass: 219 g 2. Moderately dilated right ventricle with significant hypertrabeculations and moderately decreased systolic function (RVEF = 31%). There are no regional wall motion abnormalities. 3.  Moderately dilated left and mildly dilated right atrium. 4. Normal size of the aortic root, ascending aorta. Severely dilated pulmonary artery measuring 45 mm consistent with pulmonary hypertension. 5.  Mild aortic, mitral and tricuspid regurgitation. 6.  Normal pericardium.  No pericardial effusion. IMPRESSION: 1. Severely dilated left ventricle with moderate basal septal hypertrophy and moderately decreased systolic function (LVEF = 38%) with diffuse hypokinesis and no regional wall motion abnormalities. There is late gadolinium enhancement in the membranous portion of the interventricular septum - possibly representing prior VSD repair. No LGE is seen in the left ventricular myocardium. 2. Moderately dilated right ventricle with significant hypertrabeculations and moderately decreased systolic function (RVEF = 31%). There are no regional wall motion abnormalities. 3.  Moderately dilated left and mildly dilated right atrium. 4. Normal size of the aortic root, ascending aorta. Severely dilated pulmonary artery measuring 45 mm consistent with pulmonary hypertension. 5.  Mild aortic, mitral and tricuspid  regurgitation. 6.  Normal pericardium.  No pericardial  effusion. MRA for evaluation of aortic coarctation will be performed on 04/19/2020. Electronically Signed   By: Tobias Alexander   On: 04/18/2020 17:38     Cardiac Studies   04/18/2020: RHC Findings: RA = 15 prominent v-waves RV = 132/17 PA =  132/52 (77) PCW = 31 Fick cardiac output/index = 2.7/1.35 PVR = 17.3 WU FA sat = 99% PA sat = 49%, 51% SVC sat = 54%   Assessment: 1. Very severe mixed pulmonary HTN 2. Severely reduced CO in setting of cor pulmonale  3. No evidence of intracardiac shunting   Plan/Discussion: Will diurese. Await MRI. Will d/w [facility] PAH and congenital heart teams. May need IV therapies.     Cardiac MRI below    04/18/2020: TTE IMPRESSIONS  1. Septal flattening suggesting Elevated PA pressures . Left ventricular  ejection fraction, by estimation, is 45 to 50%. The left ventricle has  mildly decreased function. The left ventricle demonstrates global  hypokinesis. The left ventricular internal  cavity size was mildly dilated. There is severe left ventricular  hypertrophy. Left ventricular diastolic parameters are indeterminate.   2. Right ventricular systolic function is moderately reduced. The right  ventricular size is moderately enlarged.   3. Left atrial size was moderately dilated.   4. The mitral valve is normal in structure. Mild mitral valve  regurgitation. No evidence of mitral stenosis.   5. The aortic valve is normal in structure. Aortic valve regurgitation is  mild. No aortic stenosis is present.   6. Pulmonic valve regurgitation is moderate.   7. The inferior vena cava Not seen well.   FINDINGS   Left Ventricle: Septal flattening suggesting Elevated PA pressures. Left  ventricular ejection fraction, by estimation, is 45 to 50%. The left  ventricle has mildly decreased function. The left ventricle demonstrates  global hypokinesis. Definity contrast  agent was given IV to delineate  the left ventricular endocardial borders.  The left ventricular internal cavity size was mildly dilated. There is  severe left ventricular hypertrophy. Left ventricular diastolic parameters  are indeterminate.   Right Ventricle: The right ventricular size is moderately enlarged. Right  vetricular wall thickness was not assessed. Right ventricular systolic  function is moderately reduced.   Left Atrium: Left atrial size was moderately dilated.   Right Atrium: Right atrial size was normal in size.   Pericardium: There is no evidence of pericardial effusion.   Mitral Valve: The mitral valve is normal in structure. There is mild  thickening of the mitral valve leaflet(s). There is mild calcification of  the mitral valve leaflet(s). Normal mobility of the mitral valve leaflets.  Mild mitral valve regurgitation. No  evidence of mitral valve stenosis.   Tricuspid Valve: The tricuspid valve is normal in structure. Tricuspid  valve regurgitation is trivial. No evidence of tricuspid stenosis.   Aortic Valve: The aortic valve is normal in structure. Aortic valve  regurgitation is mild. No aortic stenosis is present.   Pulmonic Valve: The pulmonic valve was normal in structure. Pulmonic valve  regurgitation is moderate. No evidence of pulmonic stenosis.   Aorta: The aortic root is normal in size and structure.   Venous: The inferior vena cava was not well visualized. The inferior vena  cava Not seen well.   IAS/Shunts: The interatrial septum was not well visualized.      LEFT VENTRICLE  PLAX 2D  LVIDd:         5.20 cm  LVIDs:  4.00 cm  LV PW:         1.30 cm  LV IVS:        1.40 cm  LVOT diam:     2.20 cm  LV SV:         68  LV SV Index:   31  LVOT Area:     3.80 cm      RIGHT VENTRICLE  RV S prime:     7.83 cm/s   LEFT ATRIUM              Index  LA diam:        4.90 cm  2.22 cm/m  LA Vol (A2C):   109.0 ml 49.36 ml/m  LA Vol (A4C):   85.2 ml  38.58 ml/m  LA  Biplane Vol: 98.6 ml  44.65 ml/m   AORTIC VALVE  LVOT Vmax:   84.20 cm/s  LVOT Vmean:  56.300 cm/s  LVOT VTI:    0.180 m     AORTA  Ao Root diam: 2.70 cm      SHUNTS  Systemic VTI:  0.18 m  Systemic Diam: 2.20 cm    Patient Profile     39 y.o. male with PMHx of (in review of Care everywhere)  Aortic coarctation, s/p surgical repair 63 (age 29), with residual ascending/descending thoracic aortic dilation on imaging 10/2017, and moderate AI, Ventricular septal defect (small by notes) and subaortic membrane, also repaired in 1987, HFpEF, with hypertrophic cardiomyopathy, denies history of hypertension, Diabetes versus pre-diabetes, and diastolic CHF, ?p.HTN  Echo>> LV dysfunction-moderate  RV pressures elevated  RHC  PApressuer 165  Resistance 17 Woods units  RA pressure> 15  cMRI  RVE and RV dysfunction  LVE-severe,( 65/53mm)  EF 38%  LGE neg x at site of VSD repair Assessment & Plan    Complete heart block   Cardiomyopathy  EF 40%   AI    Hypertrophic Cardiomyopathy   Pulm HTN--severe   Cholecystitis  dont think R heart failure given normal transaminasitis    Coarctation Repair // Subaortic membrane repair Age 47             Residual dilitation of ascending descending aorta   CHF chronic systolic/diastolic  Hypokalemia   Diuresing  Continue monitoring of complete heart block   Lengthy discussion with patient and his father regarding the serious prognostic concerns with severe pulm HTN    Will see again Monday -- grateful for DB expertise and his management       Virl Axe

## 2020-04-21 LAB — COMPREHENSIVE METABOLIC PANEL
ALT: 61 U/L — ABNORMAL HIGH (ref 0–44)
AST: 45 U/L — ABNORMAL HIGH (ref 15–41)
Albumin: 3.3 g/dL — ABNORMAL LOW (ref 3.5–5.0)
Alkaline Phosphatase: 46 U/L (ref 38–126)
Anion gap: 9 (ref 5–15)
BUN: 16 mg/dL (ref 6–20)
CO2: 30 mmol/L (ref 22–32)
Calcium: 8.7 mg/dL — ABNORMAL LOW (ref 8.9–10.3)
Chloride: 98 mmol/L (ref 98–111)
Creatinine, Ser: 1.18 mg/dL (ref 0.61–1.24)
GFR calc Af Amer: >60 mL/min (ref >60)
GFR calc non Af Amer: >60 mL/min (ref >60)
Glucose, Bld: 180 mg/dL — ABNORMAL HIGH (ref 70–99)
Potassium: 3.4 mmol/L — ABNORMAL LOW (ref 3.5–5.1)
Sodium: 137 mmol/L (ref 135–145)
Total Bilirubin: 1.7 mg/dL — ABNORMAL HIGH (ref 0.3–1.2)
Total Protein: 6.1 g/dL — ABNORMAL LOW (ref 6.5–8.1)

## 2020-04-21 LAB — COOXEMETRY PANEL
Carboxyhemoglobin: 1.9 % — ABNORMAL HIGH (ref 0.5–1.5)
Methemoglobin: 1 % (ref 0.0–1.5)
O2 Saturation: 67.1 %
Total hemoglobin: 14.5 g/dL (ref 12.0–16.0)

## 2020-04-21 LAB — MAGNESIUM: Magnesium: 1.9 mg/dL (ref 1.7–2.4)

## 2020-04-21 LAB — CYCLIC CITRUL PEPTIDE ANTIBODY, IGG/IGA: CCP Antibodies IgG/IgA: 5 units (ref 0–19)

## 2020-04-21 MED ORDER — METOLAZONE 2.5 MG PO TABS
2.5000 mg | ORAL_TABLET | Freq: Once | ORAL | Status: AC
  Administered 2020-04-21: 2.5 mg via ORAL
  Filled 2020-04-21: qty 1

## 2020-04-21 MED ORDER — MAGNESIUM SULFATE 2 GM/50ML IV SOLN
2.0000 g | Freq: Once | INTRAVENOUS | Status: AC
  Administered 2020-04-21: 2 g via INTRAVENOUS
  Filled 2020-04-21: qty 50

## 2020-04-21 MED ORDER — SPIRONOLACTONE 25 MG PO TABS
25.0000 mg | ORAL_TABLET | Freq: Every day | ORAL | Status: DC
  Administered 2020-04-21 – 2020-04-26 (×6): 25 mg via ORAL
  Filled 2020-04-21 (×6): qty 1

## 2020-04-21 MED ORDER — POTASSIUM CHLORIDE CRYS ER 20 MEQ PO TBCR
40.0000 meq | EXTENDED_RELEASE_TABLET | Freq: Once | ORAL | Status: AC
  Administered 2020-04-21: 40 meq via ORAL
  Filled 2020-04-21: qty 2

## 2020-04-21 MED ORDER — SILDENAFIL CITRATE 20 MG PO TABS
20.0000 mg | ORAL_TABLET | Freq: Three times a day (TID) | ORAL | Status: DC
  Administered 2020-04-21 – 2020-04-22 (×4): 20 mg via ORAL
  Filled 2020-04-21 (×4): qty 1

## 2020-04-21 MED ORDER — MAGNESIUM SULFATE 2 GM/50ML IV SOLN: 2.0000 g | Freq: Once | INTRAVENOUS | Status: DC

## 2020-04-21 NOTE — Progress Notes (Signed)
CRITICAL VALUE ALERT  Critical Value:  K 3.4  Date & Time Notied:  04/21/20 0630  Provider Notified: PA Judy Pimple  Orders Received/Actions taken: One time dose of 40 mEq KCl PO 0730. 40 mEq already ordered for 1000.

## 2020-04-21 NOTE — Progress Notes (Signed)
Advanced Heart Failure Rounding Note  PCP-Cardiologist: No primary care provider on file.    Patient Profile   Gabriel Floyd is a 39 y.o. 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   Admitted to [facility] 5/21 for acute cholecystitis and found to be in CHB=>>transfered to [facility] for potential PPM.   Echo EF 45-50% with severe LVH. RV dilated and moderate HK with severe septal flattening c/w RV pressure volume overload LV PW 1.30 cm LV IVS1.40 cm   Subquent RHC showed very severe mixed PH with severely reduce CO (CI 1.6) and no evidence of shunting  RHC 04/18/20  1. Very severe mixed pulmonary HTN 2. Severely reduced CO in setting of cor pulmonale  3. No evidence of intracardiac shunting  5/20 Started on milrinone.    Subjective:    Remains on milrinone and IV lasix. Feels better. Breathing better. No further belly pain. Remains in CHB. BP stable.    Objective:   Weight Range: 103.6 kg Body mass index is 33.73 kg/m.   Vital Signs:   Temp:  [97.5 F (36.4 C)-98.9 F (37.2 C)] 97.5 F (36.4 C) (05/22 0742) Pulse Rate:  [30-51] 49 (05/22 0900) Resp:  [14-25] 22 (05/22 0900) BP: (78-122)/(52-95) 104/70 (05/22 0900) SpO2:  [86 %-100 %] 93 % (05/22 0900) Weight:  [103.6 kg-105.6 kg] 103.6 kg (05/22 0500) Last BM Date: 04/21/20  Weight change: Filed Weights   04/19/20 0500 04/20/20 1040 04/21/20 0500  Weight: 106.1 kg 105.6 kg 103.6 kg     Intake/Output:   Intake/Output Summary (Last 24 hours) at 04/21/2020 0957 Last data filed at 04/21/2020 0538 Gross per 24 hour  Intake 1199 ml  Output 4120 ml  Net -2921 ml      Physical Exam   CVP 9  General:  Sitting on the side of the bed.  No resp difficulty HEENT: normal Neck: supple. JVP 9 + prominent v waves Carotids 2+ bilat; no bruits. No lymphadenopathy or thryomegaly appreciated. Cor: PMI nondisplaced. Gabriel Floyd regular. 2/6 TR Lungs: clear Abdomen: obese soft, nontender, nondistended. No hepatosplenomegaly. No bruits or masses. Good bowel sounds. Extremities: no cyanosis, clubbing, rash, 1+ edema Neuro: alert & orientedx3, cranial nerves grossly intact. moves all 4 extremities w/o difficulty. Affect pleasant    Telemetry  CHB 50s   Labs    CBC Recent Labs    04/18/20 1706 04/19/20 0701  WBC  --  8.7  HGB 13.6 14.2  HCT 40.0 42.9  MCV  --  87.2  PLT  --  177   Basic Metabolic Panel Recent Labs    19/41/74 0650 04/21/20 0440  NA 139 137  K 3.0* 3.4*  CL 103 98  CO2 27 30  GLUCOSE 145* 180*  BUN 19 16  CREATININE 1.14 1.18  CALCIUM 7.9* 8.7*  MG 1.3* 1.9   Liver Function Tests Recent Labs    04/20/20 0650 04/21/20 0440  AST 53* 45*  ALT 61* 61*  ALKPHOS 44 46  BILITOT 1.7* 1.7*  PROT  5.4* 6.1*  ALBUMIN 2.9* 3.3*   No results for input(s): LIPASE, AMYLASE in the last 72 hours. Cardiac Enzymes No results for input(s): CKTOTAL, CKMB, CKMBINDEX, TROPONINI in the last 72 hours.  BNP: BNP (last 3 results) Recent Labs    04/18/20 0238  BNP 1,040.2*    ProBNP (last 3 results) No results for input(s): PROBNP in the last 8760 hours.   D-Dimer No results for input(s): DDIMER in the last 72 hours. Hemoglobin A1C No results for input(s): HGBA1C in the last 72 hours. Fasting Lipid Panel No results for input(s): CHOL, HDL, LDLCALC, TRIG, CHOLHDL, LDLDIRECT in the last 72 hours. Thyroid Function Tests No results for input(s): TSH,  T4TOTAL, T3FREE, THYROIDAB in the last 72 hours.  Invalid input(s): FREET3  Other results:   Imaging    No results found.   Medications:     Scheduled Medications: . Chlorhexidine Gluconate Cloth  6 each Topical Daily  . enoxaparin (LOVENOX) injection  40 mg Subcutaneous Q24H  . furosemide  80 mg Intravenous BID  . potassium chloride  40 mEq Oral Daily  . sodium chloride flush  10-40 mL Intracatheter Q12H  . sodium chloride flush  3 mL Intravenous Q12H    Infusions: . sodium chloride    . dextrose 5 % and 0.9% NaCl Stopped (04/18/20 1446)  . magnesium sulfate bolus IVPB    . milrinone 0.125 mcg/kg/min (04/21/20 0500)  . piperacillin-tazobactam (ZOSYN)  IV 3.375 g (04/21/20 0538)    PRN Medications: sodium chloride, acetaminophen, nitroGLYCERIN, ondansetron (ZOFRAN) IV, sodium chloride flush, sodium chloride flush, sodium chloride flush    Assessment/Plan   1. Biventricular HF with severe pulmonary HTN - unifying diagnosis remains unclear. There is no evidence of residual shunt or infiltrative process on cMRI/MRA or RHC - aside from GI symptoms he has had surprisingly few symptoms despite severe PH and very low output - Volume status improving.  Continue IV Lasix 80 mg bid today. supp K. Give one dose metoalzone - Continue  milrinone 0.125 mg/kg/min.  CVP 9. Co-ox 67% - Add sildenafil 20 tid  - Will need to assess for OSA and need for home O2 support - serologies negative  - Will need VQ and sleep study as well as need for home O2. No clubbing to suggest chronic hypoxia - I d/w [facility] and congenital teams  - Repeat RHC next week. If still markedly decompensated may need to consider IV prostanoids  2. CHB - will need CRT prior to d/c - currently hemodynamically stable - EP following.   3. GI symptoms - initially felt to be acute cholecystitis but suspect related to HF/ hepatic congestion - AST/ALT continue to trend dow.   - can stop Zosyn   4. Renal  Insuffiencey - SCr 1.44 peaked, today down to 1.14  - suspect cardiorenal   5. Hypokalemia  K 3.4 Supp K.   Can go to SDU.   Length of Stay: [place], MD  04/21/2020, 9:57 AM  Advanced Heart Failure Team Pager [phone] (M-F; 7a - 4p)  Please contact [facility] for night-coverage after hours (4p -7a ) and weekends on [email or web address]    .

## 2020-04-22 LAB — COMPREHENSIVE METABOLIC PANEL
ALT: 52 U/L — ABNORMAL HIGH (ref 0–44)
AST: 34 U/L (ref 15–41)
Albumin: 3.8 g/dL (ref 3.5–5.0)
Alkaline Phosphatase: 53 U/L (ref 38–126)
Anion gap: 9 (ref 5–15)
BUN: 21 mg/dL — ABNORMAL HIGH (ref 6–20)
CO2: 31 mmol/L (ref 22–32)
Calcium: 9.2 mg/dL (ref 8.9–10.3)
Chloride: 92 mmol/L — ABNORMAL LOW (ref 98–111)
Creatinine, Ser: 1.16 mg/dL (ref 0.61–1.24)
GFR calc Af Amer: >60 mL/min (ref >60)
GFR calc non Af Amer: >60 mL/min (ref >60)
Glucose, Bld: 180 mg/dL — ABNORMAL HIGH (ref 70–99)
Potassium: 3.3 mmol/L — ABNORMAL LOW (ref 3.5–5.1)
Sodium: 132 mmol/L — ABNORMAL LOW (ref 135–145)
Total Bilirubin: 1.8 mg/dL — ABNORMAL HIGH (ref 0.3–1.2)
Total Protein: 7.2 g/dL (ref 6.5–8.1)

## 2020-04-22 LAB — MPO/PR-3 (ANCA) ANTIBODIES
ANCA Proteinase 3: <3.5 U/mL (ref 0.0–3.5)
Myeloperoxidase Abs: <9 U/mL (ref 0.0–9.0)

## 2020-04-22 LAB — COOXEMETRY PANEL
Carboxyhemoglobin: 1.4 % (ref 0.5–1.5)
Methemoglobin: 0.4 % (ref 0.0–1.5)
O2 Saturation: 74.2 %
Total hemoglobin: 15 g/dL (ref 12.0–16.0)

## 2020-04-22 MED ORDER — ASPIRIN 81 MG PO CHEW
81.0000 mg | CHEWABLE_TABLET | ORAL | Status: AC
  Administered 2020-04-23: 81 mg via ORAL
  Filled 2020-04-22: qty 1

## 2020-04-22 MED ORDER — SODIUM CHLORIDE 0.9 % IV SOLN: 250.0000 mL | INTRAVENOUS | Status: DC | PRN

## 2020-04-22 MED ORDER — SODIUM CHLORIDE 0.9% FLUSH: 3.0000 mL | INTRAVENOUS | Status: DC | PRN

## 2020-04-22 MED ORDER — SODIUM CHLORIDE 0.9 % IV SOLN: INTRAVENOUS | Status: DC

## 2020-04-22 MED ORDER — POTASSIUM CHLORIDE CRYS ER 20 MEQ PO TBCR
40.0000 meq | EXTENDED_RELEASE_TABLET | Freq: Once | ORAL | Status: AC
  Administered 2020-04-22: 40 meq via ORAL
  Filled 2020-04-22: qty 2

## 2020-04-22 MED ORDER — SODIUM CHLORIDE 0.9% FLUSH
3.0000 mL | Freq: Two times a day (BID) | INTRAVENOUS | Status: DC
  Administered 2020-04-22: 3 mL via INTRAVENOUS

## 2020-04-22 MED ORDER — SILDENAFIL CITRATE 20 MG PO TABS
40.0000 mg | ORAL_TABLET | Freq: Three times a day (TID) | ORAL | Status: DC
  Administered 2020-04-22 – 2020-04-23 (×3): 40 mg via ORAL
  Filled 2020-04-22 (×3): qty 2

## 2020-04-22 NOTE — Progress Notes (Signed)
Advanced Heart Failure Rounding Note  PCP-Cardiologist: No primary care provider on file.    Patient Profile   Gabriel Floyd is a 39 y.o. 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   Admitted to [facility] 5/21 for acute cholecystitis and found to be in CHB=>>transfered to [facility] for potential PPM.   Echo EF 45-50% with severe LVH. RV dilated and moderate HK with severe septal flattening c/w RV pressure volume overload LV PW 1.30 cm LV IVS1.40 cm   Subquent RHC showed very severe mixed PH with severely reduce CO (CI 1.6) and no evidence of shunting  RHC 04/18/20  1. Very severe mixed pulmonary HTN 2. Severely reduced CO in setting of cor pulmonale  3. No evidence of intracardiac shunting  5/20 Started on milrinone.    Subjective:    Remains on milrinone and IV lasix. Weight down another 2 pounds.  Sildenafil started yesterday. Breathing much better. Walking halls. Denies SOB. Ab pain resolved. Sill in CHB.    Objective:   Weight Range: 102.8 kg Body mass index is 33.47 kg/m.   Vital Signs:   Temp:  [98.4 F (36.9 C)-99.4 F (37.4 C)] 98.5 F (36.9 C) (05/23 0739) Pulse Rate:  [43-63] 49 (05/23 0900) Resp:  [14-36] 26 (05/23 0900) BP: (89-123)/(54-86) 111/69 (05/23 0833) SpO2:  [86 %-97 %] 90 % (05/23 0900) Weight:  [102.8 kg] 102.8 kg (05/23 0500) Last BM Date: 04/22/20  Weight change: Filed Weights   04/20/20 1040 04/21/20 0500 04/22/20 0500    Weight: 105.6 kg 103.6 kg 102.8 kg    Intake/Output:   Intake/Output Summary (Last 24 hours) at 04/22/2020 1132 Last data filed at 04/22/2020 0900 Gross per 24 hour  Intake 976.33 ml  Output 2650 ml  Net -1673.67 ml      Physical Exam   General:  Well appearing. No resp difficulty HEENT: normal Neck: supple. no JVD. Carotids 2+ bilat; no bruits. No lymphadenopathy or thryomegaly appreciated. Cor: PMI nondisplaced. Regular brady  2/6 TR Lungs: clear Abdomen: soft, nontender, nondistended. No hepatosplenomegaly. No bruits or masses. Good bowel sounds. Extremities: no cyanosis, clubbing, rash, edema Neuro: alert & orientedx3, cranial nerves grossly intact. moves all 4 extremities w/o difficulty. Affect pleasant   Telemetry   CHB 50 Personally reviewed  Labs    CBC No results for input(s): WBC, NEUTROABS, HGB, HCT, MCV, PLT in the last 72 hours. Basic Metabolic Panel Recent Labs    73/41/93 0650 04/20/20 0650 04/21/20 0440 04/22/20 0450  NA 139   < > 137 132*  K 3.0*   < > 3.4* 3.3*  CL 103   < > 98 92*  CO2 27   < > 30 31  GLUCOSE 145*   < > 180* 180*  BUN 19   < > 16 21*  CREATININE 1.14   < > 1.18 1.16  CALCIUM 7.9*   < > 8.7* 9.2  MG 1.3*  --  1.9  --    < > = values in this  interval not displayed.   Liver Function Tests Recent Labs    04/21/20 0440 04/22/20 0450  AST 45* 34  ALT 61* 52*  ALKPHOS 46 53  BILITOT 1.7* 1.8*  PROT 6.1* 7.2  ALBUMIN 3.3* 3.8   No results for input(s): LIPASE, AMYLASE in the last 72 hours. Cardiac Enzymes No results for input(s): CKTOTAL, CKMB, CKMBINDEX, TROPONINI in the last 72 hours.  BNP: BNP (last 3 results) Recent Labs    04/18/20 0238  BNP 1,040.2*    ProBNP (last 3 results) No results for input(s): PROBNP in the last 8760 hours.   D-Dimer No results for input(s): DDIMER in the last 72 hours. Hemoglobin A1C No results for input(s): HGBA1C in the last 72 hours. Fasting Lipid Panel No results for  input(s): CHOL, HDL, LDLCALC, TRIG, CHOLHDL, LDLDIRECT in the last 72 hours. Thyroid Function Tests No results for input(s): TSH, T4TOTAL, T3FREE, THYROIDAB in the last 72 hours.  Invalid input(s): FREET3  Other results:   Imaging    No results found.   Medications:     Scheduled Medications: . Chlorhexidine Gluconate Cloth  6 each Topical Daily  . enoxaparin (LOVENOX) injection  40 mg Subcutaneous Q24H  . furosemide  80 mg Intravenous BID  . potassium chloride  40 mEq Oral Daily  . potassium chloride  40 mEq Oral Once  . sildenafil  20 mg Oral TID  . sodium chloride flush  10-40 mL Intracatheter Q12H  . sodium chloride flush  3 mL Intravenous Q12H  . spironolactone  25 mg Oral Daily    Infusions: . sodium chloride    . dextrose 5 % and 0.9% NaCl Stopped (04/18/20 1446)  . milrinone 0.125 mcg/kg/min (04/22/20 0900)    PRN Medications: sodium chloride, acetaminophen, ondansetron (ZOFRAN) IV, sodium chloride flush, sodium chloride flush, sodium chloride flush    Assessment/Plan   1. Biventricular HF with severe pulmonary HTN - unifying diagnosis remains unclear. There is no evidence of residual shunt or infiltrative process on cMRI/MRA or RHC - aside from GI symptoms he has had surprisingly few symptoms despite severe PH and very low output - Volume status improving.  Continue IV Lasix 80 mg bid today. supp K. Give one dose metoalzone - Continue  milrinone 0.125 mg/kg/min.  CVP 9. Co-ox 74% - Increase sildenafil to 40 tid  - Will need to assess for OSA and need for home O2 support - serologies negative  - Will need VQ and sleep study as well as need for home O2. No clubbing to suggest chronic hypoxia - I d/w [facility] and congenital teams  - Repeat RHC tomorrow. If still markedly decompensated may need to consider IV prostanoids  2. CHB - will need CRT prior to d/c - currently hemodynamically stable - EP following. Wil f/u in am  3. GI symptoms - initially  felt to be acute cholecystitis but suspect related to HF/ hepatic congestion - AST/ALT continue to trend down with treatment of PAH - Off zosyn   4. Renal Insuffiencey - SCr 1.44 peaked, today down to 1.16  - suspect cardiorenal   5. Hypokalemia  K 3.3 Supp K.   Can go to SDU.   Length of Stay: [place], MD  04/22/2020, 11:32 AM  Advanced Heart Failure Team Pager [phone] (M-F; 7a - 4p)  Please contact [facility] for night-coverage after hours (4p -7a ) and weekends on [email or web address]    .

## 2020-04-22 NOTE — H&P (View-Only) (Signed)
Advanced Heart Failure Rounding Note  PCP-Cardiologist: No primary care provider on file.    Patient Profile   Gabriel Floyd is a 39 y.o. 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   Admitted to [facility] 5/21 for acute cholecystitis and found to be in CHB=>>transfered to [facility] for potential PPM.   Echo EF 45-50% with severe LVH. RV dilated and moderate HK with severe septal flattening c/w RV pressure volume overload LV PW 1.30 cm LV IVS1.40 cm   Subquent RHC showed very severe mixed PH with severely reduce CO (CI 1.6) and no evidence of shunting  RHC 04/18/20  1. Very severe mixed pulmonary HTN 2. Severely reduced CO in setting of cor pulmonale  3. No evidence of intracardiac shunting  5/20 Started on milrinone.    Subjective:    Remains on milrinone and IV lasix. Weight down another 2 pounds.  Sildenafil started yesterday. Breathing much better. Walking halls. Denies SOB. Ab pain resolved. Sill in CHB.    Objective:   Weight Range: 102.8 kg Body mass index is 33.47 kg/m.   Vital Signs:   Temp:  [98.4 F (36.9 C)-99.4 F (37.4 C)] 98.5 F (36.9 C) (05/23 0739) Pulse Rate:  [43-63] 49 (05/23 0900) Resp:  [14-36] 26 (05/23 0900) BP: (89-123)/(54-86) 111/69 (05/23 0833) SpO2:  [86 %-97 %] 90 % (05/23 0900) Weight:  [102.8 kg] 102.8 kg (05/23 0500) Last BM Date: 04/22/20  Weight change: Filed Weights   04/20/20 1040 04/21/20 0500 04/22/20 0500    Weight: 105.6 kg 103.6 kg 102.8 kg    Intake/Output:   Intake/Output Summary (Last 24 hours) at 04/22/2020 1132 Last data filed at 04/22/2020 0900 Gross per 24 hour  Intake 976.33 ml  Output 2650 ml  Net -1673.67 ml      Physical Exam   General:  Well appearing. No resp difficulty HEENT: normal Neck: supple. no JVD. Carotids 2+ bilat; no bruits. No lymphadenopathy or thryomegaly appreciated. Cor: PMI nondisplaced. Regular brady  2/6 TR Lungs: clear Abdomen: soft, nontender, nondistended. No hepatosplenomegaly. No bruits or masses. Good bowel sounds. Extremities: no cyanosis, clubbing, rash, edema Neuro: alert & orientedx3, cranial nerves grossly intact. moves all 4 extremities w/o difficulty. Affect pleasant   Telemetry   CHB 50 Personally reviewed  Labs    CBC No results for input(s): WBC, NEUTROABS, HGB, HCT, MCV, PLT in the last 72 hours. Basic Metabolic Panel Recent Labs    73/41/93 0650 04/20/20 0650 04/21/20 0440 04/22/20 0450  NA 139   < > 137 132*  K 3.0*   < > 3.4* 3.3*  CL 103   < > 98 92*  CO2 27   < > 30 31  GLUCOSE 145*   < > 180* 180*  BUN 19   < > 16 21*  CREATININE 1.14   < > 1.18 1.16  CALCIUM 7.9*   < > 8.7* 9.2  MG 1.3*  --  1.9  --    < > = values in this  interval not displayed.   Liver Function Tests Recent Labs    04/21/20 0440 04/22/20 0450  AST 45* 34  ALT 61* 52*  ALKPHOS 46 53  BILITOT 1.7* 1.8*  PROT 6.1* 7.2  ALBUMIN 3.3* 3.8   No results for input(s): LIPASE, AMYLASE in the last 72 hours. Cardiac Enzymes No results for input(s): CKTOTAL, CKMB, CKMBINDEX, TROPONINI in the last 72 hours.  BNP: BNP (last 3 results) Recent Labs    04/18/20 0238  BNP 1,040.2*    ProBNP (last 3 results) No results for input(s): PROBNP in the last 8760 hours.   D-Dimer No results for input(s): DDIMER in the last 72 hours. Hemoglobin A1C No results for input(s): HGBA1C in the last 72 hours. Fasting Lipid Panel No results for  input(s): CHOL, HDL, LDLCALC, TRIG, CHOLHDL, LDLDIRECT in the last 72 hours. Thyroid Function Tests No results for input(s): TSH, T4TOTAL, T3FREE, THYROIDAB in the last 72 hours.  Invalid input(s): FREET3  Other results:   Imaging    No results found.   Medications:     Scheduled Medications: . Chlorhexidine Gluconate Cloth  6 each Topical Daily  . enoxaparin (LOVENOX) injection  40 mg Subcutaneous Q24H  . furosemide  80 mg Intravenous BID  . potassium chloride  40 mEq Oral Daily  . potassium chloride  40 mEq Oral Once  . sildenafil  20 mg Oral TID  . sodium chloride flush  10-40 mL Intracatheter Q12H  . sodium chloride flush  3 mL Intravenous Q12H  . spironolactone  25 mg Oral Daily    Infusions: . sodium chloride    . dextrose 5 % and 0.9% NaCl Stopped (04/18/20 1446)  . milrinone 0.125 mcg/kg/min (04/22/20 0900)    PRN Medications: sodium chloride, acetaminophen, ondansetron (ZOFRAN) IV, sodium chloride flush, sodium chloride flush, sodium chloride flush    Assessment/Plan   1. Biventricular HF with severe pulmonary HTN - unifying diagnosis remains unclear. There is no evidence of residual shunt or infiltrative process on cMRI/MRA or RHC - aside from GI symptoms he has had surprisingly few symptoms despite severe PH and very low output - Volume status improving.  Continue IV Lasix 80 mg bid today. supp K. Give one dose metoalzone - Continue  milrinone 0.125 mg/kg/min.  CVP 9. Co-ox 74% - Increase sildenafil to 40 tid  - Will need to assess for OSA and need for home O2 support - serologies negative  - Will need VQ and sleep study as well as need for home O2. No clubbing to suggest chronic hypoxia - I d/w [facility] and congenital teams  - Repeat RHC tomorrow. If still markedly decompensated may need to consider IV prostanoids  2. CHB - will need CRT prior to d/c - currently hemodynamically stable - EP following. Wil f/u in am  3. GI symptoms - initially  felt to be acute cholecystitis but suspect related to HF/ hepatic congestion - AST/ALT continue to trend down with treatment of PAH - Off zosyn   4. Renal Insuffiencey - SCr 1.44 peaked, today down to 1.16  - suspect cardiorenal   5. Hypokalemia  K 3.3 Supp K.   Can go to SDU.   Length of Stay: 5  Blake Goya, MD  04/22/2020, 11:32 AM  Advanced Heart Failure Team Pager [phone] (M-F; 7a - 4p)  Please contact [facility] for night-coverage after hours (4p -7a ) and weekends on [email or web address]    . 

## 2020-04-23 ENCOUNTER — Inpatient Hospital Stay (HOSPITAL_COMMUNITY): Payer: Self-pay

## 2020-04-23 ENCOUNTER — Encounter (HOSPITAL_COMMUNITY)
Admission: AD | Disposition: A | Discharge status: Home / Self Care | Payer: Self-pay | Source: Other Acute Inpatient Hospital | Attending: Internal Medicine

## 2020-04-23 HISTORY — PX: RIGHT HEART CATH: CATH118263

## 2020-04-23 LAB — COMPREHENSIVE METABOLIC PANEL
ALT: 40 U/L (ref 0–44)
AST: 29 U/L (ref 15–41)
Albumin: 3.8 g/dL (ref 3.5–5.0)
Alkaline Phosphatase: 66 U/L (ref 38–126)
Anion gap: 13 (ref 5–15)
BUN: 29 mg/dL — ABNORMAL HIGH (ref 6–20)
CO2: 30 mmol/L (ref 22–32)
Calcium: 9.3 mg/dL (ref 8.9–10.3)
Chloride: 91 mmol/L — ABNORMAL LOW (ref 98–111)
Creatinine, Ser: 1.34 mg/dL — ABNORMAL HIGH (ref 0.61–1.24)
GFR calc Af Amer: >60 mL/min (ref >60)
GFR calc non Af Amer: >60 mL/min (ref >60)
Glucose, Bld: 201 mg/dL — ABNORMAL HIGH (ref 70–99)
Potassium: 3.1 mmol/L — ABNORMAL LOW (ref 3.5–5.1)
Sodium: 134 mmol/L — ABNORMAL LOW (ref 135–145)
Total Bilirubin: 1.2 mg/dL (ref 0.3–1.2)
Total Protein: 7.6 g/dL (ref 6.5–8.1)

## 2020-04-23 LAB — POCT I-STAT EG7
Acid-Base Excess: 8 mmol/L — ABNORMAL HIGH (ref 0.0–2.0)
Acid-Base Excess: 8 mmol/L — ABNORMAL HIGH (ref 0.0–2.0)
Bicarbonate: 33.2 mmol/L — ABNORMAL HIGH (ref 20.0–28.0)
Bicarbonate: 33.9 mmol/L — ABNORMAL HIGH (ref 20.0–28.0)
Calcium, Ion: 1.11 mmol/L — ABNORMAL LOW (ref 1.15–1.40)
Calcium, Ion: 1.19 mmol/L (ref 1.15–1.40)
HCT: 49 % (ref 39.0–52.0)
HCT: 50 % (ref 39.0–52.0)
Hemoglobin: 16.7 g/dL (ref 13.0–17.0)
Hemoglobin: 17 g/dL (ref 13.0–17.0)
O2 Saturation: 68 %
O2 Saturation: 70 %
Potassium: 3.1 mmol/L — ABNORMAL LOW (ref 3.5–5.1)
Potassium: 3.3 mmol/L — ABNORMAL LOW (ref 3.5–5.1)
Sodium: 138 mmol/L (ref 135–145)
Sodium: 139 mmol/L (ref 135–145)
TCO2: 35 mmol/L — ABNORMAL HIGH (ref 22–32)
TCO2: 35 mmol/L — ABNORMAL HIGH (ref 22–32)
pCO2, Ven: 47.2 mmHg (ref 44.0–60.0)
pCO2, Ven: 48.3 mmHg (ref 44.0–60.0)
pH, Ven: 7.454 — ABNORMAL HIGH (ref 7.250–7.430)
pH, Ven: 7.455 — ABNORMAL HIGH (ref 7.250–7.430)
pO2, Ven: 34 mmHg (ref 32.0–45.0)
pO2, Ven: 35 mmHg (ref 32.0–45.0)

## 2020-04-23 LAB — COOXEMETRY PANEL
Carboxyhemoglobin: 1.7 % — ABNORMAL HIGH (ref 0.5–1.5)
Methemoglobin: 0.9 % (ref 0.0–1.5)
O2 Saturation: 66.2 %
Total hemoglobin: 16.6 g/dL — ABNORMAL HIGH (ref 12.0–16.0)

## 2020-04-23 SURGERY — RIGHT HEART CATH
Anesthesia: LOCAL

## 2020-04-23 MED ORDER — SODIUM CHLORIDE 0.9 % IV SOLN: 250.0000 mL | INTRAVENOUS | Status: DC | PRN

## 2020-04-23 MED ORDER — ACETAMINOPHEN 325 MG PO TABS: 650.0000 mg | ORAL_TABLET | ORAL | Status: DC | PRN

## 2020-04-23 MED ORDER — SILDENAFIL CITRATE 20 MG PO TABS
60.0000 mg | ORAL_TABLET | Freq: Three times a day (TID) | ORAL | Status: DC
  Administered 2020-04-23 – 2020-04-26 (×9): 60 mg via ORAL
  Filled 2020-04-23 (×9): qty 3

## 2020-04-23 MED ORDER — TORSEMIDE 20 MG PO TABS
20.0000 mg | ORAL_TABLET | Freq: Every day | ORAL | Status: DC
  Administered 2020-04-23 – 2020-04-25 (×3): 20 mg via ORAL
  Filled 2020-04-23 (×3): qty 1

## 2020-04-23 MED ORDER — HYDRALAZINE HCL 20 MG/ML IJ SOLN: 10.0000 mg | INTRAMUSCULAR | Status: AC | PRN

## 2020-04-23 MED ORDER — LABETALOL HCL 5 MG/ML IV SOLN: 10.0000 mg | INTRAVENOUS | Status: AC | PRN

## 2020-04-23 MED ORDER — POTASSIUM CHLORIDE CRYS ER 20 MEQ PO TBCR
40.0000 meq | EXTENDED_RELEASE_TABLET | Freq: Once | ORAL | Status: AC
  Administered 2020-04-23: 40 meq via ORAL
  Filled 2020-04-23: qty 2

## 2020-04-23 MED ORDER — HEPARIN (PORCINE) IN NACL 1000-0.9 UT/500ML-% IV SOLN
INTRAVENOUS | Status: AC
  Filled 2020-04-23: qty 500

## 2020-04-23 MED ORDER — POTASSIUM CHLORIDE CRYS ER 20 MEQ PO TBCR
40.0000 meq | EXTENDED_RELEASE_TABLET | Freq: Once | ORAL | Status: DC
  Filled 2020-04-23: qty 2

## 2020-04-23 MED ORDER — SODIUM CHLORIDE 0.9% FLUSH: 3.0000 mL | INTRAVENOUS | Status: DC | PRN

## 2020-04-23 MED ORDER — SODIUM CHLORIDE 0.9% FLUSH
3.0000 mL | Freq: Two times a day (BID) | INTRAVENOUS | Status: DC
  Administered 2020-04-24: 3 mL via INTRAVENOUS

## 2020-04-23 MED ORDER — HEPARIN (PORCINE) IN NACL 1000-0.9 UT/500ML-% IV SOLN
INTRAVENOUS | Status: DC | PRN
  Administered 2020-04-23: 500 mL

## 2020-04-23 MED ORDER — ENOXAPARIN SODIUM 40 MG/0.4ML ~~LOC~~ SOLN: 40.0000 mg | SUBCUTANEOUS | Status: DC

## 2020-04-23 MED ORDER — TORSEMIDE 20 MG PO TABS
40.0000 mg | ORAL_TABLET | Freq: Every day | ORAL | Status: DC
  Administered 2020-04-24 – 2020-04-26 (×3): 40 mg via ORAL
  Filled 2020-04-23 (×3): qty 2

## 2020-04-23 MED ORDER — LIDOCAINE HCL (PF) 1 % IJ SOLN
INTRAMUSCULAR | Status: AC
  Filled 2020-04-23: qty 30

## 2020-04-23 MED ORDER — LIDOCAINE HCL (PF) 1 % IJ SOLN
INTRAMUSCULAR | Status: DC | PRN
  Administered 2020-04-23: 2 mL

## 2020-04-23 MED ORDER — ONDANSETRON HCL 4 MG/2ML IJ SOLN: 4.0000 mg | Freq: Four times a day (QID) | INTRAMUSCULAR | Status: DC | PRN

## 2020-04-23 SURGICAL SUPPLY — 6 items
CATH BALLN WEDGE 5F 110CM (CATHETERS) ×1 IMPLANT
PACK CARDIAC CATHETERIZATION (CUSTOM PROCEDURE TRAY) ×2 IMPLANT
SHEATH GLIDE SLENDER 4/5FR (SHEATH) ×1 IMPLANT
TRANSDUCER W/STOPCOCK (MISCELLANEOUS) ×2 IMPLANT
TUBING ART PRESS 72  MALE/FEM (TUBING) ×1
TUBING ART PRESS 72 MALE/FEM (TUBING) IMPLANT

## 2020-04-23 NOTE — Progress Notes (Signed)
Advanced Heart Failure Rounding Note  PCP-Cardiologist: No primary care provider on file.    Patient Profile   Gabriel Floyd is a 39 y.o. 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   Admitted to [facility] 5/21 for acute cholecystitis and found to be in CHB=>>transfered to [facility] for potential PPM.   Echo EF 45-50% with severe LVH. RV dilated and moderate HK with severe septal flattening c/w RV pressure volume overload LV PW 1.30 cm LV IVS1.40 cm   RHC 04/18/20 with PAP 132/77 PCWP 31  5/20 Started on milrinone.   Remains on milrinone 0.125 and sildenafil 40 tid. Feels much better. Diuresing well. Deneis SOB, orthopnea or PND.   RHC today on milrinone 0.125 RA 3 RV 69/4 PA 68/23 (41) PCWP 22 Fick 5.1/2.3  Subjective:    Remains on milrinone and IV lasix. Weight down another 2 pounds.  Sildenafil started yesterday. Breathing much better. Walking halls. Denies SOB. Ab pain resolved. Sill in CHB.    Objective:   Weight Range: 102.8 kg Body mass index is 33.47 kg/m.   Vital Signs:   Temp:  [98 F (36.7 C)-99.6 F (37.6 C)] 99.6 F (37.6 C) (05/24 0801) Pulse Rate:  [23-50] 47 (05/24 1100) Resp:  [0-34] 18 (05/24 1100) BP: (92-134)/(58-108) 94/58 (05/24 1100) SpO2:  [0 %-100 %] 100 % (05/24 1100) Last BM Date: 04/22/20  Weight change: Filed Weights   04/20/20 1040 04/21/20 0500 04/22/20 0500  Weight: 105.6 kg 103.6 kg 102.8 kg    Intake/Output:    Intake/Output Summary (Last 24 hours) at 04/23/2020 1106 Last data filed at 04/23/2020 0400 Gross per 24 hour  Intake 67.63 ml  Output 975 ml  Net -907.37 ml      Physical Exam   General:  Well appearing. No resp difficulty HEENT: normal Neck: supple. no JVD. Carotids 2+ bilat; no bruits. No lymphadenopathy or thryomegaly appreciated. Cor: PMI nondisplaced. Regular rate & rhythm. No rubs, gallops or murmurs. Lungs: clear Abdomen: obese soft, nontender, nondistended. No hepatosplenomegaly. No bruits or masses. Good bowel sounds. Extremities: no cyanosis, clubbing, rash, edema Neuro: alert & orientedx3, cranial nerves grossly intact. moves all 4 extremities w/o difficulty. Affect pleasant   Telemetry   CHB 50 Personally reviewed  Labs    CBC No results for input(s): WBC, NEUTROABS, HGB, HCT, MCV, PLT in the last 72 hours. Basic Metabolic Panel Recent Labs    47/42/59 0440 04/21/20 0440 04/22/20 0450 04/23/20 0500  NA 137   < > 132* 134*  K 3.4*   < > 3.3* 3.1*  CL 98   < > 92* 91*  CO2 30   < > 31 30  GLUCOSE 180*   < > 180* 201*  BUN 16   < > 21* 29*  CREATININE 1.18   < > 1.16 1.34*  CALCIUM 8.7*   < > 9.2 9.3  MG 1.9  --   --   --    < > = values in this interval not displayed.  Liver Function Tests Recent Labs    04/22/20 0450 04/23/20 0500  AST 34 29  ALT 52* 40  ALKPHOS 53 66  BILITOT 1.8* 1.2  PROT 7.2 7.6  ALBUMIN 3.8 3.8   No results for input(s): LIPASE, AMYLASE in the last 72 hours. Cardiac Enzymes No results for input(s): CKTOTAL, CKMB, CKMBINDEX, TROPONINI in the last 72 hours.  BNP: BNP (last 3 results) Recent Labs    04/18/20 0238  BNP 1,040.2*    ProBNP (last 3 results) No results for input(s): PROBNP in the last 8760 hours.   D-Dimer No results for input(s): DDIMER in the last 72 hours. Hemoglobin A1C No results for input(s): HGBA1C in the last 72 hours. Fasting Lipid Panel No results for input(s): CHOL, HDL, LDLCALC,  TRIG, CHOLHDL, LDLDIRECT in the last 72 hours. Thyroid Function Tests No results for input(s): TSH, T4TOTAL, T3FREE, THYROIDAB in the last 72 hours.  Invalid input(s): FREET3  Other results:   Imaging    No results found.   Medications:     Scheduled Medications: . [MAR Hold] Chlorhexidine Gluconate Cloth  6 each Topical Daily  . [MAR Hold] enoxaparin (LOVENOX) injection  40 mg Subcutaneous Q24H  . [MAR Hold] furosemide  80 mg Intravenous BID  . [MAR Hold] potassium chloride  40 mEq Oral Daily  . [MAR Hold] sildenafil  40 mg Oral TID  . [MAR Hold] sodium chloride flush  10-40 mL Intracatheter Q12H  . [MAR Hold] sodium chloride flush  3 mL Intravenous Q12H  . sodium chloride flush  3 mL Intravenous Q12H  . [MAR Hold] spironolactone  25 mg Oral Daily    Infusions: . [MAR Hold] sodium chloride    . sodium chloride    . sodium chloride 10 mL/hr at 04/23/20 0652  . dextrose 5 % and 0.9% NaCl Stopped (04/18/20 1446)  . milrinone 0.125 mcg/kg/min (04/23/20 0400)    PRN Medications: [MAR Hold] sodium chloride, sodium chloride, [MAR Hold] acetaminophen, Heparin (Porcine) in NaCl, lidocaine (PF), [MAR Hold] ondansetron (ZOFRAN) IV, [MAR Hold] sodium chloride flush, [MAR Hold] sodium chloride flush, [MAR Hold] sodium chloride flush, sodium chloride flush    Assessment/Plan   1. Biventricular HF with severe pulmonary HTN - unifying diagnosis remains unclear. There is no evidence of residual shunt or infiltrative process on cMRI/MRA or RHC - aside from GI symptoms he has had surprisingly few symptoms despite severe PH and very low output - RHC today with much improved hemodynamics - Stop milrinone.  - Increase sildenafil to 60 tid  - Switch IV lasix to po torsemide 40/20 daily - Continue spiro  - Will need to assess for OSA and need for home O2 support - serologies negative  - VQ today - I d/w [facility] and congenital teams  - Remove PICC. Pacer Wednesday. Will increase  sildeanfil in house and add macitentan as outpatient. Needs assessment for home O2.   2. CHB - will need CRT prior to d/c. D/w Dr. Caryl Comes. Will plan Wednesday - currently hemodynamically stable  3. GI symptoms - initially felt to be acute cholecystitis but suspect related to HF/ hepatic congestion - AST/ALT continue to trend down with treatment of PAH - Off zosyn   4. Renal Insuffiencey - SCr 1.44 peaked, today down to 1.34 - suspect cardiorenal   5. Hypokalemia  K 3.1 Supp K.   Can go to SDU.   Length of Stay: [place], [place]  04/23/2020, 11:06 AM  Advanced Heart Failure Team  Pager [phone] (M-F; 7a - 4p)  Please contact [facility] for night-coverage after hours (4p -7a ) and weekends on [email or web address]    .

## 2020-04-23 NOTE — Progress Notes (Signed)
° °Progress Note ° °Patient Name: Gabriel Floyd °Date of Encounter: 04/23/2020 ° °Primary Cardiologist: Dr. Krasowski ° °Subjective  °  °Breathing better °RHC today with PA pressure 60 on sildenafil  ° °Inpatient Medications  °  °Scheduled Meds: °• Chlorhexidine Gluconate Cloth  6 each Topical Daily  °• [START ON 04/24/2020] enoxaparin (LOVENOX) injection  40 mg Subcutaneous Q24H  °• potassium chloride  40 mEq Oral Daily  °• potassium chloride  40 mEq Oral Once  °• sildenafil  60 mg Oral TID  °• sodium chloride flush  10-40 mL Intracatheter Q12H  °• sodium chloride flush  3 mL Intravenous Q12H  °• sodium chloride flush  3 mL Intravenous Q12H  °• spironolactone  25 mg Oral Daily  °• torsemide  20 mg Oral q1800  °• [START ON 04/24/2020] torsemide  40 mg Oral Daily  ° °Continuous Infusions: °• sodium chloride    °• sodium chloride    °• dextrose 5 % and 0.9% NaCl Stopped (04/18/20 1446)  ° °PRN Meds: °sodium chloride, sodium chloride, acetaminophen, hydrALAZINE, labetalol, ondansetron (ZOFRAN) IV, sodium chloride flush, sodium chloride flush, sodium chloride flush, sodium chloride flush  ° °Vital Signs  °  °Vitals:  ° 04/23/20 1100 04/23/20 1200 04/23/20 1217 04/23/20 1553  °BP: (!) 94/58  120/79 106/68  °Pulse: (!) 47  (!) 51 88  °Resp: 18  16 18  °Temp:   98.2 °F (36.8 °C) 98 °F (36.7 °C)  °TempSrc:   Oral Oral  °SpO2: 100%  94% 92%  °Weight:  100.3 kg    °Height:      ° ° °Intake/Output Summary (Last 24 hours) at 04/23/2020 1704 °Last data filed at 04/23/2020 0400 °Gross per 24 hour  °Intake 43.77 ml  °Output 650 ml  °Net -606.23 ml  ° °Last 3 Weights 04/23/2020 04/22/2020 04/21/2020  °Weight (lbs) 221 lb 3.2 oz 226 lb 10.1 oz 228 lb 6.3 oz  °Weight (kg) 100.336 kg 102.8 kg 103.6 kg  °   ° °Telemetry  °  °Personally reviewed  *CHB  ° °ECG  °  °No new EKGs - Personally Reviewed ° °Physical Exam  °Well developed and nourished in no acute distress °HENT normal °Neck supple   °Clear °Regular rate and rhythm, no murmurs or  gallops °Abd-soft with active BS °No Clubbing cyanosis edema °Skin-warm and dry °A & Oriented  Grossly normal sensory and motor function ° ° *  °  ° ° °Labs  °  °High Sensitivity Troponin:  No results for input(s): TROPONINIHS in the last 720 hours.   ° °Chemistry °Recent Labs  °Lab 04/21/20 °0440 04/21/20 °0440 04/22/20 °0450 04/22/20 °0450 04/23/20 °0500 04/23/20 °1059 04/23/20 °1100  °NA 137   < > 132*   < > 134* 139 138  °K 3.4*   < > 3.3*   < > 3.1* 3.1* 3.3*  °CL 98  --  92*  --  91*  --   --   °CO2 30  --  31  --  30  --   --   °GLUCOSE 180*  --  180*  --  201*  --   --   °BUN 16  --  21*  --  29*  --   --   °CREATININE 1.18  --  1.16  --  1.34*  --   --   °CALCIUM 8.7*  --  9.2  --  9.3  --   --   °PROT 6.1*  --  7.2  --    7.6  --   --   °ALBUMIN 3.3*  --  3.8  --  3.8  --   --   °AST 45*  --  34  --  29  --   --   °ALT 61*  --  52*  --  40  --   --   °ALKPHOS 46  --  53  --  66  --   --   °BILITOT 1.7*  --  1.8*  --  1.2  --   --   °GFRNONAA >60  --  >60  --  >60  --   --   °GFRAA >60  --  >60  --  >60  --   --   °ANIONGAP 9  --  9  --  13  --   --   ° < > = values in this interval not displayed.  °  ° °Hematology °Recent Labs  °Lab 04/18/20 °0238 04/18/20 °1702 04/19/20 °0701 04/23/20 °1059 04/23/20 °1100  °WBC 11.3*  --  8.7  --   --   °RBC 5.14  --  4.92  --   --   °HGB 14.8   < > 14.2 16.7 17.0  °HCT 45.2   < > 42.9 49.0 50.0  °MCV 87.9  --  87.2  --   --   °MCH 28.8  --  28.9  --   --   °MCHC 32.7  --  33.1  --   --   °RDW 13.8  --  13.7  --   --   °PLT 190  --  177  --   --   ° < > = values in this interval not displayed.  ° ° °BNP °Recent Labs  °Lab 04/18/20 °0238  °BNP 1,040.2*  °  ° °DDimer No results for input(s): DDIMER in the last 168 hours.  ° °Radiology  °  ° °MR CARDIAC MORPHOLOGY W WO CONTRAST °Result Date: 04/18/2020 °CLINICAL DATA:  39-year-old male with h/o repaired aortic coarctation, NICM, acute on chronic systolic and diastolic CHF and complete heart block. EXAM: CARDIAC MRI TECHNIQUE:  The patient was scanned on a 1.5 Tesla GE magnet. A dedicated cardiac coil was used. Functional imaging was done using Fiesta sequences. 2,3, and 4 chamber views were done to assess for RWMA's. Modified Simpson's rule using a short axis stack was used to calculate an ejection fraction on a dedicated work station using Circle software. The patient received 10 cc of Gadavist. After 10 minutes inversion recovery sequences were used to assess for infiltration and scar tissue. CONTRAST:  10 cc  of Gadavist FINDINGS: 1. Severely dilated left ventricle with moderate basal septal hypertrophy and moderately decreased systolic function (LVEF = 38%) with diffuse hypokinesis and no regional wall motion abnormalities. There is late gadolinium enhancement in the membranous portion of the interventricular septum - possibly representing prior VSD repair. No LGE is seen in the left ventricular myocardium. LVEDD: 78 mm LVESD: 65 mm LVEDV: 372 ml LVESV: 228 ml SV: 144 ml CO: 6.3 L/min Myocardial mass: 219 g 2. Moderately dilated right ventricle with significant hypertrabeculations and moderately decreased systolic function (RVEF = 31%). There are no regional wall motion abnormalities. 3.  Moderately dilated left and mildly dilated right atrium. 4. Normal size of the aortic root, ascending aorta. Severely dilated pulmonary artery measuring 45 mm consistent with pulmonary hypertension. 5.  Mild aortic, mitral and tricuspid regurgitation. 6.  Normal pericardium.  No pericardial effusion. IMPRESSION:   1. Severely dilated left ventricle with moderate basal septal hypertrophy and moderately decreased systolic function (LVEF = 38%) with diffuse hypokinesis and no regional wall motion abnormalities. There is late gadolinium enhancement in the membranous portion of the interventricular septum - possibly representing prior VSD repair. No LGE is seen in the left ventricular myocardium. 2. Moderately dilated right ventricle with significant  hypertrabeculations and moderately decreased systolic function (RVEF = 31%). There are no regional wall motion abnormalities. 3.  Moderately dilated left and mildly dilated right atrium. 4. Normal size of the aortic root, ascending aorta. Severely dilated pulmonary artery measuring 45 mm consistent with pulmonary hypertension. 5.  Mild aortic, mitral and tricuspid regurgitation. 6.  Normal pericardium.  No pericardial effusion. MRA for evaluation of aortic coarctation will be performed on 04/19/2020. Electronically Signed   By: Katarina  Nelson   On: 04/18/2020 17:38  ° ° ° °Cardiac Studies  ° °04/18/2020: RHC °Findings: °RA = 15 prominent v-waves °RV = 132/17 °PA =  132/52 (77) °PCW = 31 °Fick cardiac output/index = 2.7/1.35 °PVR = 17.3 WU °FA sat = 99% °PA sat = 49%, 51% °SVC sat = 54% °  °Assessment: °1. Very severe mixed pulmonary HTN °2. Severely reduced CO in setting of cor pulmonale  °3. No evidence of intracardiac shunting °  °Plan/Discussion: °Will diurese. Await MRI. Will d/w [facility] and congenital heart teams. May need IV therapies.  ° ° ° °Cardiac MRI below ° ° ° °04/18/2020: TTE °IMPRESSIONS  °1. Septal flattening suggesting Elevated PA pressures . Left ventricular  °ejection fraction, by estimation, is 45 to 50%. The left ventricle has  °mildly decreased function. The left ventricle demonstrates global  °hypokinesis. The left ventricular internal  °cavity size was mildly dilated. There is severe left ventricular  °hypertrophy. Left ventricular diastolic parameters are indeterminate.  ° 2. Right ventricular systolic function is moderately reduced. The right  °ventricular size is moderately enlarged.  ° 3. Left atrial size was moderately dilated.  ° 4. The mitral valve is normal in structure. Mild mitral valve  °regurgitation. No evidence of mitral stenosis.  ° 5. The aortic valve is normal in structure. Aortic valve regurgitation is  °mild. No aortic stenosis is present.  ° 6. Pulmonic valve regurgitation  is moderate.  ° 7. The inferior vena cava Not seen well.  ° °FINDINGS  ° Left Ventricle: Septal flattening suggesting Elevated PA pressures. Left  °ventricular ejection fraction, by estimation, is 45 to 50%. The left  °ventricle has mildly decreased function. The left ventricle demonstrates  °global hypokinesis. Definity contrast  °agent was given IV to delineate the left ventricular endocardial borders.  °The left ventricular internal cavity size was mildly dilated. There is  °severe left ventricular hypertrophy. Left ventricular diastolic parameters  °are indeterminate.  ° °Right Ventricle: The right ventricular size is moderately enlarged. Right  °vetricular wall thickness was not assessed. Right ventricular systolic  °function is moderately reduced.  ° °Left Atrium: Left atrial size was moderately dilated.  ° °Right Atrium: Right atrial size was normal in size.  ° °Pericardium: There is no evidence of pericardial effusion.  ° °Mitral Valve: The mitral valve is normal in structure. There is mild  °thickening of the mitral valve leaflet(s). There is mild calcification of  °the mitral valve leaflet(s). Normal mobility of the mitral valve leaflets.  °Mild mitral valve regurgitation. No  °evidence of mitral valve stenosis.  ° °Tricuspid Valve: The tricuspid valve is normal in structure. Tricuspid  °valve regurgitation   is trivial. No evidence of tricuspid stenosis.  ° °Aortic Valve: The aortic valve is normal in structure. Aortic valve  °regurgitation is mild. No aortic stenosis is present.  ° °Pulmonic Valve: The pulmonic valve was normal in structure. Pulmonic valve  °regurgitation is moderate. No evidence of pulmonic stenosis.  ° °Aorta: The aortic root is normal in size and structure.  ° °Venous: The inferior vena cava was not well visualized. The inferior vena  °cava Not seen well.  ° °IAS/Shunts: The interatrial septum was not well visualized.  ° °   °LEFT VENTRICLE  °PLAX 2D  °LVIDd:         5.20 cm  °LVIDs:          4.00 cm  °LV PW:         1.30 cm  °LV IVS:        1.40 cm  °LVOT diam:     2.20 cm  °LV SV:         68  °LV SV Index:   31  °LVOT Area:     3.80 cm²  °   ° °RIGHT VENTRICLE  °RV S prime:     7.83 cm/s  ° °LEFT ATRIUM              Index  °LA diam:        4.90 cm  2.22 cm/m²  °LA Vol (A2C):   109.0 ml 49.36 ml/m²  °LA Vol (A4C):   85.2 ml  38.58 ml/m²  °LA Biplane Vol: 98.6 ml  44.65 ml/m²  ° AORTIC VALVE  °LVOT Vmax:   84.20 cm/s  °LVOT Vmean:  56.300 cm/s  °LVOT VTI:    0.180 m  °   °AORTA  °Ao Root diam: 2.70 cm  ° °   °SHUNTS  °Systemic VTI:  0.18 m  °Systemic Diam: 2.20 cm  ° ° °Patient Profile  °   °39 y.o. male with PMHx of (in review of Care everywhere)  Aortic coarctation, s/p surgical repair 1987 (age 5), with residual ascending/descending thoracic aortic dilation on imaging 10/2017, and moderate AI, Ventricular septal defect (small by notes) and subaortic membrane, also repaired in 1987, HFpEF, with hypertrophic cardiomyopathy, denies history of hypertension, Diabetes versus pre-diabetes, and diastolic CHF, ?p.HTN ° °Echo>> LV dysfunction-moderate ° RV pressures elevated ° °RHC  PApressuer 165 ° Resistance 17 Woods units ° RA pressure> 15 ° °cMRI  RVE and RV dysfunction ° LVE-severe,( 65/78mm)  EF 38% ° LGE neg x at site of VSD repair °Assessment & Plan  °  °Complete heart block °  °Cardiomyopathy  EF 40% °  °AI  °  °Hypertrophic    °Pulm HTN--severe °  °Cholecystitis  dont think R heart failure given normal transaminasitis  °  °Coarctation Repair // Subaortic membrane repair Age 5 °            Residual dilitation of ascending descending aorta °  °CHF chronic systolic/diastolic ° °Hypokalemia  ° °Much improved ° °Will plan to proceed with CRT ICD on Wednesday ° °Needs K repletion ° °Still dont have a unifying diagnosis with hypertrophy and pulm HTN -- have reached back out to Dr PNi who read echo and can help correlate with cMRI and spoke today with Dr DB ° ° ° °  °Renelda Kilian  °

## 2020-04-23 NOTE — Progress Notes (Signed)
Received patient via wheelchair from [facility] with a lomg medical history of Complete Heart Block and having several surgeries to repair his heart starting at age 38. Patient is asymptomatic with his low heart rate and monitoring is showing CHB. Patient is scheduled for a right heart cath in AM and V/S remain stable. Due to patient's long history of pulmonary HTN he is requiring 6L of oxygen while laying down via [place]. Sats remain greater than 95% on oxygen. Patient is stable and doesn't feel any difference in his condition since he arrived in the hospital. Will continue to assess this patient very closely and monitor his V/S and stability. Will prepare patient for his Right heart cath planned for tomorrow and Dr. Graciela Husbands indicated in his notes patient will require a pacemaker before his discharge. Monitoring patient closely.

## 2020-04-23 NOTE — Progress Notes (Signed)
  Dr. Graciela Husbands discussed results of cath today with Dr. Gala Romney .  Plan CRT-D on Wednesday, 04/25/2020.  Casimiro Needle [address]" [place], PA-C  04/23/2020 3:26 PM

## 2020-04-23 NOTE — H&P (View-Only) (Signed)
Progress Note  Patient Name: Gabriel Floyd Date of Encounter: 04/23/2020  Primary Cardiologist: Dr. Bing Matter  Subjective    Breathing better RHC today with PA pressure 60 on sildenafil   Inpatient Medications    Scheduled Meds:  Chlorhexidine Gluconate Cloth  6 each Topical Daily   [START ON 04/24/2020] enoxaparin (LOVENOX) injection  40 mg Subcutaneous Q24H   potassium chloride  40 mEq Oral Daily   potassium chloride  40 mEq Oral Once   sildenafil  60 mg Oral TID   sodium chloride flush  10-40 mL Intracatheter Q12H   sodium chloride flush  3 mL Intravenous Q12H   sodium chloride flush  3 mL Intravenous Q12H   spironolactone  25 mg Oral Daily   torsemide  20 mg Oral q1800   [START ON 04/24/2020] torsemide  40 mg Oral Daily   Continuous Infusions:  sodium chloride     sodium chloride     dextrose 5 % and 0.9% NaCl Stopped (04/18/20 1446)   PRN Meds: sodium chloride, sodium chloride, acetaminophen, hydrALAZINE, labetalol, ondansetron (ZOFRAN) IV, sodium chloride flush, sodium chloride flush, sodium chloride flush, sodium chloride flush   Vital Signs    Vitals:   04/23/20 1100 04/23/20 1200 04/23/20 1217 04/23/20 1553  BP: (!) 94/58  120/79 106/68  Pulse: (!) 47  (!) 51 88  Resp: 18  16 18   Temp:   98.2 F (36.8 C) 98 F (36.7 C)  TempSrc:   Oral Oral  SpO2: 100%  94% 92%  Weight:  100.3 kg    Height:        Intake/Output Summary (Last 24 hours) at 04/23/2020 1704 Last data filed at 04/23/2020 0400 Gross per 24 hour  Intake 43.77 ml  Output 650 ml  Net -606.23 ml   Last 3 Weights 04/23/2020 04/22/2020 04/21/2020  Weight (lbs) 221 lb 3.2 oz 226 lb 10.1 oz 228 lb 6.3 oz  Weight (kg) 100.336 kg 102.8 kg 103.6 kg      Telemetry    Personally reviewed  *CHB   ECG    No new EKGs - Personally Reviewed  Physical Exam  Well developed and nourished in no acute distress HENT normal Neck supple   Clear Regular rate and rhythm, no murmurs or  gallops Abd-soft with active BS No Clubbing cyanosis edema Skin-warm and dry A & Oriented  Grossly normal sensory and motor function   *      Labs    High Sensitivity Troponin:  No results for input(s): TROPONINIHS in the last 720 hours.    Chemistry Recent Labs  Lab 04/21/20 0440 04/21/20 0440 04/22/20 0450 04/22/20 0450 04/23/20 0500 04/23/20 1059 04/23/20 1100  NA 137   < > 132*   < > 134* 139 138  K 3.4*   < > 3.3*   < > 3.1* 3.1* 3.3*  CL 98  --  92*  --  91*  --   --   CO2 30  --  31  --  30  --   --   GLUCOSE 180*  --  180*  --  201*  --   --   BUN 16  --  21*  --  29*  --   --   CREATININE 1.18  --  1.16  --  1.34*  --   --   CALCIUM 8.7*  --  9.2  --  9.3  --   --   PROT 6.1*  --  7.2  --  7.6  --   --   ALBUMIN 3.3*  --  3.8  --  3.8  --   --   AST 45*  --  34  --  29  --   --   ALT 61*  --  52*  --  40  --   --   ALKPHOS 46  --  53  --  66  --   --   BILITOT 1.7*  --  1.8*  --  1.2  --   --   GFRNONAA >60  --  >60  --  >60  --   --   GFRAA >60  --  >60  --  >60  --   --   ANIONGAP 9  --  9  --  13  --   --    < > = values in this interval not displayed.     Hematology Recent Labs  Lab 04/18/20 0238 04/18/20 1702 04/19/20 0701 04/23/20 1059 04/23/20 1100  WBC 11.3*  --  8.7  --   --   RBC 5.14  --  4.92  --   --   HGB 14.8   < > 14.2 16.7 17.0  HCT 45.2   < > 42.9 49.0 50.0  MCV 87.9  --  87.2  --   --   MCH 28.8  --  28.9  --   --   MCHC 32.7  --  33.1  --   --   RDW 13.8  --  13.7  --   --   PLT 190  --  177  --   --    < > = values in this interval not displayed.    BNP Recent Labs  Lab 04/18/20 0238  BNP 1,040.2*     DDimer No results for input(s): DDIMER in the last 168 hours.   Radiology     MR CARDIAC MORPHOLOGY W WO CONTRAST Result Date: 04/18/2020 CLINICAL DATA:  39 year old male with h/o repaired aortic coarctation, NICM, acute on chronic systolic and diastolic CHF and complete heart block. EXAM: CARDIAC MRI TECHNIQUE:  The patient was scanned on a 1.5 Tesla GE magnet. A dedicated cardiac coil was used. Functional imaging was done using Fiesta sequences. 2,3, and 4 chamber views were done to assess for RWMA's. Modified Simpson's rule using a short axis stack was used to calculate an ejection fraction on a dedicated work Conservation officer, nature. The patient received 10 cc of Gadavist. After 10 minutes inversion recovery sequences were used to assess for infiltration and scar tissue. CONTRAST:  10 cc  of Gadavist FINDINGS: 1. Severely dilated left ventricle with moderate basal septal hypertrophy and moderately decreased systolic function (LVEF = 38%) with diffuse hypokinesis and no regional wall motion abnormalities. There is late gadolinium enhancement in the membranous portion of the interventricular septum - possibly representing prior VSD repair. No LGE is seen in the left ventricular myocardium. LVEDD: 78 mm LVESD: 65 mm LVEDV: 372 ml LVESV: 228 ml SV: 144 ml CO: 6.3 L/min Myocardial mass: 219 g 2. Moderately dilated right ventricle with significant hypertrabeculations and moderately decreased systolic function (RVEF = 31%). There are no regional wall motion abnormalities. 3.  Moderately dilated left and mildly dilated right atrium. 4. Normal size of the aortic root, ascending aorta. Severely dilated pulmonary artery measuring 45 mm consistent with pulmonary hypertension. 5.  Mild aortic, mitral and tricuspid regurgitation. 6.  Normal pericardium.  No pericardial effusion. IMPRESSION:  1. Severely dilated left ventricle with moderate basal septal hypertrophy and moderately decreased systolic function (LVEF = 38%) with diffuse hypokinesis and no regional wall motion abnormalities. There is late gadolinium enhancement in the membranous portion of the interventricular septum - possibly representing prior VSD repair. No LGE is seen in the left ventricular myocardium. 2. Moderately dilated right ventricle with significant  hypertrabeculations and moderately decreased systolic function (RVEF = 31%). There are no regional wall motion abnormalities. 3.  Moderately dilated left and mildly dilated right atrium. 4. Normal size of the aortic root, ascending aorta. Severely dilated pulmonary artery measuring 45 mm consistent with pulmonary hypertension. 5.  Mild aortic, mitral and tricuspid regurgitation. 6.  Normal pericardium.  No pericardial effusion. MRA for evaluation of aortic coarctation will be performed on 04/19/2020. Electronically Signed   By: Tobias Alexander   On: 04/18/2020 17:38     Cardiac Studies   04/18/2020: RHC Findings: RA = 15 prominent v-waves RV = 132/17 PA =  132/52 (77) PCW = 31 Fick cardiac output/index = 2.7/1.35 PVR = 17.3 WU FA sat = 99% PA sat = 49%, 51% SVC sat = 54%   Assessment: 1. Very severe mixed pulmonary HTN 2. Severely reduced CO in setting of cor pulmonale  3. No evidence of intracardiac shunting   Plan/Discussion: Will diurese. Await MRI. Will d/w [facility] and congenital heart teams. May need IV therapies.     Cardiac MRI below    04/18/2020: TTE IMPRESSIONS  1. Septal flattening suggesting Elevated PA pressures . Left ventricular  ejection fraction, by estimation, is 45 to 50%. The left ventricle has  mildly decreased function. The left ventricle demonstrates global  hypokinesis. The left ventricular internal  cavity size was mildly dilated. There is severe left ventricular  hypertrophy. Left ventricular diastolic parameters are indeterminate.   2. Right ventricular systolic function is moderately reduced. The right  ventricular size is moderately enlarged.   3. Left atrial size was moderately dilated.   4. The mitral valve is normal in structure. Mild mitral valve  regurgitation. No evidence of mitral stenosis.   5. The aortic valve is normal in structure. Aortic valve regurgitation is  mild. No aortic stenosis is present.   6. Pulmonic valve regurgitation  is moderate.   7. The inferior vena cava Not seen well.   FINDINGS   Left Ventricle: Septal flattening suggesting Elevated PA pressures. Left  ventricular ejection fraction, by estimation, is 45 to 50%. The left  ventricle has mildly decreased function. The left ventricle demonstrates  global hypokinesis. Definity contrast  agent was given IV to delineate the left ventricular endocardial borders.  The left ventricular internal cavity size was mildly dilated. There is  severe left ventricular hypertrophy. Left ventricular diastolic parameters  are indeterminate.   Right Ventricle: The right ventricular size is moderately enlarged. Right  vetricular wall thickness was not assessed. Right ventricular systolic  function is moderately reduced.   Left Atrium: Left atrial size was moderately dilated.   Right Atrium: Right atrial size was normal in size.   Pericardium: There is no evidence of pericardial effusion.   Mitral Valve: The mitral valve is normal in structure. There is mild  thickening of the mitral valve leaflet(s). There is mild calcification of  the mitral valve leaflet(s). Normal mobility of the mitral valve leaflets.  Mild mitral valve regurgitation. No  evidence of mitral valve stenosis.   Tricuspid Valve: The tricuspid valve is normal in structure. Tricuspid  valve regurgitation  is trivial. No evidence of tricuspid stenosis.   Aortic Valve: The aortic valve is normal in structure. Aortic valve  regurgitation is mild. No aortic stenosis is present.   Pulmonic Valve: The pulmonic valve was normal in structure. Pulmonic valve  regurgitation is moderate. No evidence of pulmonic stenosis.   Aorta: The aortic root is normal in size and structure.   Venous: The inferior vena cava was not well visualized. The inferior vena  cava Not seen well.   IAS/Shunts: The interatrial septum was not well visualized.      LEFT VENTRICLE  PLAX 2D  LVIDd:         5.20 cm  LVIDs:          4.00 cm  LV PW:         1.30 cm  LV IVS:        1.40 cm  LVOT diam:     2.20 cm  LV SV:         68  LV SV Index:   31  LVOT Area:     3.80 cm      RIGHT VENTRICLE  RV S prime:     7.83 cm/s   LEFT ATRIUM              Index  LA diam:        4.90 cm  2.22 cm/m  LA Vol (A2C):   109.0 ml 49.36 ml/m  LA Vol (A4C):   85.2 ml  38.58 ml/m  LA Biplane Vol: 98.6 ml  44.65 ml/m   AORTIC VALVE  LVOT Vmax:   84.20 cm/s  LVOT Vmean:  56.300 cm/s  LVOT VTI:    0.180 m     AORTA  Ao Root diam: 2.70 cm      SHUNTS  Systemic VTI:  0.18 m  Systemic Diam: 2.20 cm    Patient Profile     39 y.o. male with PMHx of (in review of Care everywhere)  Aortic coarctation, s/p surgical repair 34 (age 52), with residual ascending/descending thoracic aortic dilation on imaging 10/2017, and moderate AI, Ventricular septal defect (small by notes) and subaortic membrane, also repaired in 1987, HFpEF, with hypertrophic cardiomyopathy, denies history of hypertension, Diabetes versus pre-diabetes, and diastolic CHF, ?p.HTN  Echo>> LV dysfunction-moderate  RV pressures elevated  RHC  PApressuer 165  Resistance 17 Woods units  RA pressure> 15  cMRI  RVE and RV dysfunction  LVE-severe,( 65/49mm)  EF 38%  LGE neg x at site of VSD repair Assessment & Plan    Complete heart block   Cardiomyopathy  EF 40%   AI    Hypertrophic    Pulm HTN--severe   Cholecystitis  dont think R heart failure given normal transaminasitis    Coarctation Repair // Subaortic membrane repair Age 87             Residual dilitation of ascending descending aorta   CHF chronic systolic/diastolic  Hypokalemia   Much improved  Will plan to proceed with CRT ICD on Wednesday  Needs K repletion  Still dont have a unifying diagnosis with hypertrophy and pulm HTN -- have reached back out to Dr Genene Churn who read echo and can help correlate with cMRI and spoke today with Dr Dorthea Cove      Sherryl Manges

## 2020-04-23 NOTE — Interval H&P Note (Signed)
History and Physical Interval Note:  04/23/2020 10:10 AM  Gabriel Floyd  has presented today for surgery, with the diagnosis of heart failure.  The various methods of treatment have been discussed with the patient and family. After consideration of risks, benefits and other options for treatment, the patient has consented to  Procedure(s): RIGHT HEART CATH (N/A) as a surgical intervention.  The patient's history has been reviewed, patient examined, no change in status, stable for surgery.  I have reviewed the patient's chart and labs.  Questions were answered to the patient's satisfaction.     Gabriel Floyd

## 2020-04-23 NOTE — Plan of Care (Signed)
  Problem: Education: Goal: Ability to demonstrate management of disease process will improve Outcome: Progressing Goal: Ability to verbalize understanding of medication therapies will improve Outcome: Progressing   Problem: Activity: Goal: Capacity to carry out activities will improve Outcome: Progressing   Problem: Cardiac: Goal: Ability to achieve and maintain adequate cardiopulmonary perfusion will improve Outcome: Progressing   

## 2020-04-24 ENCOUNTER — Inpatient Hospital Stay (HOSPITAL_COMMUNITY): Payer: Self-pay

## 2020-04-24 LAB — BASIC METABOLIC PANEL
Anion gap: 11 (ref 5–15)
BUN: 30 mg/dL — ABNORMAL HIGH (ref 6–20)
CO2: 29 mmol/L (ref 22–32)
Calcium: 9.5 mg/dL (ref 8.9–10.3)
Chloride: 94 mmol/L — ABNORMAL LOW (ref 98–111)
Creatinine, Ser: 1.27 mg/dL — ABNORMAL HIGH (ref 0.61–1.24)
GFR calc Af Amer: >60 mL/min (ref >60)
GFR calc non Af Amer: >60 mL/min (ref >60)
Glucose, Bld: 176 mg/dL — ABNORMAL HIGH (ref 70–99)
Potassium: 3.7 mmol/L (ref 3.5–5.1)
Sodium: 134 mmol/L — ABNORMAL LOW (ref 135–145)

## 2020-04-24 MED ORDER — SODIUM CHLORIDE 0.9 % IV SOLN: INTRAVENOUS | Status: DC

## 2020-04-24 MED ORDER — CHLORHEXIDINE GLUCONATE 4 % EX LIQD
60.0000 mL | Freq: Once | CUTANEOUS | Status: DC
  Filled 2020-04-24: qty 60

## 2020-04-24 MED ORDER — POTASSIUM CHLORIDE CRYS ER 20 MEQ PO TBCR
40.0000 meq | EXTENDED_RELEASE_TABLET | Freq: Two times a day (BID) | ORAL | Status: DC
  Administered 2020-04-24 – 2020-04-26 (×4): 40 meq via ORAL
  Filled 2020-04-24 (×4): qty 2

## 2020-04-24 MED ORDER — CHLORHEXIDINE GLUCONATE 4 % EX LIQD
60.0000 mL | Freq: Once | CUTANEOUS | Status: AC
  Administered 2020-04-24: 4 via TOPICAL

## 2020-04-24 MED ORDER — CEFAZOLIN SODIUM-DEXTROSE 2-4 GM/100ML-% IV SOLN
2.0000 g | INTRAVENOUS | Status: AC
  Administered 2020-04-25: 2 g via INTRAVENOUS

## 2020-04-24 MED ORDER — SODIUM CHLORIDE 0.9 % IV SOLN
80.0000 mg | INTRAVENOUS | Status: AC
  Administered 2020-04-25: 80 mg
  Filled 2020-04-24: qty 2

## 2020-04-24 MED ORDER — TECHNETIUM TO 99M ALBUMIN AGGREGATED
1.6000 | Freq: Once | INTRAVENOUS | Status: AC | PRN
  Administered 2020-04-24: 1.6 via INTRAVENOUS

## 2020-04-24 NOTE — Progress Notes (Addendum)
Advanced Heart Failure Rounding Note  PCP-Cardiologist: No primary care provider on file.    Patient Profile   [name] is a 39 y.o. 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   Admitted to [facility] 5/21 for acute cholecystitis and found to be in CHB=>>transfered to [facility] for potential PPM.   Echo EF 45-50% with severe LVH. RV dilated and moderate HK with severe septal flattening c/w RV pressure volume overload LV PW 1.30 cm LV IVS1.40 cm   RHC 04/18/20 with PAP 132/77 PCWP 31  5/20 Started on milrinone.   Repeat RHC 5/24 on milrinone 0.125 RA 3 RV 69/4 PA 68/23 (41) PCWP 22 Fick 5.1/2.3   Subjective:    Milrinone discontinued yesterday and sildenafil increased to 60 mg tid. No co-ox drawn today. PICC line has been removed.   -1.2L in UOP yesterday w/ change to PO torsemide. Wt up 2 lb. SCr trending down.   Scheduled to get CRT ICD tomorrow.   He feels well. Ambulating around his room. Denies dyspnea. No dizziness/ syncope/ near syncope.    Objective:   Weight Range: 101.5 kg Body mass index is 33.04 kg/m.   Vital Signs:   Temp:  [97.8 F (36.6 C)-99.6 F (37.6 C)] 98.8 F (37.1 C) (05/25 0336) Pulse Rate:  [23-88] 43 (05/25 0336) Resp:  [0-34] 18 (05/25 0336) BP: (92-134)/(53-108) 95/53 (05/25 0336) SpO2:  [0 %-100 %] 95 % (05/25 0336) Weight:  [100.3 kg-101.5 kg] 101.5 kg (05/25 0336) Last BM Date: 04/23/20  Weight change: Filed  Weights   04/22/20 0500 04/23/20 1200 04/24/20 0336  Weight: 102.8 kg 100.3 kg 101.5 kg    Intake/Output:   Intake/Output Summary (Last 24 hours) at 04/24/2020 0709 Last data filed at 04/24/2020 0347 Gross per 24 hour  Intake 1090 ml  Output 1225 ml  Net -135 ml      Physical Exam   General:  Well appearing, moderately obese. No resp difficulty HEENT: normal Neck: supple. no JVD. Carotids 2+ bilat; no bruits. No lymphadenopathy or thryomegaly appreciated. Cor: PMI nondisplaced. Regular rate & rhythm. No rubs, gallops or murmurs. Lungs: clear, no wheezing  Abdomen: obese but soft, nontender, nondistended. No hepatosplenomegaly. No bruits or masses. Good bowel sounds. Extremities: no cyanosis, clubbing, rash, edema Neuro: alert & orientedx3, cranial nerves grossly intact. moves all 4 extremities w/o difficulty. Affect pleasant   Telemetry   CHB upper 40s Personally reviewed  Labs    CBC Recent Labs    04/23/20 1059 04/23/20 1100  HGB 16.7 17.0  HCT 49.0 50.0   Basic Metabolic Panel Recent Labs    51/89/84 0500 04/23/20 1059 04/23/20 1100 04/24/20 0235  NA 134*   < > 138 134*  K 3.1*   < > 3.3* 3.7  CL 91*  --   --  94*  CO2 30  --   --  29  GLUCOSE 201*  --   --  176*  BUN 29*  --   --  30*  CREATININE  1.34*  --   --  1.27*  CALCIUM 9.3  --   --  9.5   < > = values in this interval not displayed.   Liver Function Tests Recent Labs    04/22/20 0450 04/23/20 0500  AST 34 29  ALT 52* 40  ALKPHOS 53 66  BILITOT 1.8* 1.2  PROT 7.2 7.6  ALBUMIN 3.8 3.8   No results for input(s): LIPASE, AMYLASE in the last 72 hours. Cardiac Enzymes No results for input(s): CKTOTAL, CKMB, CKMBINDEX, TROPONINI in the last 72 hours.  BNP: BNP (last 3 results) Recent Labs    04/18/20 0238  BNP 1,040.2*    ProBNP (last 3 results) No results for input(s): PROBNP in the last 8760 hours.   D-Dimer No results for input(s): DDIMER in the last 72 hours. Hemoglobin  A1C No results for input(s): HGBA1C in the last 72 hours. Fasting Lipid Panel No results for input(s): CHOL, HDL, LDLCALC, TRIG, CHOLHDL, LDLDIRECT in the last 72 hours. Thyroid Function Tests No results for input(s): TSH, T4TOTAL, T3FREE, THYROIDAB in the last 72 hours.  Invalid input(s): FREET3  Other results:   Imaging    CARDIAC CATHETERIZATION  Result Date: 04/23/2020 Findings: On milrinone 0.125 mcg/kg/min and sildenafil 40 tid RA = 3 RV =69/4 PA = 68/23 (41) PCW = 22 Fick cardiac output/index = 5.1/2.3 PVR = 3.8 WU Ao sat = 99% PA sat = 68%, 70% Assessment: 1. Much improved hemodynamics 2. Now with moderate mixed PH Plan/Discussion: Continue to titrate meds. Glori Bickers, MD 11:28 AM  DG CHEST PORT 1 VIEW  Result Date: 04/23/2020 CLINICAL DATA:  Shortness of breath EXAM: PORTABLE CHEST 1 VIEW COMPARISON:  04/19/2020 FINDINGS: Cardiomegaly. No frank interstitial edema. Mild bibasilar opacities, likely atelectasis. No pleural effusion or pneumothorax. Right arm PICC terminates at the cavoatrial junction. IMPRESSION: No evidence of acute cardiopulmonary disease. Electronically Signed   By: Julian Hy M.D.   On: 04/23/2020 12:49     Medications:     Scheduled Medications: . Chlorhexidine Gluconate Cloth  6 each Topical Daily  . enoxaparin (LOVENOX) injection  40 mg Subcutaneous Q24H  . potassium chloride  40 mEq Oral Daily  . potassium chloride  40 mEq Oral Once  . sildenafil  60 mg Oral TID  . sodium chloride flush  10-40 mL Intracatheter Q12H  . sodium chloride flush  3 mL Intravenous Q12H  . sodium chloride flush  3 mL Intravenous Q12H  . spironolactone  25 mg Oral Daily  . torsemide  20 mg Oral q1800  . torsemide  40 mg Oral Daily    Infusions: . sodium chloride    . sodium chloride    . dextrose 5 % and 0.9% NaCl Stopped (04/18/20 1446)    PRN Medications: sodium chloride, sodium chloride, acetaminophen, ondansetron (ZOFRAN) IV, sodium chloride  flush, sodium chloride flush, sodium chloride flush, sodium chloride flush    Assessment/Plan   1. Biventricular HF with severe pulmonary HTN - unifying diagnosis remains unclear. There is no evidence of residual shunt or infiltrative process on cMRI/MRA or RHC - aside from GI symptoms he has had surprisingly few symptoms despite severe PH and very low output - Repeat RHC 5/24 with much improved hemodynamics - Milrinone discontinued 5/24.  - Continue sildenafil 60 tid  - Continue po torsemide 40/20 daily - Continue spiro  - Will need to assess for OSA and need for home O2 support - serologies negative  - VQ today - I d/w  [facility] PAH and congenital teams  -Plan to add macitentan as outpatient. Needs assessment for home O2.   2. CHB - will need CRT prior to d/c. D/w Dr. Graciela Husbands. Will plan Wednesday - currently hemodynamically stable  3. GI symptoms - initially felt to be acute cholecystitis but suspect related to HF/ hepatic congestion - AST/ALT have normalized with treatment of PAH - Off zosyn   4. Renal Insuffiencey - SCr 1.44 peaked - suspect cardiorenal  - SCr trending down, 1.27 today   5. Hypokalemia  - resolved. K 3.7 today    Length of Stay: [address] PA-C  04/24/2020, 7:09 AM  Advanced Heart Failure Team Pager [phone] (M-F; 7a - 4p)  Please contact [facility] for night-coverage after hours (4p -7a ) and weekends on [email or web address]  Patient seen and examined with the above-signed Advanced Practice Provider and/or Housestaff. I personally reviewed laboratory data, imaging studies and relevant notes. I independently examined the patient and formulated the important aspects of the plan. I have edited the note to reflect any of my changes or salient points. I have personally discussed the plan with the patient and/or family.  RHC numbers from yesterday were much better. I have reviewed with him. Milrinone now off. Sildenafil increased to 60 tid. Remains in  CHB.   General:  Well appearing. No resp difficulty HEENT: normal Neck: supple. no JVD. Carotids 2+ bilat; no bruits. No lymphadenopathy or thryomegaly appreciated. Cor: PMI nondisplaced. Regular brady No rubs, gallops or murmurs. Lungs: clear Abdomen: soft, nontender, nondistended. No hepatosplenomegaly. No bruits or masses. Good bowel sounds. Extremities: no cyanosis, clubbing, rash, edema Neuro: alert & orientedx3, cranial nerves grossly intact. moves all 4 extremities w/o difficulty. Affect pleasant  PH has stabilized. Now off milrinone. Continue sildenafil. Aff macitentan as outpatient if he can afford. VQ study today. Pacer tomorrow. Will need assessment for ambulatory O2 prior to d/c.   Arvilla Meres, MD  8:16 AM       .

## 2020-04-24 NOTE — Progress Notes (Addendum)
Pt feeling well this am.   Remains in CHB with rates in 40s with cardiomyopathy.  Discussed plan and goals of ICD insertion. Discussed benefits and risks of defibrillator implantation including but not limited to bleeding, infection, pneumothorax, pericardial effusion, heart attack, stroke, and death.   Pt verbalizes understanding and agrees to proceed. Plan for device implantation tomorrow am, 04/24/2020.  Casimiro Needle [address] [place], PA-C  04/24/2020 8:56 AM

## 2020-04-24 NOTE — Plan of Care (Signed)
  Problem: Activity: Goal: Capacity to carry out activities will improve Outcome: Progressing   Problem: Cardiac: Goal: Ability to achieve and maintain adequate cardiopulmonary perfusion will improve Outcome: Progressing   Problem: Education: Goal: Knowledge of General Education information will improve Description: Including pain rating scale, medication(s)/side effects and non-pharmacologic comfort measures Outcome: Progressing   Problem: Clinical Measurements: Goal: Will remain free from infection Outcome: Progressing Goal: Respiratory complications will improve Outcome: Progressing   Problem: Activity: Goal: Risk for activity intolerance will decrease Outcome: Progressing   Problem: Nutrition: Goal: Adequate nutrition will be maintained Outcome: Progressing   Problem: Coping: Goal: Level of anxiety will decrease Outcome: Progressing

## 2020-04-25 ENCOUNTER — Ambulatory Visit (HOSPITAL_COMMUNITY): Admission: RE | Admit: 2020-04-25 | Payer: Self-pay | Source: Home / Self Care | Admitting: Internal Medicine

## 2020-04-25 ENCOUNTER — Inpatient Hospital Stay (HOSPITAL_COMMUNITY)
Admission: AD | Disposition: A | Discharge status: Home / Self Care | Payer: Self-pay | Source: Other Acute Inpatient Hospital | Attending: Internal Medicine

## 2020-04-25 DIAGNOSIS — I5082 Biventricular heart failure: Secondary | ICD-10-CM

## 2020-04-25 DIAGNOSIS — I442 Atrioventricular block, complete: Secondary | ICD-10-CM

## 2020-04-25 HISTORY — PX: BIV ICD INSERTION CRT-D: EP1195

## 2020-04-25 LAB — CBC
HCT: 46.7 % (ref 39.0–52.0)
Hemoglobin: 15.8 g/dL (ref 13.0–17.0)
MCH: 28.8 pg (ref 26.0–34.0)
MCHC: 33.8 g/dL (ref 30.0–36.0)
MCV: 85.2 fL (ref 80.0–100.0)
Platelets: 194 10*3/uL (ref 150–400)
RBC: 5.48 MIL/uL (ref 4.22–5.81)
RDW: 13.1 % (ref 11.5–15.5)
WBC: 8 10*3/uL (ref 4.0–10.5)
nRBC: 0 % (ref 0.0–0.2)

## 2020-04-25 LAB — BASIC METABOLIC PANEL
Anion gap: 11 (ref 5–15)
BUN: 31 mg/dL — ABNORMAL HIGH (ref 6–20)
CO2: 26 mmol/L (ref 22–32)
Calcium: 9.4 mg/dL (ref 8.9–10.3)
Chloride: 95 mmol/L — ABNORMAL LOW (ref 98–111)
Creatinine, Ser: 1.21 mg/dL (ref 0.61–1.24)
GFR calc Af Amer: >60 mL/min (ref >60)
GFR calc non Af Amer: >60 mL/min (ref >60)
Glucose, Bld: 186 mg/dL — ABNORMAL HIGH (ref 70–99)
Potassium: 3.9 mmol/L (ref 3.5–5.1)
Sodium: 132 mmol/L — ABNORMAL LOW (ref 135–145)

## 2020-04-25 LAB — SURGICAL PCR SCREEN
MRSA, PCR: NEGATIVE
Staphylococcus aureus: NEGATIVE

## 2020-04-25 SURGERY — BIV ICD INSERTION CRT-D

## 2020-04-25 MED ORDER — LIDOCAINE HCL 1 % IJ SOLN
INTRAMUSCULAR | Status: AC
  Filled 2020-04-25: qty 60

## 2020-04-25 MED ORDER — HEPARIN (PORCINE) IN NACL 1000-0.9 UT/500ML-% IV SOLN
INTRAVENOUS | Status: AC
  Filled 2020-04-25: qty 500

## 2020-04-25 MED ORDER — FENTANYL CITRATE (PF) 100 MCG/2ML IJ SOLN
INTRAMUSCULAR | Status: DC | PRN
  Administered 2020-04-25 (×3): 25 ug via INTRAVENOUS

## 2020-04-25 MED ORDER — LIDOCAINE HCL (PF) 1 % IJ SOLN
INTRAMUSCULAR | Status: DC | PRN
  Administered 2020-04-25: 60 mL

## 2020-04-25 MED ORDER — FENTANYL CITRATE (PF) 100 MCG/2ML IJ SOLN
INTRAMUSCULAR | Status: AC
  Filled 2020-04-25: qty 2

## 2020-04-25 MED ORDER — CHLORHEXIDINE GLUCONATE 4 % EX LIQD
60.0000 mL | Freq: Once | CUTANEOUS | Status: AC
  Administered 2020-04-25: 4 via TOPICAL
  Filled 2020-04-25: qty 60

## 2020-04-25 MED ORDER — MIDAZOLAM HCL 5 MG/5ML IJ SOLN
INTRAMUSCULAR | Status: AC
  Filled 2020-04-25: qty 5

## 2020-04-25 MED ORDER — ONDANSETRON HCL 4 MG/2ML IJ SOLN: 4.0000 mg | Freq: Four times a day (QID) | INTRAMUSCULAR | Status: DC | PRN

## 2020-04-25 MED ORDER — MIDAZOLAM HCL 5 MG/5ML IJ SOLN
INTRAMUSCULAR | Status: DC | PRN
  Administered 2020-04-25: 2 mg via INTRAVENOUS
  Administered 2020-04-25: 1 mg via INTRAVENOUS
  Administered 2020-04-25: 2 mg via INTRAVENOUS

## 2020-04-25 MED ORDER — ACETAMINOPHEN 325 MG PO TABS: 325.0000 mg | ORAL_TABLET | ORAL | Status: DC | PRN

## 2020-04-25 MED ORDER — IOHEXOL 350 MG/ML SOLN
INTRAVENOUS | Status: DC | PRN
  Administered 2020-04-25: 50 mL

## 2020-04-25 MED ORDER — CEFAZOLIN SODIUM-DEXTROSE 1-4 GM/50ML-% IV SOLN
1.0000 g | Freq: Four times a day (QID) | INTRAVENOUS | Status: AC
  Administered 2020-04-25 – 2020-04-26 (×3): 1 g via INTRAVENOUS
  Filled 2020-04-25 (×3): qty 50

## 2020-04-25 MED ORDER — HEPARIN (PORCINE) IN NACL 1000-0.9 UT/500ML-% IV SOLN
INTRAVENOUS | Status: DC | PRN
  Administered 2020-04-25: 500 mL

## 2020-04-25 SURGICAL SUPPLY — 19 items
ATTRACTOMAT 16X20 MAGNETIC DRP (DRAPES) ×2 IMPLANT
CABLE SURGICAL S-101-97-12 (CABLE) ×3 IMPLANT
CATH CPS DIRECT 135 DS2C020 (CATHETERS) ×2 IMPLANT
GUIDEWIRE ANGLED .035X150CM (WIRE) ×2 IMPLANT
HEMOSTAT SURGICEL 2X4 FIBR (HEMOSTASIS) ×2 IMPLANT
ICD GALLANT HFCRTD CDHFA500Q (ICD Generator) ×2 IMPLANT
LEAD DURATA 7122Q-65CM (Lead) ×2 IMPLANT
LEAD QUARTET 1456Q-86 (Lead) IMPLANT
LEAD TENDRIL MRI 52CM LPA1200M (Lead) ×2 IMPLANT
PAD PRO RADIOLUCENT 2001M-C (PAD) ×3 IMPLANT
QUARTET 1456Q-86 (Lead) ×3 IMPLANT
SHEATH 7FR PRELUDE SNAP 13 (SHEATH) IMPLANT
SHEATH 8FR PRELUDE SNAP 13 (SHEATH) ×4 IMPLANT
SHEATH 9.5FR PRELUDE SNAP 13 (SHEATH) ×2 IMPLANT
SHEATH WORLEY 9FR 62CM (SHEATH) ×2 IMPLANT
SLITTER UNIVERSAL DS2A003 (MISCELLANEOUS) ×2 IMPLANT
TRAY PACEMAKER INSERTION (PACKS) ×3 IMPLANT
WIRE ACUITY WHISPER EDS 4648 (WIRE) ×4 IMPLANT
WIRE HI TORQ VERSACORE-J 145CM (WIRE) ×2 IMPLANT

## 2020-04-25 NOTE — Plan of Care (Signed)
  Problem: Education: Goal: Ability to demonstrate management of disease process will improve Outcome: Progressing   Problem: Activity: Goal: Capacity to carry out activities will improve Outcome: Progressing   Problem: Education: Goal: Knowledge of General Education information will improve Description: Including pain rating scale, medication(s)/side effects and non-pharmacologic comfort measures Outcome: Progressing   Problem: Health Behavior/Discharge Planning: Goal: Ability to manage health-related needs will improve Outcome: Progressing   Problem: Clinical Measurements: Goal: Respiratory complications will improve Outcome: Progressing   Problem: Activity: Goal: Risk for activity intolerance will decrease Outcome: Progressing

## 2020-04-25 NOTE — Progress Notes (Addendum)
Advanced Heart Failure Rounding Note  PCP-Cardiologist: No primary care provider on file.    Patient Profile   Gabriel Floyd is a 39 y.o. 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   Admitted to [facility] 5/21 for acute cholecystitis and found to be in CHB=>>transfered to [facility] for potential PPM.   Echo EF 45-50% with severe LVH. RV dilated and moderate HK with severe septal flattening c/w RV pressure volume overload LV PW 1.30 cm LV IVS1.40 cm   RHC 04/18/20 with PAP 132/77 PCWP 31  5/20 Started on milrinone.   Repeat RHC 5/24 on milrinone 0.125 RA 3 RV 69/4 PA 68/23 (41) PCWP 22 Fick 5.1/2.3   Subjective:    Denies SOB. No complaints.    Objective:   Weight Range: 101.5 kg Body mass index is 33.04 kg/m.   Vital Signs:   Temp:  [97.8 F (36.6 C)-98.6 F (37 C)] 97.9 F (36.6 C) (05/26 0710) Pulse Rate:  [43-49] 49 (05/26 0710) Resp:  [15-24] 20 (05/26 0710) BP: (90-115)/(46-82) 90/46 (05/26 0710) SpO2:  [92 %-95 %] 95 % (05/26 0710) Weight:  [101.5 kg] 101.5 kg (05/26 0446) Last BM Date: 04/23/20  Weight change: Filed Weights   04/23/20 1200 04/24/20 0336 04/25/20 0446  Weight: 100.3 kg 101.5 kg 101.5 kg    Intake/Output:   Intake/Output Summary (Last 24 hours) at 04/25/2020 0927 Last data filed at 04/25/2020 0918 Gross per 24 hour  Intake 1330 ml  Output 1125 ml  Net 205 ml      Physical Exam   General:  Standing in the room. No  resp difficulty HEENT: normal Neck: supple. no JVD. Carotids 2+ bilat; no bruits. No lymphadenopathy or thryomegaly appreciated. Cor: PMI nondisplaced. Regular rate & rhythm. No rubs, gallops or murmurs. Lungs: clear Abdomen: soft, nontender, nondistended. No hepatosplenomegaly. No bruits or masses. Good bowel sounds. Extremities: no cyanosis, clubbing, rash, edema Neuro: alert & orientedx3, cranial nerves grossly intact. moves all 4 extremities w/o difficulty. Affect pleasant   Telemetry   CHB 40s   Labs    CBC Recent Labs    04/23/20 1100 04/25/20 0312  WBC  --  8.0  HGB 17.0 15.8  HCT 50.0 46.7  MCV  --  85.2  PLT  --  194   Basic Metabolic Panel Recent Labs    23/30/07 0235 04/25/20 0312  NA 134* 132*  K 3.7 3.9  CL 94* 95*  CO2 29 26  GLUCOSE 176* 186*  BUN 30* 31*  CREATININE 1.27* 1.21  CALCIUM 9.5 9.4   Liver Function Tests Recent Labs    04/23/20 0500  AST 29  ALT 40  ALKPHOS 66  BILITOT 1.2  PROT 7.6  ALBUMIN 3.8   No results for input(s): LIPASE, AMYLASE in the last 72 hours. Cardiac Enzymes No results for input(s): CKTOTAL, CKMB, CKMBINDEX, TROPONINI in the last 72 hours.  BNP: BNP (last 3 results) Recent Labs    04/18/20 0238  BNP 1,040.2*    ProBNP (last  3 results) No results for input(s): PROBNP in the last 8760 hours.   D-Dimer No results for input(s): DDIMER in the last 72 hours. Hemoglobin A1C No results for input(s): HGBA1C in the last 72 hours. Fasting Lipid Panel No results for input(s): CHOL, HDL, LDLCALC, TRIG, CHOLHDL, LDLDIRECT in the last 72 hours. Thyroid Function Tests No results for input(s): TSH, T4TOTAL, T3FREE, THYROIDAB in the last 72 hours.  Invalid input(s): FREET3  Other results:   Imaging    NM Pulmonary Perfusion  Result Date: 04/24/2020 CLINICAL DATA:  Short of breath. Cardiac catheterization 1 day prior. EXAM: NUCLEAR MEDICINE PERFUSION LUNG SCAN TECHNIQUE: Perfusion images were obtained in  multiple projections after intravenous injection of radiopharmaceutical. Ventilation scans intentionally deferred if perfusion scan and chest x-ray adequate for interpretation during COVID 19 epidemic. RADIOPHARMACEUTICALS:  1.6 mCi Tc-66m MAA IV COMPARISON:  Radiograph 12/25/2018 FINDINGS: No wedge-shaped peripheral perfusion defects within the lungs to suggest acute pulmonary embolism. IMPRESSION: No evidence acute pulmonary embolism. Electronically Signed   By: Suzy Bouchard M.D.   On: 04/24/2020 11:24     Medications:     Scheduled Medications: . Chlorhexidine Gluconate Cloth  6 each Topical Daily  . enoxaparin (LOVENOX) injection  40 mg Subcutaneous Q24H  . gentamicin irrigation  80 mg Irrigation On Call  . potassium chloride  40 mEq Oral Once  . potassium chloride  40 mEq Oral BID  . sildenafil  60 mg Oral TID  . sodium chloride flush  10-40 mL Intracatheter Q12H  . sodium chloride flush  3 mL Intravenous Q12H  . sodium chloride flush  3 mL Intravenous Q12H  . spironolactone  25 mg Oral Daily  . torsemide  20 mg Oral q1800  . torsemide  40 mg Oral Daily    Infusions: . sodium chloride    . sodium chloride    . sodium chloride 50 mL/hr at 04/25/20 0809  . sodium chloride 50 mL/hr at 04/25/20 0808  .  ceFAZolin (ANCEF) IV    . dextrose 5 % and 0.9% NaCl Stopped (04/18/20 1446)    PRN Medications: sodium chloride, sodium chloride, acetaminophen, ondansetron (ZOFRAN) IV, sodium chloride flush, sodium chloride flush, sodium chloride flush, sodium chloride flush    Assessment/Plan   1. Biventricular HF with severe pulmonary HTN - unifying diagnosis remains unclear. There is no evidence of residual shunt or infiltrative process on cMRI/MRA or RHC - aside from GI symptoms he has had surprisingly few symptoms despite severe PH and very low output - Repeat RHC 5/24 with much improved hemodynamics - Milrinone discontinued 5/24.  - Continue sildenafil 60 tid. Check with TOC  regarding cost.  -Volume status stable. Continue po torsemide 40/20 daily - Continue spiro  - Will need to assess for OSA and need for home O2 support - serologies negative  - VQ today - Dr Haroldine Laws d/w [facility] PAH and congenital teams  -Plan to add macitentan as outpatient. Needs assessment for home O2.   2. CHB - Plan for CRT today.   3. GI symptoms - initially felt to be acute cholecystitis but suspect related to HF/ hepatic congestion - AST/ALT have normalized with treatment of PAH - Off zosyn   4. Renal Insuffiencey - SCr 1.44 peaked - suspect cardiorenal  - Stable today  5. Hypokalemia  - Resolved.     For CRT today    Length of Stay: [place], NP  04/25/2020, 9:27 AM  Advanced Heart Failure Team Pager [phone] (M-F;  7a - 4p)  Please contact [facility] for night-coverage after hours (4p -7a ) and weekends on [email or web address]  Patient seen and examined with the above-signed Advanced Practice Provider and/or Housestaff. I personally reviewed laboratory data, imaging studies and relevant notes. I independently examined the patient and formulated the important aspects of the plan. I have edited the note to reflect any of my changes or salient points. I have personally discussed the plan with the patient and/or family.  Stable from [facility] standpoint today. Breathing well. Volume status looks good on po diuretics.   General:  Well appearing. No resp difficulty HEENT: normal Neck: supple. no JVD. Carotids 2+ bilat; no bruits. No lymphadenopathy or thryomegaly appreciated. Cor: PMI nondisplaced. Huston Foley regular. No rubs, gallops or murmurs. Lungs: clear Abdomen: obese soft, nontender, nondistended. No hepatosplenomegaly. No bruits or masses. Good bowel sounds. Extremities: no cyanosis, clubbing, rash, edema Neuro: alert & orientedx3, cranial nerves grossly intact. moves all 4 extremities w/o difficulty. Affect pleasant  For pacer today and likely home in am. Will need  assessment for home O2 prior to d/c.   Arvilla Meres, MD  10:56 AM       .

## 2020-04-25 NOTE — Interval H&P Note (Signed)
History and Physical Interval Note:  04/25/2020 10:14 AM  Gabriel Floyd  has presented today for surgery, with the diagnosis of heart block.  The various methods of treatment have been discussed with the patient and family. After consideration of risks, benefits and other options for treatment, the patient has consented to  Procedure(s): BIV ICD INSERTION CRT-D (N/A) as a surgical intervention.  The patient's history has been reviewed, patient examined, no change in status, stable for surgery.  I have reviewed the patient's chart and labs.  Questions were answered to the patient's satisfaction.     Sherryl Manges

## 2020-04-25 NOTE — Discharge Summary (Addendum)
Advanced Heart Failure Team  Discharge Summary   Patient ID: Gabriel Floyd MRN: [id number], DOB/AGE: 06/10/1981 39 y.o. Admit date: 04/17/2020 D/C date:     04/26/2020   Primary Discharge Diagnoses:  Acute Biventricular Heart Failure Severe Pulmonary Hypertension  Complete Heart Block  Abdominal pain Acute renal Insufficiency  Hypokalemia  Elevated LFTs, in the setting of RV failure  Hospital Course:  Gabriel Floyd is a 39 y.o. 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 but never had RHC.    Admitted to [facility] 5/21 for acute cholecystitis and found to be in CHB=>>transfered to [facility] for potential PPM. Admitted by EP. Had ECHO and CMRI elevated PA pressures. Referred to HF Team for further evaluation. Had RHC with elevated PA pressures and volume overload Placed on milrinone to support RV + sildenafil .   He had repeat RHC with reduced PVR and optimized for HF. Plans to add macitentan as an outpatient. Milrinone was gradaully weaned off.  Once optimized, EP placed ST Jude dual chamber ICD. On the day of discharge EKG showed A sensed V paced at 62 bpm with QRS 162. He will continue to be followed closely in the HF clinic and has follow up with EP.   See below for detailed problem list. He was given prescription for sildenafil to get [facility] due to cost.     1. Biventricular HF with severe pulmonary HTN - unifying diagnosis remains unclear. There is no evidence of  residual shunt or infiltrative process on cMRI/MRA or RHC - aside from GI symptoms he has had surprisingly few symptoms despite severe PH and very low output - Repeat RHC 5/24 with much improved hemodynamics - Milrinone discontinued 5/24.  - Continue sildenafil 60 tid.  -Diuresed with IV lasix and transitioned to torsemide 40/20 daily.  - Continue spiro  - Will need to assess for OSA- set up home sleep study  - serologies negative  - VQ - negative for PE.  - Dr Gala Romney d/w [facility] PAH and congenital teams   -Plan to add macitentan as outpatient.  - Oxygen saturations stable on room air - Needs at his follow up appointment.   2. CHB -EP admitted with CHB.   - On 04/25/20 . Dual Chamber St Jude ICD placed   3. GI symptoms - initially felt to be acute cholecystitis but suspect related to HF/ hepatic congestion - AST/ALT have normalized with treatment of PAH - Placed on zosyn.    4. Renal Insuffiencey - SCr 1.44 peaked - suspect cardiorenal  - Renal function followed closley  5. Hypokalemia  K replaced as needed.  6. Elevated LFTs, in the setting of RV failure Gradually normalized   Discharge Vitals: Blood pressure 120/83, pulse 63, temperature 97.9 F (36.6 C), temperature source Oral, resp. rate (!) 22, height 5\' 9"  (1.753 m), weight 101.5 kg, SpO2 100 %.   General:  Well appearing. No resp difficulty HEENT: normal Neck: supple. no JVD. Carotids 2+ bilat; no bruits. No lymphadenopathy or thryomegaly  appreciated. Cor: PMI nondisplaced. Regular rate & rhythm. No rubs, gallops or murmurs. Mild swelling over pacer site Lungs: clear Abdomen: obese soft, nontender, nondistended. No hepatosplenomegaly. No bruits or masses. Good bowel sounds. Extremities: no cyanosis, clubbing, rash, edema Neuro: alert & orientedx3, cranial nerves grossly intact. moves all 4 extremities w/o difficulty. Affect pleasant   Labs: Lab Results  Component Value Date   WBC 8.0 04/25/2020   HGB 15.8  04/25/2020   HCT 46.7 04/25/2020   MCV 85.2 04/25/2020   PLT 194 04/25/2020    Recent Labs  Lab 04/23/20 0500 04/23/20 1059 04/26/20 0229  NA 134*   < > 131*  K 3.1*   < > 4.3  CL 91*   < > 97*  CO2 30   < > 25  BUN 29*   < > 29*  CREATININE 1.34*   < > 1.31*  CALCIUM 9.3   < > 9.0  PROT 7.6  --   --   BILITOT 1.2  --   --   ALKPHOS 66  --   --   ALT 40  --   --   AST 29  --   --   GLUCOSE 201*   < > 186*   < > = values in this interval not displayed.   No results found for: CHOL, HDL, LDLCALC, TRIG BNP (last 3 results) Recent Labs    04/18/20 0238  BNP 1,040.2*    ProBNP (last 3 results) No results for input(s): PROBNP in the last 8760 hours.   Diagnostic Studies/Procedures  RHC 04/23/20  On milrinone 0.125 mcg/kg/min and sildenafil 40 tid  RA = 3 RV =69/4 PA = 68/23 (41) PCW = 22 Fick cardiac output/index = 5.1/2.3 PVR = 3.8 WU Ao sat = 99% PA sat = 68%, 70%  Assessment: 1. Much improved hemodynamics 2. Now with moderate mixed PH  RHC on 04/18/20 RA = 15 prominent v-waves RV = 132/17 PA =  132/52 (77) PCW = 31 Fick cardiac output/index = 2.7/1.35 PVR = 17.3 WU FA sat = 99% PA sat = 49%, 51% SVC sat = 54%  Assessment: 1. Very severe mixed pulmonary HTN 2. Severely reduced CO in setting of cor pulmonale  3. No evidence of intracardiac shunting DG Chest 2 View  Result Date: 04/26/2020 CLINICAL DATA:  Status post defibrillator placement EXAM: CHEST - 2 VIEW COMPARISON:  04/24/2019 FINDINGS: Cardiac shadow remains enlarged. New defibrillator is seen. No pneumothorax is noted. Previously noted right-sided PICC line has been removed in the interval. Mild vascular congestion is noted centrally. No edema is seen. No focal infiltrate or effusion is noted. IMPRESSION: No pneumothorax following pacemaker placement. Mild vascular congestion is noted. Electronically Signed   By: Alcide Clever M.D.   On: 04/26/2020 08:30    Discharge Medications   Allergies as  of 04/26/2020   No Known Allergies      Medication List     STOP taking these medications    furosemide 40 MG tablet Commonly known as: LASIX   Potassium Chloride ER 20 MEQ Tbcr       TAKE these medications    loratadine 10 MG tablet Commonly known as: CLARITIN Take 1 tablet by mouth daily.   potassium chloride SA 20 MEQ tablet Commonly known as: KLOR-CON Take 2 tablets (40 mEq total) by mouth daily.   sildenafil 20 MG tablet Commonly known as: REVATIO Take 3 tablets (60 mg total) by mouth 3 (three) times daily.  spironolactone 25 MG tablet Commonly known as: ALDACTONE Take 1 tablet (25 mg total) by mouth daily. Start taking on: Apr 27, 2020   torsemide 20 MG tablet Commonly known as: DEMADEX Take 40 mg in am and 20 mg in pm        Disposition   The patient will be discharged in stable condition to home. Discharge Instructions     (HEART FAILURE PATIENTS) Call MD:  Anytime you have any of the following symptoms: 1) 3 pound weight gain in 24 hours or 5 pounds in 1 week 2) shortness of breath, with or without a dry hacking cough 3) swelling in the hands, feet or stomach 4) if you have to sleep on extra pillows at night in order to breathe.   Complete by: As directed    Diet - low sodium heart healthy   Complete by: As directed    Heart Failure patients record your daily weight using the same scale at the same time of day   Complete by: As directed    Increase activity slowly   Complete by: As directed       Follow-up Information     Diamantina Monks, MD. Go in 4 week(s).   Specialty: Surgery Why: for follow up/surgical planning of recent gallbladder infection. please call to confirm appointment date/time.  Contact information: [address] [place] [address] [phone]         [facility] .   Specialty: Cardiology Contact information: [address] [id number] [place]  [place] [address] [phone]        Duke Salvia, MD Follow up on 07/31/2020.   Specialty: Cardiology Why: at 3 pm for 3 month post device check  Contact information: [address] [place] [address] [phone]         [facility] Follow up on 05/08/2020.   Why: at 12 pm for post hospital wound check Contact information: [address] [place] [address] [phone]             Duration of Discharge Encounter: Greater than 35 minutes   Signed, Tonye Becket NP-C  04/26/2020, 11:17 AM   Patient seen and examined with the above-signed Advanced Practice Provider and/or Housestaff. I personally reviewed laboratory data, imaging studies and relevant notes. I independently examined the patient and formulated the important aspects of the plan. I have edited the note to reflect any of my changes or salient points. I have personally discussed the plan with the patient and/or family.  He looks much better. S/p PPM yesterday. Mild swelling over device. EP to evaluate. AV pacing at 60.   Volume status and PH is stable. Ambulating halls. RN to walk and check ambulatory sats prior to d/c. Continue sildenafil.   General:  Well appearing. No resp difficulty HEENT: normal Neck: supple. no JVD. Carotids 2+ bilat; no bruits. No lymphadenopathy or thryomegaly appreciated. Cor: PMI nondisplaced. Regular rate & rhythm. No rubs, gallops or murmurs. Mild swelling over pacer site Lungs: clear Abdomen: soft, nontender, nondistended. No hepatosplenomegaly. No bruits or masses. Good bowel sounds. Extremities: no cyanosis, clubbing, rash, edema Neuro: alert & orientedx3, cranial nerves grossly intact. moves all 4 extremities w/o difficulty. Affect pleasant   PH much improved. Now s/p PPM for CHB. Ok for d/c. Meds reviewed. D/w patient and family. F/u in clinic 1-2 weeks. Will need outpatient  sleep  study.  Glori Bickers, MD  9:01 AM

## 2020-04-25 NOTE — Progress Notes (Addendum)
Electrophysiology Rounding Note  Patient Name: Gabriel Floyd Date of Encounter: 04/25/2020  Primary Cardiologist: Dr. Gala Romney  Electrophysiologist: Dr. Graciela Husbands   Subjective   The patient is feeling well today. He has no questions about todays procedure.   Inpatient Medications    Scheduled Meds: . Chlorhexidine Gluconate Cloth  6 each Topical Daily  . enoxaparin (LOVENOX) injection  40 mg Subcutaneous Q24H  . gentamicin irrigation  80 mg Irrigation On Call  . potassium chloride  40 mEq Oral Once  . potassium chloride  40 mEq Oral BID  . sildenafil  60 mg Oral TID  . sodium chloride flush  10-40 mL Intracatheter Q12H  . sodium chloride flush  3 mL Intravenous Q12H  . sodium chloride flush  3 mL Intravenous Q12H  . spironolactone  25 mg Oral Daily  . torsemide  20 mg Oral q1800  . torsemide  40 mg Oral Daily   Continuous Infusions: . sodium chloride    . sodium chloride    . sodium chloride 50 mL/hr at 04/25/20 0809  . sodium chloride 50 mL/hr at 04/25/20 0808  .  ceFAZolin (ANCEF) IV    . dextrose 5 % and 0.9% NaCl Stopped (04/18/20 1446)   PRN Meds: sodium chloride, sodium chloride, acetaminophen, ondansetron (ZOFRAN) IV, sodium chloride flush, sodium chloride flush, sodium chloride flush, sodium chloride flush   Vital Signs    Vitals:   04/24/20 1932 04/24/20 2359 04/25/20 0446 04/25/20 0710  BP: 115/73 (!) 93/56 (!) 97/53 (!) 90/46  Pulse: (!) 43 (!) 43 (!) 48 (!) 49  Resp: (!) 24 18 (!) 22 20  Temp: 97.8 F (36.6 C) 98.4 F (36.9 C) 97.8 F (36.6 C) 97.9 F (36.6 C)  TempSrc: Oral Oral Oral Oral  SpO2: 94% 92% 94% 95%  Weight:   101.5 kg   Height:        Intake/Output Summary (Last 24 hours) at 04/25/2020 0832 Last data filed at 04/24/2020 2205 Gross per 24 hour  Intake 1330 ml  Output 900 ml  Net 430 ml   Filed Weights   04/23/20 1200 04/24/20 0336 04/25/20 0446  Weight: 100.3 kg 101.5 kg 101.5 kg    Physical Exam    GEN- The patient is well  appearing, alert and oriented x 3 today.   Head- normocephalic, atraumatic Eyes-  Sclera clear, conjunctiva pink Ears- hearing intact Oropharynx- clear Neck- supple Lungs- Clear to ausculation bilaterally, normal work of breathing Heart- Slow rate and rhythm, no murmurs, rubs or gallops GI- soft, NT, ND, + BS Extremities- no clubbing, cyanosis, or edema Skin- no rash or lesion Psych- euthymic mood, full affect Neuro- strength and sensation are intact  Labs    CBC Recent Labs    04/23/20 1100 04/25/20 0312  WBC  --  8.0  HGB 17.0 15.8  HCT 50.0 46.7  MCV  --  85.2  PLT  --  194   Basic Metabolic Panel Recent Labs    85/27/78 0235 04/25/20 0312  NA 134* 132*  K 3.7 3.9  CL 94* 95*  CO2 29 26  GLUCOSE 176* 186*  BUN 30* 31*  CREATININE 1.27* 1.21  CALCIUM 9.5 9.4   Liver Function Tests Recent Labs    04/23/20 0500  AST 29  ALT 40  ALKPHOS 66  BILITOT 1.2  PROT 7.6  ALBUMIN 3.8   No results for input(s): LIPASE, AMYLASE in the last 72 hours. Cardiac Enzymes No results for input(s): CKTOTAL, CKMB, CKMBINDEX,  TROPONINI in the last 72 hours.   Telemetry    CHB in 40s, hemodynamically stable (personally reviewed)  Radiology    NM Pulmonary Perfusion  Result Date: 04/24/2020 CLINICAL DATA:  Short of breath. Cardiac catheterization 1 day prior. EXAM: NUCLEAR MEDICINE PERFUSION LUNG SCAN TECHNIQUE: Perfusion images were obtained in multiple projections after intravenous injection of radiopharmaceutical. Ventilation scans intentionally deferred if perfusion scan and chest x-ray adequate for interpretation during COVID 19 epidemic. RADIOPHARMACEUTICALS:  1.6 mCi Tc-56m MAA IV COMPARISON:  Radiograph 12/25/2018 FINDINGS: No wedge-shaped peripheral perfusion defects within the lungs to suggest acute pulmonary embolism. IMPRESSION: No evidence acute pulmonary embolism. Electronically Signed   By: Suzy Bouchard M.D.   On: 04/24/2020 11:24   CARDIAC  CATHETERIZATION  Result Date: 04/23/2020 Findings: On milrinone 0.125 mcg/kg/min and sildenafil 40 tid RA = 3 RV =69/4 PA = 68/23 (41) PCW = 22 Fick cardiac output/index = 5.1/2.3 PVR = 3.8 WU Ao sat = 99% PA sat = 68%, 70% Assessment: 1. Much improved hemodynamics 2. Now with moderate mixed PH Plan/Discussion: Continue to titrate meds. Glori Bickers, MD 11:28 AM  DG CHEST PORT 1 VIEW  Result Date: 04/23/2020 CLINICAL DATA:  Shortness of breath EXAM: PORTABLE CHEST 1 VIEW COMPARISON:  04/19/2020 FINDINGS: Cardiomegaly. No frank interstitial edema. Mild bibasilar opacities, likely atelectasis. No pleural effusion or pneumothorax. Right arm PICC terminates at the cavoatrial junction. IMPRESSION: No evidence of acute cardiopulmonary disease. Electronically Signed   By: Julian Hy M.D.   On: 04/23/2020 12:49    Patient Profile     39 y.o. male with PMHx of (in review of Care everywhere)Aortic coarctation, s/p surgical repair 1987 (age 48), with residual ascending/descending thoracic aortic dilation on imaging 10/2017, and moderate AI,Ventricular septal defect (small by notes) and subaortic membrane, also repaired in 1987,HFpEF, with hypertrophic cardiomyopathy, denies history of hypertension,Diabetes versus pre-diabetes, and diastolic CHF, ?p.HTN  Echo>> LV dysfunction-moderate             RV pressures elevated  RHC  PApressuer 165             Resistance 17 Woods units             RA pressure> 15  cMRI  RVE and RV dysfunction             LVE-severe,( 65/73mm)  EF 38%             LGE neg x at site of VSD repair  Assessment & Plan    1. Complete Heart Block Hemodynamically stable Plan CRT-D insertion today in setting of cardiomyopathy  2. Biventricular CHF with severe pulmonary HTN, EF 40% No evidence of residual shunt from congential disease or infiltrative process on cMRI/MRA or RHC On Sildenafil 60 mg TID Will need sleep study Dr. Haroldine Laws has discussed with [facility]  and congenital teams  3. Renal insufficiency Cardiorenal component. This has improved.   4. Hypokalemia  K 3.9 today  5. Cholecystitis/ GI symptoms Symptoms resolved. ? If related to HF.  AST/ALT have normalized with PAH treatment Finished Zosyn.  For questions or updates, please contact [place] Please consult [email or web address] for contact info under Cardiology/STEMI.  Signed, Shirley Friar, PA-C  04/25/2020, 8:32 AM  Pt not seen

## 2020-04-25 NOTE — TOC Progression Note (Signed)
Transition of Care [facility]) - Progression Note   Patient Details  Name: Gabriel Floyd MRN: [id number] Date of Birth: 19-Jan-1981  [facility] CM/SW Contact  Leone Haven, RN Phone Number: 04/25/2020, 3:17 PM  Clinical Narrative:    Patient from home, with acute cholecystitis and CHB, for pacemaker today, plan home , TOC will continue to follow for dc needs.        Expected Discharge Plan and Services                                                 Social Determinants of Health (SDOH) Interventions    Readmission Risk Interventions No flowsheet data found.

## 2020-04-26 ENCOUNTER — Inpatient Hospital Stay (HOSPITAL_COMMUNITY): Payer: Self-pay

## 2020-04-26 LAB — BASIC METABOLIC PANEL
Anion gap: 9 (ref 5–15)
BUN: 29 mg/dL — ABNORMAL HIGH (ref 6–20)
CO2: 25 mmol/L (ref 22–32)
Calcium: 9 mg/dL (ref 8.9–10.3)
Chloride: 97 mmol/L — ABNORMAL LOW (ref 98–111)
Creatinine, Ser: 1.31 mg/dL — ABNORMAL HIGH (ref 0.61–1.24)
GFR calc Af Amer: >60 mL/min (ref >60)
GFR calc non Af Amer: >60 mL/min (ref >60)
Glucose, Bld: 186 mg/dL — ABNORMAL HIGH (ref 70–99)
Potassium: 4.3 mmol/L (ref 3.5–5.1)
Sodium: 131 mmol/L — ABNORMAL LOW (ref 135–145)

## 2020-04-26 MED ORDER — TORSEMIDE 20 MG PO TABS: ORAL_TABLET | ORAL | 6 refills | Status: DC

## 2020-04-26 MED ORDER — SILDENAFIL CITRATE 20 MG PO TABS: 60.0000 mg | ORAL_TABLET | Freq: Three times a day (TID) | ORAL | 6 refills | Status: DC

## 2020-04-26 MED ORDER — SPIRONOLACTONE 25 MG PO TABS: 25.0000 mg | ORAL_TABLET | Freq: Every day | ORAL | 6 refills | Status: DC

## 2020-04-26 MED ORDER — POTASSIUM CHLORIDE CRYS ER 20 MEQ PO TBCR: 40.0000 meq | EXTENDED_RELEASE_TABLET | Freq: Every day | ORAL | 6 refills | Status: DC

## 2020-04-26 MED FILL — Lidocaine HCl Local Inj 1%: INTRAMUSCULAR | Qty: 60 | Status: AC

## 2020-04-26 MED FILL — POTASSIUM CHLORIDE 20meqER: 20 | 30 days supply | Qty: 60 | Fill #0

## 2020-04-26 MED FILL — TORSEMIDE 20 MG TABLET: 20 | 30 days supply | Qty: 90 | Fill #0

## 2020-04-26 MED FILL — SPIRONOLACTONE 25 MG TABLET: 25 | 30 days supply | Qty: 30 | Fill #0

## 2020-04-26 NOTE — Discharge Instructions (Signed)
Despus del implante de su desfibrilador   Tienes un marcapasos de [place]. Jude    No levante el brazo por encima de la altura de los hombros durante 1 semana despus de su procedimiento. Despus de [address] puede progresar como se indica a continuacin.    Thursday May 03, 2020  Friday May 04, 2020 Saturday May 05, 2020 Sunday May 06, 2020    No levante ms de 10 libras con el brazo afectado hasta 6 semanas despus del procedimiento. No hay otras restricciones en cuanto al movimiento del brazo despus de su cita de revisin de la herida.    Monitoree el sitio de su marcapasos para ver si tiene enrojecimiento, hinchazn y drenaje. Llame a la [employer] colocaron el dispositivo al [phone] si tiene los sntomas anteriores, o fiebre/resfriado.    Si su herida se cierra con tiritas esterilizadas (Steri-strips) o con grapas: Puede ducharse 7 das despus del procedimiento y lavar su herida con agua y [place]. Si su herida se cierra con Dermabond: Puede ducharse un da despus del implante del marcapasos y lavar su herida con agua y [place]. Evite el uso de lociones, ungentos o perfumes sobre su herida hasta que est completamente sanada.    Si la herida est completamente cerrada, puede usar un jacuzzi o una piscina despus de su cita de revisin de la herida.    Puede conducir, a menos que sus proveedores de atencin mdica le hayan dicho que no puede conducir.    Es posible su marcapasos es compatible con Lobbyist de IRM (Imagen por Resonancia Magntica). Pregunte en su prxima cita.   Su DCI est diseado para protegerle de ritmos cardacos que amenazan con su vida. Debido a esto, es posible que reciba Customer service manager.    o 1 descarga sin sntomas: Llame a la oficina durante las horas de oficina.  o 1 descarga con sntomas (dolor en el pecho, presin en el pecho, mareos, vrtigos, dificultad para respirar, sensacin general de malestar): Llame al 911.  o Si experimenta 2 o  ms descargas en 24 horas: Llame al 911  o Si recibe una descarga, no debe conducir.  o [place] DMV: no puede conducir durante 6 meses si recibe la terapia adecuada de su DCI.    Alertas del DCI: algunas alertas son vibratorias y Clinical biochemist. Estas NO son emergencias. Por favor llame a nuestra oficina para informarnos. Si esto ocurre de noche o [employer] fines de [place], puede esperar hasta el siguiente da de [place]. Enve una transmisin remota.    Si su dispositivo puede leer el estado de los lquidos (por la insuficiencia cardaca), se le ofrecer un seguimiento mensual para repasarlo con usted.   La vigilancia remota se [place] para monitorear el [address] desde su casa. Esta vigilancia se programa cada [address] en nuestra oficina. Esto nos permite vigilar el funcionamiento de su dispositivo para asegurarnos de que funciona correctamente. Usted ver a su electrofisilogo anualmente (si es necesario, le ver ms a menudo).   After Your ICD (Implantable Cardiac Defibrillator)    You have a St. Jude ICD  ACTIVITY  Do not lift your arm above shoulder height for 1 week after your procedure. After 7 days, you may progress as below.     Thursday May 03, 2020  Friday May 04, 2020 Saturday May 05, 2020 Sunday May 06, 2020    Do not lift, push, pull, or carry anything over 10 pounds with the affected arm until 6  weeks (Thursday June 07, 2020 ) after your procedure.    Do NOT DRIVE until you have been seen for your wound check, or as long as instructed by your healthcare provider.    Ask your healthcare provider when you can go back to work   INCISION/Dressing  If you are on a blood thinner such as Coumadin, Xarelto, Eliquis, Plavix, or Pradaxa please confirm with your provider when this should be resumed.    Monitor your defibrillator site for redness, swelling, and drainage. Call the device clinic at [phone] if you experience these symptoms or fever/chills.   If your  incision is closed with Dermabond/Surgical glue. You may shower 1 day after your pacemaker implant and wash around the site with soap and water.     Avoid lotions, ointments, or perfumes over your incision until it is well-healed.   You may use a hot tub or a pool AFTER your wound check appointment if the incision is completely closed.   Your ICD is designed to protect you from life threatening heart rhythms. Because of this, you may receive a shock.   o 1 shock with no symptoms:  Call the office during business hours. o 1 shock with symptoms (chest pain, chest pressure, dizziness, lightheadedness, shortness of breath, overall feeling unwell):  Call 911. o If you experience 2 or more shocks in 24 hours:  Call 911. o If you receive a shock, you should not drive for 6 months per the [place] DMV IF you receive appropriate therapy from your ICD.    ICD Alerts:  Some alerts are vibratory and others beep. These are NOT emergencies. Please call our office to let us know. If this occurs at night or on weekends, it can wait until the next business day. Send a remote transmission.   If your device is capable of reading fluid status (for heart failure), you will be offered monthly monitoring to review this with you.   DEVICE MANAGEMENT  Remote monitoring is used to monitor your ICD from home. This monitoring is scheduled every 91 days by our office. It allows [place] to keep an eye on the functioning of your device to ensure it is working properly. You will routinely see your Electrophysiologist annually (more often if necessary).    You should receive your ID card for your new device in 4-8 weeks. Keep this card with you at all times once received. Consider wearing a medical alert bracelet or necklace.   Your ICD  may be MRI compatible. This will be discussed at your next office visit/wound check.  You should avoid contact with strong electric or magnetic fields.    Do not use amateur (ham) radio  equipment or electric (arc) welding torches. MP3 player headphones with magnets should not be used. Some devices are safe to use if held at least 12 inches (30 cm) from your defibrillator. These include power tools, lawn mowers, and speakers. If you are unsure if something is safe to use, ask your health care provider.   When using your cell phone, hold it to the ear that is on the opposite side from the defibrillator. Do not leave your cell phone in a pocket over the defibrillator.   You may safely use electric blankets, heating pads, computers, and microwave ovens.  Call the office right away if:  You have chest pain.  You feel more than one shock.  You feel more short of breath than you have felt before.  You feel more  light-headed than you have felt before.  Your incision starts to open up.  This information is not intended to replace advice given to you by your health care provider. Make sure you discuss any questions you have with your health care provider.

## 2020-04-26 NOTE — Progress Notes (Signed)
SATURATION QUALIFICATIONS:   Patient Saturations on Room Air at Rest  97%  Patient Saturations on [employer] while Ambulating = 95%  Patient Saturations on N/A Liters of oxygen while Ambulating = N/A  Please briefly explain why patient needs home oxygen: N/A

## 2020-04-26 NOTE — Plan of Care (Signed)
  Problem: Education: Goal: Ability to demonstrate management of disease process will improve Outcome: Progressing   Problem: Activity: Goal: Capacity to carry out activities will improve Outcome: Progressing   Problem: Education: Goal: Knowledge of General Education information will improve Description: Including pain rating scale, medication(s)/side effects and non-pharmacologic comfort measures Outcome: Progressing   Problem: Health Behavior/Discharge Planning: Goal: Ability to manage health-related needs will improve Outcome: Progressing   Problem: Clinical Measurements: Goal: Will remain free from infection Outcome: Progressing Goal: Diagnostic test results will improve Outcome: Progressing Goal: Respiratory complications will improve Outcome: Progressing   Problem: Nutrition: Goal: Adequate nutrition will be maintained Outcome: Progressing

## 2020-04-26 NOTE — Progress Notes (Addendum)
Electrophysiology Rounding Note  Patient Name: Gabriel Floyd Date of Encounter: 04/26/2020  Primary Cardiologist: No primary care provider on file. Electrophysiologist: Dr. Graciela Husbands   Subjective   S/p St Jude CRT-D 04/25/2020  The patient is feeling well today. He is slightly sore but otherwise feeling well. Questions answered about wound care and arm restriction, and provided in spanish and english per pt request.   Inpatient Medications    Scheduled Meds: . Chlorhexidine Gluconate Cloth  6 each Topical Daily  . enoxaparin (LOVENOX) injection  40 mg Subcutaneous Q24H  . potassium chloride  40 mEq Oral BID  . sildenafil  60 mg Oral TID  . sodium chloride flush  10-40 mL Intracatheter Q12H  . sodium chloride flush  3 mL Intravenous Q12H  . sodium chloride flush  3 mL Intravenous Q12H  . spironolactone  25 mg Oral Daily  . torsemide  20 mg Oral q1800  . torsemide  40 mg Oral Daily   Continuous Infusions: . sodium chloride    . sodium chloride    . dextrose 5 % and 0.9% NaCl Stopped (04/18/20 1446)   PRN Meds: sodium chloride, sodium chloride, acetaminophen, acetaminophen, ondansetron (ZOFRAN) IV, ondansetron (ZOFRAN) IV, sodium chloride flush, sodium chloride flush, sodium chloride flush, sodium chloride flush   Vital Signs    Vitals:   04/26/20 0011 04/26/20 0316 04/26/20 0535 04/26/20 0758  BP: 94/66 106/75  120/83  Pulse: 62 (!) 58  63  Resp: 18 16  (!) 22  Temp: 97.9 F (36.6 C) 98.2 F (36.8 C)  97.9 F (36.6 C)  TempSrc: Oral Oral  Oral  SpO2: 96% 95%  100%  Weight:   101.5 kg   Height:        Intake/Output Summary (Last 24 hours) at 04/26/2020 1114 Last data filed at 04/26/2020 0600 Gross per 24 hour  Intake 1368.44 ml  Output 2200 ml  Net -831.56 ml   Filed Weights   04/24/20 0336 04/25/20 0446 04/26/20 0535  Weight: 101.5 kg 101.5 kg 101.5 kg    Physical Exam    GEN- The patient is well appearing, alert and oriented x 3 today.   Head-  normocephalic, atraumatic Eyes-  Sclera clear, conjunctiva pink Ears- hearing intact Oropharynx- clear Neck- supple Lungs- Clear to ausculation bilaterally, normal work of breathing Heart- Regular rate and rhythm, no murmurs, rubs or gallops GI- soft, NT, ND, + BS Extremities- no clubbing, cyanosis, or edema Skin- no rash or lesion Psych- euthymic mood, full affect Neuro- strength and sensation are intact  Labs    CBC Recent Labs    04/25/20 0312  WBC 8.0  HGB 15.8  HCT 46.7  MCV 85.2  PLT 194   Basic Metabolic Panel Recent Labs    09/73/53 0312 04/26/20 0229  NA 132* 131*  K 3.9 4.3  CL 95* 97*  CO2 26 25  GLUCOSE 186* 186*  BUN 31* 29*  CREATININE 1.21 1.31*  CALCIUM 9.4 9.0   Liver Function Tests No results for input(s): AST, ALT, ALKPHOS, BILITOT, PROT, ALBUMIN in the last 72 hours. No results for input(s): LIPASE, AMYLASE in the last 72 hours. Cardiac Enzymes No results for input(s): CKTOTAL, CKMB, CKMBINDEX, TROPONINI in the last 72 hours.   Telemetry    AS-VPaced (personally reviewed)  Radiology    DG Chest 2 View  Result Date: 04/26/2020 CLINICAL DATA:  Status post defibrillator placement EXAM: CHEST - 2 VIEW COMPARISON:  04/24/2019 FINDINGS: Cardiac shadow remains enlarged. New  defibrillator is seen. No pneumothorax is noted. Previously noted right-sided PICC line has been removed in the interval. Mild vascular congestion is noted centrally. No edema is seen. No focal infiltrate or effusion is noted. IMPRESSION: No pneumothorax following pacemaker placement. Mild vascular congestion is noted. Electronically Signed   By: Inez Catalina M.D.   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 Complete Heart Block Now s/p St Jude CRT device 04/25/2020 CXR 04/26/20 stable without pnuemothorax EKG shows As V-paced at 62 bpm with QRS 162, with effective CRT.  PR interval 202 ms, SAV programmed to 160. This is due to atrial latency per [facility], and stable to follow per Dr. Caryl Comes.   2. Biventricular CHF with severe pulmonary HTN, EF 40% No evidence of residual shunt from congenital disease or infiltrative process on cMRI/MRA or RHC.  Continue sildenafil 60 mg TID Has close follow up with AHF clinic scheduled.   3. Renal insufficiency Cardiorenal component. This has improved.   4. Hypokalemia  K 4.3 today.   5. Cholecystitis/ GI symptoms Symptoms resolved with treatment of HF/PAH.   Reviewed wound care and mobility restrictions with patient and family. Instructions provided in [place] per patient request so his sister can help him read over them.   OK for discharge with usual device follow up from EP perspective.   For questions or updates, please contact [place] Please consult [email or web address] for contact info under Cardiology/STEMI.  Signed, Shirley Friar, PA-C  04/26/2020, 11:14 AM

## 2020-04-26 NOTE — TOC Progression Note (Addendum)
Transition of Care [facility]) - Progression Note    Patient Details  Name: Gabriel Floyd MRN: [id number] Date of Birth: 06/01/1981  Transition of Care [facility]) CM/SW Contact  Leone Haven, RN Phone Number: 04/26/2020, 2:53 PM  Clinical Narrative:    Patient is for dc today, NCM received call from [facility] pharmacy for assistance with Match for patient ,he does not have any insurance.  NCM assisted with Match.  NCM informed Staff RN to inform [facility] in to make follow up apt.         Expected Discharge Plan and Services           Expected Discharge Date: 04/26/20                                     Social Determinants of Health (SDOH) Interventions    Readmission Risk Interventions No flowsheet data found.

## 2020-05-08 ENCOUNTER — Encounter (HOSPITAL_COMMUNITY): Payer: Self-pay

## 2020-05-08 ENCOUNTER — Ambulatory Visit (INDEPENDENT_AMBULATORY_CARE_PROVIDER_SITE_OTHER): Payer: Self-pay | Admitting: Emergency Medicine

## 2020-05-08 ENCOUNTER — Other Ambulatory Visit: Payer: Self-pay

## 2020-05-08 DIAGNOSIS — I442 Atrioventricular block, complete: Secondary | ICD-10-CM

## 2020-05-08 DIAGNOSIS — I422 Other hypertrophic cardiomyopathy: Secondary | ICD-10-CM

## 2020-05-08 DIAGNOSIS — I429 Cardiomyopathy, unspecified: Secondary | ICD-10-CM

## 2020-05-08 LAB — CUP PACEART INCLINIC DEVICE CHECK
Battery Remaining Longevity: 57 mo
Brady Statistic RA Percent Paced: 45 %
Brady Statistic RV Percent Paced: 99.8 %
Date Time Interrogation Session: 20210608121600
HighPow Impedance: 76.5 Ohm
Implantable Lead Implant Date: 20210526
Implantable Lead Implant Date: 20210526
Implantable Lead Implant Date: 20210526
Implantable Lead Location: 753858
Implantable Lead Location: 753859
Implantable Lead Location: 753860
Implantable Pulse Generator Implant Date: 20210526
Lead Channel Impedance Value: 425 Ohm
Lead Channel Impedance Value: 450 Ohm
Lead Channel Impedance Value: 587.5 Ohm
Lead Channel Pacing Threshold Amplitude: 0.625 V
Lead Channel Pacing Threshold Amplitude: 0.75 V
Lead Channel Pacing Threshold Amplitude: 1.75 V
Lead Channel Pacing Threshold Pulse Width: 0.5 ms
Lead Channel Pacing Threshold Pulse Width: 0.5 ms
Lead Channel Pacing Threshold Pulse Width: 0.6 ms
Lead Channel Sensing Intrinsic Amplitude: 12 mV
Lead Channel Sensing Intrinsic Amplitude: 5 mV
Lead Channel Setting Pacing Amplitude: 1.625
Lead Channel Setting Pacing Amplitude: 3.5 V
Lead Channel Setting Pacing Amplitude: 3.5 V
Lead Channel Setting Pacing Pulse Width: 0.5 ms
Lead Channel Setting Pacing Pulse Width: 0.6 ms
Lead Channel Setting Sensing Sensitivity: 0.5 mV
Pulse Gen Serial Number: 111023761

## 2020-05-08 NOTE — Progress Notes (Addendum)
Wound check appointment. Niece , Byrd Hesselbach , served as interpreter for appointment. Release signed by patient opting to use family as interpretor.Steri-strips removed. Wound without redness or edema. Incision edges approximated, wound well healed. Normal device function. Thresholds, sensing, and impedances consistent with implant measurements. Device programmed at 3.5V for extra safety margin until 3 month visit. Histogram distribution appropriate for patient and level of activity. No mode switches or ventricular arrhythmias noted. Patient educated about wound care, arm mobility, lifting restrictions, shock plan. ROV with Dr Graciela Husbands on 07/31/20. next remote transmission scheduled for 07/27/20.

## 2020-05-23 NOTE — Progress Notes (Signed)
ICD Criteria  Current LVEF:40%. Within 12 months prior to implant: Yes   Heart failure history: Yes, Class III  Cardiomyopathy history: Yes, Non-Ischemic Cardiomyopathy.  Atrial Fibrillation/Atrial Flutter: No.  Ventricular tachycardia history: No.  Cardiac arrest history: No.  History of syndromes with risk of sudden death: Yes, Other  Previous ICD: No.  Current ICD indication: Primary  PPM indication: Yes. Pacing type: Ventricular. Greater than 40% RV pacing requirement anticipated. Indication: Complete Heart Block  Class I or II Bradycardia indication present: No  Beta Blocker therapy for 3 or more months: Yes, prescribed.   Ace Inhibitor/ARB therapy for 3 or more months: Yes, prescribed.    I have seen Gabriel Floyd is a 39 y.o. malepre-procedural and has been seen for consideration of ICD implant for primary prevention of sudden death.  The patient's chart has been reviewed and they meet criteria for ICD implant.  I have had a thorough discussion with the patient reviewing options.  The patient and their family (if available) have had opportunities to ask questions and have them answered. The patient and I have decided together through the [facility] Heart Care Share Decision Support Tool to implant ICD at this time.  Risks, benefits, alternatives to ICD implantation were discussed in detail with the patient today. The patient  understands that the risks include but are not limited to bleeding, infection, pneumothorax, perforation, tamponade, vascular damage, renal failure, MI, stroke, death, inappropriate shocks, and lead dislodgement and   wishes to proceed.

## 2020-06-11 ENCOUNTER — Telehealth: Payer: Self-pay | Admitting: Emergency Medicine

## 2020-06-11 NOTE — Telephone Encounter (Signed)
Interpreter services used to contact the patient. Interpreter [phone], Myrene Buddy was on the call. Patient reported that he did not feel any different during episode of AT/Af that started 06/06/20 @ 0548 and lasted for 1 day, 6 hours, 8 min. 18 seconds. Patient reported he feels like he is "17" again after the CRT-D implant. He did have questions concerning sexual activity and was informed that it is ok for him to engage in sexual activity. Patient asked if he could resume eating "Latin food" and was educated on limiting salty foods and fat in his diet. Confirmed patient's 07/31/20 appointment at 1500 with Dr Graciela Husbands. Patient requested office address be e-mailed to him at [email or web address].

## 2020-06-20 NOTE — Telephone Encounter (Signed)
Noted  

## 2020-07-30 NOTE — Progress Notes (Signed)
Patient Care Team: Patient, [name] as PCP - General (General Practice)   HPI  Gabriel Floyd is a 39 y.o. male Seen in followup for CRT-D implantation CHB in the setting of remote cardiac surgery for repair of subaortic membrane,  Possible hypertrophic Cardiomyopathy with significant impairment of LV function (<50%) and severe pulmonary hypertension.    Hospitalized 5//21 for the above, INput from CHF-DB for pulm HTN and treated on milrinone, with PAP 132>>69 and started on sildenafil   DATE TEST EF   5/21 cMRI 38 % Severe LVE LGE neg  5/21* Echo  45 % LVH 35mm        Date Cr K Hgb  5/21 1.31 4.3 15.8         Has been doing very well with less SOB and no edema  Starting to work  Records and Results Reviewed   Past Medical History:  Diagnosis Date  . Heart failure (HCC)   . Pulmonary hyperinflation     Past Surgical History:  Procedure Laterality Date  . BIV ICD INSERTION CRT-D N/A 04/25/2020   Procedure: BIV ICD INSERTION CRT-D;  Surgeon: Duke Salvia, MD;  Location: [facility] INVASIVE CV LAB;  Service: Cardiovascular;  Laterality: N/A;  . COARCTATION OF AORTA REPAIR    . RIGHT HEART CATH N/A 04/18/2020   Procedure: RIGHT HEART CATH;  Surgeon: Dolores Patty, MD;  Location: [facility] INVASIVE CV LAB;  Service: Cardiovascular;  Laterality: N/A;  . RIGHT HEART CATH N/A 04/23/2020   Procedure: RIGHT HEART CATH;  Surgeon: Dolores Patty, MD;  Location: [facility] INVASIVE CV LAB;  Service: Cardiovascular;  Laterality: N/A;    Current Meds  Medication Sig  . loratadine (CLARITIN) 10 MG tablet Take 1 tablet by mouth daily.  . metFORMIN (GLUCOPHAGE) 500 MG tablet Take 500 mg by mouth 2 (two) times daily.  . potassium chloride SA (KLOR-CON) 20 MEQ tablet Take 2 tablets (40 mEq total) by mouth daily.  . sildenafil (REVATIO) 20 MG tablet Take 3 tablets (60 mg total) by mouth 3 (three) times daily.  Marland Kitchen spironolactone (ALDACTONE) 25 MG tablet Take 1 tablet (25 mg total) by mouth  daily.  Marland Kitchen torsemide (DEMADEX) 20 MG tablet Take 40 mg in am and 20 mg in pm    No Known Allergies    Review of Systems negative except from HPI and PMH  Physical Exam BP 106/78   Pulse 78   Ht 5\' 9"  (1.753 m)   Wt 230 lb (104.3 kg)   SpO2 95%   BMI 33.97 kg/m  Well developed and well nourished in no acute distress HENT normal E scleral and icterus clear Neck Supple JVP flat; carotids brisk and full Clear to ausculation  Regular rate and rhythm, no murmurs gallops or rub Soft with active bowel sounds No clubbing cyanosis  Edema Alert and oriented, grossly normal motor and sensory function Skin Warm and Dry  ECG sinus with P synchronous pacing at 78 Interval 16/17/49 QRS morphology negative lead I and QR S in lead V1  CrCl cannot be calculated (Patient's most recent lab result is older than the maximum 21 days allowed.).   Assessment and  Plan  Cardiomyopathy  Complete heart block   Yes Pulmonary hypertension  Obesity  HFpEF  The patient is functionally much improved.  I take this with a little bit of salt as when he was in the hospital he also tended to deny symptoms.  His mother however  does say he is doing pretty well.  He is working some.  I told him he can continue.  He has not lifting weights and I said anything he can lift 20 times should be okay as this would be aerobic work and not in aerobic work.  We will plan to repeat his echocardiogram to assess pulmonary pressures.  He is having problems with medications.  I have reached out to social work to try to see how they might be able to support him.  He is in need of a sleep study.       Current medicines are reviewed at length with the patient today .  The patient does not  have concerns regarding medicines.

## 2020-07-31 ENCOUNTER — Other Ambulatory Visit: Payer: Self-pay

## 2020-07-31 ENCOUNTER — Encounter: Payer: Self-pay | Admitting: Internal Medicine

## 2020-07-31 ENCOUNTER — Telehealth: Payer: Self-pay | Admitting: Licensed Clinical Social Worker

## 2020-07-31 ENCOUNTER — Ambulatory Visit (INDEPENDENT_AMBULATORY_CARE_PROVIDER_SITE_OTHER): Payer: Self-pay | Admitting: Internal Medicine

## 2020-07-31 VITALS — BP 106/78 | HR 78 | Ht 69.0 in | Wt 230.0 lb

## 2020-07-31 DIAGNOSIS — I272 Pulmonary hypertension, unspecified: Secondary | ICD-10-CM

## 2020-07-31 DIAGNOSIS — Z79899 Other long term (current) drug therapy: Secondary | ICD-10-CM

## 2020-07-31 DIAGNOSIS — R0683 Snoring: Secondary | ICD-10-CM

## 2020-07-31 DIAGNOSIS — I429 Cardiomyopathy, unspecified: Secondary | ICD-10-CM

## 2020-07-31 DIAGNOSIS — R400 Somnolence: Secondary | ICD-10-CM

## 2020-07-31 NOTE — Patient Instructions (Addendum)
Medication Instructions:  Your physician recommends that you continue on your current medications as directed. Please refer to the Current Medication list given to you today.  *If you need a refill on your cardiac medications before your next appointment, please call your pharmacy*   Lab Work: Today: BMET If you have labs (blood work) drawn today and your tests are completely normal, you will receive your results only by: Marland Kitchen MyChart Message (if you have MyChart) OR . A paper copy in the mail If you have any lab test that is abnormal or we need to change your treatment, we will call you to review the results.   Testing/Procedures: Your physician has requested that you have an echocardiogram. Echocardiography is a painless test that uses sound waves to create images of your heart. It provides your doctor with information about the size and shape of your heart and how well your heart's chambers and valves are working. This procedure takes approximately one hour. There are no restrictions for this procedure.  Your physician has recommended that you have a sleep study. This test records several body functions during sleep, including: brain activity, eye movement, oxygen and carbon dioxide blood levels, heart rate and rhythm, breathing rate and rhythm, the flow of air through your mouth and nose, snoring, body muscle movements, and chest and belly movement.   Follow-Up: Remote monitoring is used to monitor your Pacemaker of ICD from home. This monitoring reduces the number of office visits required to check your device to one time per year. It allows [place] to keep an eye on the functioning of your device to ensure it is working properly. You are scheduled for a device check from home on 10/26/2020. You may send your transmission at any time that day. If you have a wireless device, the transmission will be sent automatically. After your physician reviews your transmission, you will receive a postcard with  your next transmission date.  At [facility], you and your health needs are our priority.  As part of our continuing mission to provide you with exceptional heart care, we have created designated Provider Care Teams.  These Care Teams include your primary Cardiologist (physician) and Advanced Practice Providers (APPs -  Physician Assistants and Nurse Practitioners) who all work together to provide you with the care you need, when you need it.  We recommend signing up for the patient portal called "MyChart".  Sign up information is provided on this After Visit Summary.  MyChart is used to connect with patients for Virtual Visits (Telemedicine).  Patients are able to view lab/test results, encounter notes, upcoming appointments, etc.  Non-urgent messages can be sent to your provider as well.   To learn more about what you can do with MyChart, go to [email or web address].    Your next appointment:   9 month(s)  The format for your next appointment:   In Person  Provider:   Sherryl Manges, MD   Thank you for choosing [facility]!!     Other Instructions .sl

## 2020-07-31 NOTE — Telephone Encounter (Signed)
CSW received referral to assist patient with resources for medications and insurance. CSW contacted patient who requested CSW to call his sister. CSW unable to reach sister as line was busy. CSW will attempt again. Lasandra Beech, LCSW, CCSW-MCS [phone]

## 2020-08-01 ENCOUNTER — Telehealth: Payer: Self-pay | Admitting: *Deleted

## 2020-08-01 LAB — BASIC METABOLIC PANEL
BUN/Creatinine Ratio: 24 — ABNORMAL HIGH (ref 9–20)
BUN: 32 mg/dL — ABNORMAL HIGH (ref 6–20)
CO2: 19 mmol/L — ABNORMAL LOW (ref 20–29)
Calcium: 9.8 mg/dL (ref 8.7–10.2)
Chloride: 88 mmol/L — ABNORMAL LOW (ref 96–106)
Creatinine, Ser: 1.35 mg/dL — ABNORMAL HIGH (ref 0.76–1.27)
GFR calc Af Amer: 77 mL/min/{1.73_m2} (ref >59)
GFR calc non Af Amer: 67 mL/min/{1.73_m2} (ref >59)
Glucose: 736 mg/dL (ref 65–99)
Potassium: 4.8 mmol/L (ref 3.5–5.2)
Sodium: 127 mmol/L — ABNORMAL LOW (ref 134–144)

## 2020-08-01 NOTE — Telephone Encounter (Signed)
Lab results reviewed with Dr Graciela Husbands who recommends patient contact PCP regarding elevated glucose and low sodium.  Patient contacted through interpreter services.  Interpreter (Maria-interpreter # [phone]) spoke with patient and reviewed results with him and advised him to contact PCP. Patient reports he spoke with Dr Graciela Husbands about a treatment he saw on TV for his heart.He is asking how to get this information to Dr Graciela Husbands. He does not know name.  He will bring information to upcoming echo appointment so it can be sent to Dr Graciela Husbands for review.

## 2020-08-02 ENCOUNTER — Telehealth: Payer: Self-pay | Admitting: Licensed Clinical Social Worker

## 2020-08-02 NOTE — Telephone Encounter (Signed)
CSW contacted patient's sister per his request to follow up on recent office visit. CSW contacted and message left for return call. Lasandra Beech, LCSW, CCSW-MCS [phone]

## 2020-08-15 ENCOUNTER — Encounter (HOSPITAL_COMMUNITY): Payer: Self-pay

## 2020-08-17 ENCOUNTER — Ambulatory Visit (HOSPITAL_COMMUNITY): Payer: Self-pay | Attending: Internal Medicine

## 2020-08-17 ENCOUNTER — Telehealth: Payer: Self-pay | Admitting: Internal Medicine

## 2020-08-17 ENCOUNTER — Other Ambulatory Visit: Payer: Self-pay

## 2020-08-17 DIAGNOSIS — I272 Pulmonary hypertension, unspecified: Secondary | ICD-10-CM

## 2020-08-17 LAB — ECHOCARDIOGRAM COMPLETE
AR max vel: 1.48 cm2
AV Area VTI: 1.68 cm2
AV Area mean vel: 1.53 cm2
AV Mean grad: 13 mmHg
AV Peak grad: 20.1 mmHg
Ao pk vel: 2.24 m/s
Area-P 1/2: 4.39 cm2
P 1/2 time: 350 msec
S' Lateral: 4.9 cm

## 2020-08-17 MED ORDER — PERFLUTREN LIPID MICROSPHERE
1.0000 mL | INTRAVENOUS | Status: AC | PRN
  Administered 2020-08-17: 2 mL via INTRAVENOUS

## 2020-08-17 NOTE — Telephone Encounter (Signed)
Do not recommend this product due to patient's uncontrolled diabetes

## 2020-08-17 NOTE — Telephone Encounter (Signed)
Attempted phone call pt.  According to pt's chart ok to leave detailed message.  Pt advised supplement is not recommended d/t it containing sugar and pt has uncontrolled diabetes.  Pt advised to contact RN at [phone] for further questions.

## 2020-08-17 NOTE — Telephone Encounter (Signed)
Pt. Would like to know if he can take Aqtua . It is a ubiquinol and magnesium and powder supplement. He left a sample at the front desk for Dr. Graciela Husbands.

## 2020-09-04 ENCOUNTER — Telehealth: Payer: Self-pay

## 2020-09-04 NOTE — Telephone Encounter (Signed)
-----   Message from Duke Salvia, MD sent at 08/28/2020 10:09 PM EDT ----- Please Inform Patient Gabriel Floyd showed   stable#   heart muscle function at about 35%   Continue current meds

## 2020-09-04 NOTE — Telephone Encounter (Signed)
Spoke with pt and advised per Dr Graciela Husbands pt's echo shows stable heart muscle function.

## 2020-09-07 ENCOUNTER — Ambulatory Visit (INDEPENDENT_AMBULATORY_CARE_PROVIDER_SITE_OTHER): Payer: Self-pay

## 2020-09-07 DIAGNOSIS — I442 Atrioventricular block, complete: Secondary | ICD-10-CM

## 2020-09-08 LAB — CUP PACEART REMOTE DEVICE CHECK
Battery Remaining Longevity: 70 mo
Battery Remaining Percentage: 91 %
Battery Voltage: 2.96 V
Brady Statistic AP VP Percent: 18 %
Brady Statistic AP VS Percent: 1 %
Brady Statistic AS VP Percent: 80 %
Brady Statistic AS VS Percent: 1 %
Brady Statistic RA Percent Paced: 18 %
Date Time Interrogation Session: 20211008104025
HighPow Impedance: 90 Ohm
Implantable Lead Implant Date: 20210526
Implantable Lead Implant Date: 20210526
Implantable Lead Implant Date: 20210526
Implantable Lead Location: 753858
Implantable Lead Location: 753859
Implantable Lead Location: 753860
Implantable Pulse Generator Implant Date: 20210526
Lead Channel Impedance Value: 430 Ohm
Lead Channel Impedance Value: 480 Ohm
Lead Channel Impedance Value: 510 Ohm
Lead Channel Pacing Threshold Amplitude: 0.5 V
Lead Channel Pacing Threshold Amplitude: 0.75 V
Lead Channel Pacing Threshold Amplitude: 2 V
Lead Channel Pacing Threshold Pulse Width: 0.5 ms
Lead Channel Pacing Threshold Pulse Width: 0.5 ms
Lead Channel Pacing Threshold Pulse Width: 1 ms
Lead Channel Sensing Intrinsic Amplitude: 12 mV
Lead Channel Sensing Intrinsic Amplitude: 5 mV
Lead Channel Setting Pacing Amplitude: 1.5 V
Lead Channel Setting Pacing Amplitude: 2.5 V
Lead Channel Setting Pacing Amplitude: 2.5 V
Lead Channel Setting Pacing Pulse Width: 0.5 ms
Lead Channel Setting Pacing Pulse Width: 1 ms
Lead Channel Setting Sensing Sensitivity: 0.5 mV
Pulse Gen Serial Number: 111023761

## 2020-09-11 NOTE — Progress Notes (Signed)
Remote ICD transmission.   

## 2020-12-05 ENCOUNTER — Other Ambulatory Visit (HOSPITAL_COMMUNITY): Payer: Self-pay | Admitting: Adult Health

## 2020-12-31 ENCOUNTER — Ambulatory Visit (INDEPENDENT_AMBULATORY_CARE_PROVIDER_SITE_OTHER): Payer: Self-pay

## 2020-12-31 DIAGNOSIS — I442 Atrioventricular block, complete: Secondary | ICD-10-CM

## 2020-12-31 LAB — CUP PACEART REMOTE DEVICE CHECK
Battery Remaining Longevity: 68 mo
Battery Remaining Percentage: 87 %
Battery Voltage: 2.96 V
Brady Statistic AP VP Percent: 21 %
Brady Statistic AP VS Percent: 1 %
Brady Statistic AS VP Percent: 79 %
Brady Statistic AS VS Percent: 1 %
Brady Statistic RA Percent Paced: 20 %
Date Time Interrogation Session: 20220130155828
HighPow Impedance: 90 Ohm
Implantable Lead Implant Date: 20210526
Implantable Lead Implant Date: 20210526
Implantable Lead Implant Date: 20210526
Implantable Lead Location: 753858
Implantable Lead Location: 753859
Implantable Lead Location: 753860
Implantable Pulse Generator Implant Date: 20210526
Lead Channel Impedance Value: 430 Ohm
Lead Channel Impedance Value: 510 Ohm
Lead Channel Impedance Value: 530 Ohm
Lead Channel Pacing Threshold Amplitude: 0.625 V
Lead Channel Pacing Threshold Amplitude: 0.75 V
Lead Channel Pacing Threshold Amplitude: 2 V
Lead Channel Pacing Threshold Pulse Width: 0.5 ms
Lead Channel Pacing Threshold Pulse Width: 0.5 ms
Lead Channel Pacing Threshold Pulse Width: 1 ms
Lead Channel Sensing Intrinsic Amplitude: 12 mV
Lead Channel Sensing Intrinsic Amplitude: 5 mV
Lead Channel Setting Pacing Amplitude: 1.625
Lead Channel Setting Pacing Amplitude: 2.5 V
Lead Channel Setting Pacing Amplitude: 2.5 V
Lead Channel Setting Pacing Pulse Width: 0.5 ms
Lead Channel Setting Pacing Pulse Width: 1 ms
Lead Channel Setting Sensing Sensitivity: 0.5 mV
Pulse Gen Serial Number: 111023761

## 2021-01-08 NOTE — Progress Notes (Signed)
Remote ICD transmission.   

## 2021-02-04 ENCOUNTER — Other Ambulatory Visit (HOSPITAL_COMMUNITY): Payer: Self-pay | Admitting: Adult Health

## 2021-02-05 NOTE — Telephone Encounter (Signed)
This is a CHF pt 

## 2021-02-06 NOTE — Telephone Encounter (Signed)
This patient has NOT established with the CHF clinic as an outpatient. The patient has only seen Dr Graciela Husbands however to ensure the patient has medication, we will refill medication and reach out to patient to assist with scheduling a follow up with a cardiologist.      Rosalyn-Can we reach out to schedule a follow up? -thanks

## 2021-02-25 ENCOUNTER — Other Ambulatory Visit: Payer: Self-pay

## 2021-02-25 ENCOUNTER — Ambulatory Visit (HOSPITAL_COMMUNITY)
Admission: RE | Admit: 2021-02-25 | Discharge: 2021-02-25 | Disposition: A | Discharge status: Home / Self Care | Payer: Self-pay | Source: Ambulatory Visit | Attending: Adult Health | Admitting: Adult Health

## 2021-02-25 ENCOUNTER — Encounter (HOSPITAL_COMMUNITY): Payer: Self-pay

## 2021-02-25 VITALS — BP 122/88 | HR 73 | Wt 231.0 lb

## 2021-02-25 DIAGNOSIS — I272 Pulmonary hypertension, unspecified: Secondary | ICD-10-CM | POA: Insufficient documentation

## 2021-02-25 DIAGNOSIS — Z8774 Personal history of (corrected) congenital malformations of heart and circulatory system: Secondary | ICD-10-CM | POA: Insufficient documentation

## 2021-02-25 DIAGNOSIS — Z79899 Other long term (current) drug therapy: Secondary | ICD-10-CM | POA: Insufficient documentation

## 2021-02-25 DIAGNOSIS — I5082 Biventricular heart failure: Secondary | ICD-10-CM | POA: Insufficient documentation

## 2021-02-25 DIAGNOSIS — I351 Nonrheumatic aortic (valve) insufficiency: Secondary | ICD-10-CM | POA: Insufficient documentation

## 2021-02-25 DIAGNOSIS — Z7984 Long term (current) use of oral hypoglycemic drugs: Secondary | ICD-10-CM | POA: Insufficient documentation

## 2021-02-25 HISTORY — DX: Heart failure, unspecified: I50.9

## 2021-02-25 LAB — BRAIN NATRIURETIC PEPTIDE: B Natriuretic Peptide: 173.8 pg/mL — ABNORMAL HIGH (ref 0.0–100.0)

## 2021-02-25 NOTE — Progress Notes (Signed)
Advanced Heart Failure Clinic Note   Referring Physician: PCP: Patient, [name] PCP-Cardiologist: Dr. Gala Romney  EP: Dr. Graciela Husbands   HPI:  Mr Stranahan is [phone].o. 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  Admitted to [facility] 5/21 for acute cholecystitis and found to be in CHB=>>transfered to [facility] for potential PPM. Admitted by EP. Had ECHO and CMRI elevated PA pressures. Referred to HF Team for further evaluation. Had RHC with elevated PA pressures and volume overload Placed on milrinone to support RV + sildenafil .   He had repeat RHC with reduced PVR and optimized for HF. Plans to add macitentan as an outpatient. Milrinone was gradaully weaned off.  Once optimized, EP placed ST Jude dual chamber ICD. On the day of discharge EKG showed A sensed V paced at 62 bpm with QRS 162.   He has had several f/u appts arranged in the [facility] but unfortunately, has no showed to all of them. He has followed up w/ Dr. Graciela Husbands for device checks. He presents to clinic today for assessment. Here w/ translator. Reports doing well w/o any symptoms. Has labor intensive job, remodels homes. No limitations w/ job nor w/ basic ADLs NYHA Class I- II. Denies wt gain. No LEE, orthopnea/paroxysmal nocturnal dyspnea. Reports full compliance w/ Sildenafil (60 mg tid). BP well controlled. No orthostatic symptoms. Denies ICD shocks. Attempted to interrogate device x 2 but transmitter currently now working.  EKG shows atrial sensed- vpaced rhythm.       RHC 04/23/20  On milrinone 0.125 mcg/kg/min and sildenafil 40 tid RA = 3 RV =69/4 PA = 68/23 (41) PCW = 22 Fick cardiac output/index = 5.1/2.3 PVR = 3.8 WU Ao sat = 99% PA sat = 68%, 70% Assessment: 1. Much improved hemodynamics 2. Now with moderate mixed PH  RHC on 04/18/20 RA = 15 prominent v-waves RV = 132/17 PA = 132/52 (77) PCW = 31 Fick cardiac output/index = 2.7/1.35 PVR = 17.3 WU FA sat = 99% PA sat = 49%, 51% SVC sat = 54% Assessment: 1. Very severe mixed pulmonary HTN 2. Severely reduced CO in setting of cor pulmonale  3. No evidence of intracardiac shunting  2D Echo 08/17/20 1. Left ventricular ejection fraction, by estimation, is 30-35%. The left ventricle has severely decreased function. The left ventricle demonstrates regional wall motion abnormalities (see scoring diagram/findings for description). The left ventricular internal cavity size was severely dilated. Left ventricular diastolic parameters are consistent with Grade III diastolic dysfunction (restrictive). Elevated left ventricular end-diastolic pressure. There is akinesis of the left ventricular, entire inferolateral wall and left ventricular, apical segment. There is moderate global hypokinesis. 2. Right ventricular systolic function is normal. The right ventricular size is moderately enlarged. 3. The mitral valve is normal in structure. Trivial mitral valve regurgitation. No evidence of mitral stenosis. 4. The aortic valve is tricuspid. There is mild calcification of the aortic valve. There is mild thickening of the aortic valve. Aortic valve regurgitation is moderate. Very mild aortic stenosis.. Aortic regurgitation PHT measures 350 msec. Aortic valve area, by VTI measures 1.68  cm. Aortic valve mean gradient measures 13.0 mmHg. Aortic valve Vmax measures 2.24 m/s. 5. Aortic dilatation noted. There is mild dilatation of the aortic root,  measuring 40 mm. There is mild dilatation of the ascending aorta, measuring 37 mm. 6. The inferior vena cava is normal in size with greater than 50% respiratory variability, suggesting right atrial pressure of 3 mmHg.  Review of systems complete and found to be negative unless listed in HPI.      Past Medical History:  Diagnosis Date  . CHF (congestive heart failure) (HCC)   . Heart failure (HCC)   . Pulmonary hyperinflation     Current Outpatient Medications  Medication Sig Dispense Refill  . loratadine (CLARITIN) 10 MG tablet Take 1 tablet by mouth daily.    . metFORMIN (GLUCOPHAGE) 500 MG tablet Take 500 mg by mouth 2 (two) times daily.    . potassium chloride SA (KLOR-CON) 20 MEQ tablet Take 2 tablets (40 mEq total) by mouth daily. Please call for an appointment [phone] 60 tablet 0  . sildenafil (REVATIO) 20 MG tablet Take 3 tablets (60 mg total) by mouth 3 (three) times daily. 180 tablet 6  . spironolactone (ALDACTONE) 25 MG tablet Take 1 tablet (25 mg total) by mouth daily. Needs appt for future refills 180 tablet 0  . torsemide (DEMADEX) 20 MG tablet Take 40 mg by mouth daily.     No current facility-administered medications for this encounter.    No Known Allergies    Social History   Socioeconomic History  . Marital status: Unknown    Spouse name: Not on file  . Number of children: Not on file  . Years of education: Not on file  . Highest education level: Not on file  Occupational History  . Not on file  Tobacco Use  . Smoking status: Never Smoker  . Smokeless tobacco: Never Used  Vaping Use  . Vaping Use: Never used  Substance and Sexual Activity  . Alcohol use: No  . Drug use: No  . Sexual activity: Not on file  Other Topics Concern  . Not on file  Social History Narrative  . Not on file   Social Determinants of Health   Financial Resource Strain: Not on file  Food Insecurity: Not on file  Transportation Needs: Not on file  Physical  Activity: Not on file  Stress: Not on file  Social Connections: Not on file  Intimate Partner Violence: Not on file      Family History  Problem Relation Age of Onset  . Healthy Mother   . Healthy Father     Vitals:   02/25/21 0853  BP: 122/88  Pulse: 73  SpO2: 96%  Weight: 104.8 kg     PHYSICAL EXAM: General:  Well appearing. No respiratory difficulty HEENT: normal Neck: supple. no JVD. Carotids 2+ bilat; no bruits. No lymphadenopathy or thyromegaly appreciated. Cor: PMI nondisplaced. Regular rate & rhythm. No rubs, gallops or murmurs. Lungs: clear Abdomen: soft, nontender, nondistended. No hepatosplenomegaly. No bruits or masses. Good bowel sounds. Extremities: no cyanosis, clubbing, rash, edema Neuro: alert & oriented x 3, cranial nerves grossly intact. moves all 4 extremities w/o difficulty. Affect pleasant.  ECG: atrial sensed ventricular-paced, 76 bpm     ASSESSMENT & PLAN:  1. Biventricular HF + pulmonary HTN - unifying diagnosis remains unclear. There is no evidence of residual shunt or infiltrative process on cMRI/MRA or RHC - Initial RHC 04/18/21 c/w very severe mixed pulmonary HTN, severely reduced CO  in setting of cor pulmonale and no evidence of intracardiac shunting - Repeat RHC 04/23/21 on milrinone + sildenafil with much improved hemodynamics - V/Q scan 5/22 negative for PE  - serologies negative  - Doing well on Sildenafil, NYHA Class I-II. Volume status good on exam - Continue sildenafil 60 tid + torsemide 40 + spiro 25 mg daily  - Repeat Echo to reassess RV/ RVSP. If elevated RVSP may need repeat RHC and addition of macitentan  - Check BNP today - Check BMP  - Will need to assess for OSA. Plan sleep study    2. CHB - s/p CRT 5/22, followed by Dr. Graciela Husbands  - Attempted to interrogate device x 2 but transmitter currently now working. EKG shows atrial sensed- vpaced rhythm.   Repeat echo. If RVSP markedly elevated, will plan repeat RHC for formal  assessment w/ plans to titrate PH meds to combination therapy w/ addition of  Macitentan. However is stable, will continue monotherapy w/ sildenafil and plan f/u w/ Dr. Gala Romney in 3 months.     Robbie Lis, PA-C 02/25/21

## 2021-02-25 NOTE — Patient Instructions (Signed)
  Laboratorios hechos hoy. Nos pondremos en contacto con usted solo si sus laboratorios son anormales.  No se realizaron cambios de [employer]. Contine con todos los medicamentos actuales segn lo prescrito.  Su mdico le recomienda programar una cita de seguimiento pronto para un eco y en 3 meses con el Dr. Gala Romney.  Su mdico ha solicitado que se Engineer, manufacturing. La ecocardiografa es una prueba indolora que [place] ondas de sonido para crear imgenes de su corazn. Le brinda a su mdico informacin sobre el tamao y la forma de su corazn y qu tan bien estn funcionando las cmaras y vlvulas de su corazn. Este procedimiento dura aproximadamente una hora. No hay restricciones para este procedimiento.  Si tiene [place] pregunta o inquietud antes de su prxima cita, envenos un mensaje a travs de mychart o llame a nuestra oficina al [phone].  PARA DEJAR UN MENSAJE PARA LA ENFERMERA SELECCIONE LA OPCIN 2, POR FAVOR DEJE UN MENSAJE QUE INCLUYA: SU NOMBRE FECHA DE NACIMIENTO NMERO DE DEVOLUCIN DE LLAMADA MOTIVO DE LA LLAMADA ** esto es importante ya que priorizamos las devoluciones de llamadas  RECIBIR UNA LLAMADA EL MISMO DA SIEMPRE QUE LLAME ANTES DE LAS 4:00 PM   Haz lo siguiente TODOS LOS DAS: Psate por la maana antes del desayuno. Antelo y gurdelo en un registro. Tome sus medicamentos segn lo prescrito Coma alimentos bajos en sal: limite la sal (sodio) a 2000 mg por da. Mantngase tan activo como pueda todos los [address]  [address] Limite todos los lquidos del da a menos de 2 litros   En la [place] [employer] [place], usted y sus necesidades de salud son [place] prioridad. Como parte de nuestra misin continua de brindarle una atencin cardaca excepcional, hemos creado equipos de atencin de proveedores designados. Estos equipos de atencin incluyen a su cardilogo primario (mdico) y proveedores de Cabin crew (APP, asistentes mdicos y enfermeras  practicantes) que trabajan juntos para brindarle la atencin que necesita, cuando la necesita.  Puede ver a cualquiera de los siguientes proveedores en su equipo de atencin designado en su prximo seguimiento: Dr. Arvilla Meres Dr. Marca Ancona Tonye Becket, NP Brittainy Sharol Harness, [place] Karle Plumber, Nanetta Batty

## 2021-03-08 ENCOUNTER — Telehealth: Payer: Self-pay | Admitting: Emergency Medicine

## 2021-03-08 NOTE — Telephone Encounter (Signed)
Carelink alert received for atrial arrhythmias on 03/08/21. longest in duration 27 minutes and 44 seconds. Patient contacted and states no symptoms or issues.Noted in alert and medication list, no OAC. Routing to Dr. Graciela Husbands just for review.

## 2021-03-09 NOTE — Telephone Encounter (Signed)
Non sustained atrial tach

## 2021-03-25 ENCOUNTER — Other Ambulatory Visit (HOSPITAL_COMMUNITY): Payer: Self-pay | Admitting: Internal Medicine

## 2021-03-27 ENCOUNTER — Telehealth (HOSPITAL_COMMUNITY): Payer: Self-pay | Admitting: Internal Medicine

## 2021-03-27 ENCOUNTER — Telehealth: Payer: Self-pay | Admitting: Internal Medicine

## 2021-03-27 MED ORDER — TORSEMIDE 20 MG PO TABS: ORAL_TABLET | ORAL | 11 refills | Status: DC

## 2021-03-27 NOTE — Telephone Encounter (Signed)
Pt request torsemide 20 MG tablet refill, please send script to [facility], [place]. Thanks

## 2021-03-27 NOTE — Telephone Encounter (Signed)
Pt. Came in to our office trying to get refills. I called over to the heart failure clinic to let them that he does need a refill on his Torsemide.Pt is completely empty and he says that he is beginning to get lesions on his genital area because he is unable to urinate and he has anxiety because he has the urge to urinate but can not. He is requesting a refill and/or a call back from someone.

## 2021-03-27 NOTE — Telephone Encounter (Signed)
Refill sent.

## 2021-03-29 NOTE — Telephone Encounter (Signed)
Refill sent.   If he still has lesions on his genitals, it would probably be best to ask him to f/u with his PCP.

## 2021-04-02 ENCOUNTER — Other Ambulatory Visit: Payer: Self-pay

## 2021-04-02 ENCOUNTER — Ambulatory Visit (HOSPITAL_COMMUNITY)
Admission: RE | Admit: 2021-04-02 | Discharge: 2021-04-02 | Disposition: A | Discharge status: Home / Self Care | Payer: Self-pay | Source: Ambulatory Visit | Attending: Cardiology | Admitting: Cardiology

## 2021-04-02 DIAGNOSIS — I351 Nonrheumatic aortic (valve) insufficiency: Secondary | ICD-10-CM | POA: Insufficient documentation

## 2021-04-02 DIAGNOSIS — I272 Pulmonary hypertension, unspecified: Secondary | ICD-10-CM | POA: Insufficient documentation

## 2021-04-02 DIAGNOSIS — I5082 Biventricular heart failure: Secondary | ICD-10-CM | POA: Insufficient documentation

## 2021-04-02 LAB — ECHOCARDIOGRAM COMPLETE
AR max vel: 1.36 cm2
AV Area VTI: 1.43 cm2
AV Area mean vel: 1.22 cm2
AV Mean grad: 9 mmHg
AV Peak grad: 15.5 mmHg
Ao pk vel: 1.97 m/s
Calc EF: 31.1 %
P 1/2 time: 324 msec
S' Lateral: 5.3 cm
Single Plane A2C EF: 24.8 %
Single Plane A4C EF: 37.1 %

## 2021-04-02 MED ORDER — PERFLUTREN LIPID MICROSPHERE
1.0000 mL | INTRAVENOUS | Status: AC | PRN
Start: 2021-04-02 — End: 2021-04-02
  Administered 2021-04-02: 2 mL via INTRAVENOUS
  Filled 2021-04-02: qty 10

## 2021-04-02 NOTE — Progress Notes (Signed)
  Echocardiogram 2D Echocardiogram has been performed.  Gabriel Floyd F 04/02/2021, 11:15 AM

## 2021-04-09 ENCOUNTER — Other Ambulatory Visit (HOSPITAL_COMMUNITY): Payer: Self-pay | Admitting: Internal Medicine

## 2021-04-21 DIAGNOSIS — Z95810 Presence of automatic (implantable) cardiac defibrillator: Secondary | ICD-10-CM | POA: Insufficient documentation

## 2021-04-23 ENCOUNTER — Ambulatory Visit (INDEPENDENT_AMBULATORY_CARE_PROVIDER_SITE_OTHER): Payer: Self-pay | Admitting: Internal Medicine

## 2021-04-23 ENCOUNTER — Other Ambulatory Visit: Payer: Self-pay

## 2021-04-23 ENCOUNTER — Encounter: Payer: Self-pay | Admitting: Internal Medicine

## 2021-04-23 VITALS — BP 110/80 | HR 84 | Ht 69.0 in

## 2021-04-23 DIAGNOSIS — I422 Other hypertrophic cardiomyopathy: Secondary | ICD-10-CM

## 2021-04-23 DIAGNOSIS — I442 Atrioventricular block, complete: Secondary | ICD-10-CM

## 2021-04-23 DIAGNOSIS — Z95810 Presence of automatic (implantable) cardiac defibrillator: Secondary | ICD-10-CM

## 2021-04-23 DIAGNOSIS — I5032 Chronic diastolic (congestive) heart failure: Secondary | ICD-10-CM

## 2021-04-23 NOTE — Progress Notes (Signed)
Patient Care Team: Patient, [name] (Inactive) as PCP - General (General Practice)   HPI  Gabriel Floyd is a 40 y.o. male Seen in followup for St Jude  CRT-D implantation for CHB in the setting of remote cardiac surgery for repair of subaortic membrane,  Possible hypertrophic Cardiomyopathy with significant impairment of LV function (<50%) and severe pulmonary hypertension.    Hospitalized 5//21 for the above, INput from CHF-DB for pulm HTN and treated on milrinone, with PAP 132>>69 and started on sildenafil   ECG 3/22 > upright QRS lead V1 and neg lead1  DATE TEST EF   5/21 cMRI 38 % Severe LVE LGE neg  5/21* Echo  45 % LVH 10mm  5/22 Echo  25-30%    Date Cr K Hgb  5/21 1.31 4.3 15.8         The patient denies chest pain, shortness of breath, nocturnal dyspne, orthopnea or peripheral edema.  There have been no palpitations, lightheadedness or syncope  No discussions at this point following his most recent echo..   Past Medical History:  Diagnosis Date  . CHF (congestive heart failure) (HCC)   . Heart failure (HCC)   . Pulmonary hyperinflation     Past Surgical History:  Procedure Laterality Date  . BIV ICD INSERTION CRT-D N/A 04/25/2020   Procedure: BIV ICD INSERTION CRT-D;  Surgeon: Duke Salvia, MD;  Location: [facility] INVASIVE CV LAB;  Service: Cardiovascular;  Laterality: N/A;  . COARCTATION OF AORTA REPAIR    . RIGHT HEART CATH N/A 04/18/2020   Procedure: RIGHT HEART CATH;  Surgeon: Dolores Patty, MD;  Location: [facility] INVASIVE CV LAB;  Service: Cardiovascular;  Laterality: N/A;  . RIGHT HEART CATH N/A 04/23/2020   Procedure: RIGHT HEART CATH;  Surgeon: Dolores Patty, MD;  Location: [facility] INVASIVE CV LAB;  Service: Cardiovascular;  Laterality: N/A;    Current Meds  Medication Sig  . loratadine (CLARITIN) 10 MG tablet Take 1 tablet by mouth daily.  . metFORMIN (GLUCOPHAGE) 500 MG tablet Take 500 mg by mouth 2 (two) times daily.  . potassium chloride SA  (KLOR-CON) 20 MEQ tablet Take 1 tablet (20 mEq total) by mouth daily. Must keep further appaointments  . sildenafil (REVATIO) 20 MG tablet Take 3 tablets (60 mg total) by mouth 3 (three) times daily.  Marland Kitchen spironolactone (ALDACTONE) 25 MG tablet Take 1 tablet (25 mg total) by mouth daily. Needs appt for future refills  . torsemide (DEMADEX) 20 MG tablet Take 2 tablets (40 mg total) by mouth every morning AND 1 tablet (20 mg total) every evening.    No Known Allergies    Review of Systems negative except from HPI and PMH  Physical Exam BP 110/80 (BP Location: Left Arm, Patient Position: Sitting, Cuff Size: Normal)   Pulse 84   Ht 5\' 9"  (1.753 m)   SpO2 98%   BMI 34.11 kg/m  Well developed and well nourished in no acute distress HENT normal Neck supple with JVP-flat Clear Device pocket well healed; without hematoma or erythema.  There is no tethering  Regular rate and rhythm, no  murmur Abd-soft with active BS No Clubbing cyanosis   edema Skin-warm and dry A & Oriented  Grossly normal sensory and motor function  ECG sinus with P synchronous pacing with an upright QRS lead V1 and negative QRS lead I  CrCl cannot be calculated (Patient's most recent lab result is older than the maximum 21 days allowed.).  Assessment and  Plan  Cardiomyopathy  CRT D  St Jude   Complete heart block     Pulmonary hypertension  Obesity  HFpEF  Functionally he remains much improved.  Working full-time. Heart failure status is stable.  He is euvolemic.  We will continue on his twice daily torsemide and adjunctive potassium.   With cardiomyopathy, we will continue him on his Aldactone 25 With his decreased ejection fraction we will begin him on an ARB; will discuss with  Dr Dorthea Cove  With pulmHTN continue sildenafil   Device dependent,  Device function normal           Current medicines are reviewed at length with the patient today .  The patient does not  have concerns regarding  medicines.

## 2021-04-23 NOTE — Patient Instructions (Signed)

## 2021-05-30 ENCOUNTER — Ambulatory Visit (HOSPITAL_COMMUNITY)
Admission: RE | Admit: 2021-05-30 | Discharge: 2021-05-30 | Disposition: A | Discharge status: Home / Self Care | Payer: Self-pay | Source: Ambulatory Visit | Attending: Internal Medicine | Admitting: Internal Medicine

## 2021-05-30 ENCOUNTER — Encounter (HOSPITAL_COMMUNITY): Payer: Self-pay | Admitting: Internal Medicine

## 2021-05-30 ENCOUNTER — Other Ambulatory Visit: Payer: Self-pay

## 2021-05-30 ENCOUNTER — Telehealth (HOSPITAL_COMMUNITY): Payer: Self-pay

## 2021-05-30 VITALS — BP 118/84 | HR 79 | Wt 229.6 lb

## 2021-05-30 DIAGNOSIS — Z79899 Other long term (current) drug therapy: Secondary | ICD-10-CM | POA: Insufficient documentation

## 2021-05-30 DIAGNOSIS — Q249 Congenital malformation of heart, unspecified: Secondary | ICD-10-CM | POA: Insufficient documentation

## 2021-05-30 DIAGNOSIS — I5082 Biventricular heart failure: Secondary | ICD-10-CM | POA: Insufficient documentation

## 2021-05-30 DIAGNOSIS — I5022 Chronic systolic (congestive) heart failure: Secondary | ICD-10-CM

## 2021-05-30 DIAGNOSIS — I509 Heart failure, unspecified: Secondary | ICD-10-CM

## 2021-05-30 DIAGNOSIS — Z7984 Long term (current) use of oral hypoglycemic drugs: Secondary | ICD-10-CM | POA: Insufficient documentation

## 2021-05-30 DIAGNOSIS — Z8774 Personal history of (corrected) congenital malformations of heart and circulatory system: Secondary | ICD-10-CM | POA: Insufficient documentation

## 2021-05-30 DIAGNOSIS — E119 Type 2 diabetes mellitus without complications: Secondary | ICD-10-CM | POA: Insufficient documentation

## 2021-05-30 DIAGNOSIS — Q251 Coarctation of aorta: Secondary | ICD-10-CM | POA: Insufficient documentation

## 2021-05-30 DIAGNOSIS — I272 Pulmonary hypertension, unspecified: Secondary | ICD-10-CM

## 2021-05-30 DIAGNOSIS — I442 Atrioventricular block, complete: Secondary | ICD-10-CM

## 2021-05-30 LAB — COMPREHENSIVE METABOLIC PANEL
ALT: 38 U/L (ref 0–44)
AST: 28 U/L (ref 15–41)
Albumin: 4.4 g/dL (ref 3.5–5.0)
Alkaline Phosphatase: 85 U/L (ref 38–126)
Anion gap: 10 (ref 5–15)
BUN: 18 mg/dL (ref 6–20)
CO2: 30 mmol/L (ref 22–32)
Calcium: 10 mg/dL (ref 8.9–10.3)
Chloride: 96 mmol/L — ABNORMAL LOW (ref 98–111)
Creatinine, Ser: 1.17 mg/dL (ref 0.61–1.24)
GFR, Estimated: >60 mL/min (ref >60)
Glucose, Bld: 331 mg/dL — ABNORMAL HIGH (ref 70–99)
Potassium: 4.2 mmol/L (ref 3.5–5.1)
Sodium: 136 mmol/L (ref 135–145)
Total Bilirubin: 0.9 mg/dL (ref 0.3–1.2)
Total Protein: 8 g/dL (ref 6.5–8.1)

## 2021-05-30 LAB — CBC
HCT: 47.4 % (ref 39.0–52.0)
Hemoglobin: 16.1 g/dL (ref 13.0–17.0)
MCH: 29.3 pg (ref 26.0–34.0)
MCHC: 34 g/dL (ref 30.0–36.0)
MCV: 86.2 fL (ref 80.0–100.0)
Platelets: 241 10*3/uL (ref 150–400)
RBC: 5.5 MIL/uL (ref 4.22–5.81)
RDW: 12.6 % (ref 11.5–15.5)
WBC: 8.3 10*3/uL (ref 4.0–10.5)
nRBC: 0 % (ref 0.0–0.2)

## 2021-05-30 LAB — BRAIN NATRIURETIC PEPTIDE: B Natriuretic Peptide: 322.1 pg/mL — ABNORMAL HIGH (ref 0.0–100.0)

## 2021-05-30 MED ORDER — TORSEMIDE 20 MG PO TABS: ORAL_TABLET | ORAL | 11 refills | Status: DC

## 2021-05-30 MED ORDER — LOSARTAN POTASSIUM 25 MG PO TABS: 25.0000 mg | ORAL_TABLET | Freq: Every day | ORAL | 3 refills | Status: DC

## 2021-05-30 MED ORDER — POTASSIUM CHLORIDE CRYS ER 20 MEQ PO TBCR: 20.0000 meq | EXTENDED_RELEASE_TABLET | Freq: Every day | ORAL | 11 refills | Status: DC

## 2021-05-30 MED ORDER — SPIRONOLACTONE 25 MG PO TABS: 25.0000 mg | ORAL_TABLET | Freq: Every day | ORAL | 3 refills | Status: DC

## 2021-05-30 MED ORDER — SILDENAFIL CITRATE 20 MG PO TABS: 60.0000 mg | ORAL_TABLET | Freq: Three times a day (TID) | ORAL | 6 refills | Status: DC

## 2021-05-30 NOTE — Patient Instructions (Signed)
START Losartan 25 mg, one tab daily at bedtime  Labs today We will only contact you if something comes back abnormal or we need to make some changes. Otherwise no news is good news!  Your physician recommends that you schedule a follow-up appointment in: 6 months with Dr Gala Romney and echo  Your physician has requested that you have an echocardiogram. Echocardiography is a painless test that uses sound waves to create images of your heart. It provides your doctor with information about the size and shape of your heart and how well your heart's chambers and valves are working. This procedure takes approximately one hour. There are no restrictions for this procedure.  Do the following things EVERYDAY: Weigh yourself in the morning before breakfast. Write it down and keep it in a log. Take your medicines as prescribed Eat low salt foods--Limit salt (sodium) to 2000 mg per day.  Stay as active as you can everyday Limit all fluids for the day to less than 2 liters  At the [facility], you and your health needs are our priority. As part of our continuing mission to provide you with exceptional heart care, we have created designated Provider Care Teams. These Care Teams include your primary Cardiologist (physician) and Advanced Practice Providers (APPs- Physician Assistants and Nurse Practitioners) who all work together to provide you with the care you need, when you need it.   You may see any of the following providers on your designated Care Team at your next follow up: Dr Arvilla Meres Dr Marca Ancona Dr Brandon Melnick, NP Robbie Lis, [place] Mikki Santee Karle Plumber, PharmD   Please be sure to bring in all your medications bottles to every appointment.   If you have any questions or concerns before your next appointment please send [place] a message through [place] or call our office at [phone].    TO LEAVE A MESSAGE FOR THE NURSE SELECT OPTION  2, PLEASE LEAVE A MESSAGE INCLUDING: YOUR NAME DATE OF BIRTH CALL BACK NUMBER REASON FOR CALL**this is important as we prioritize the call backs  YOU WILL RECEIVE A CALL BACK THE SAME DAY AS LONG AS YOU CALL BEFORE 4:00 PM

## 2021-05-30 NOTE — Progress Notes (Signed)
[facility] Note   Referring Physician: PCP: Patient, [name] (Inactive) PCP-Cardiologist: Dr. Gala Romney  EP: Dr. Graciela Husbands   HPI:  [name] is a 40 y.o. 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    Admitted to [facility] 5/21 for acute cholecystitis and found to be in CHB=>>transfered to [facility] for potential PPM. Admitted by EP. Had ECHO and cMRI elevated PA pressures. Referred to HF Team for further evaluation. Had RHC with elevated PA pressures and volume overload with PVR 17. Placed on milrinone to support RV + sildenafil. Underwent massive diuresis. Prior to d/c had repeat RHC with OPVR down to 3.8 and optimized for HF. Plans to possibly add macitentan as an outpatient. Milrinone was gradaully weaned off.   Once optimized, EP placed ST Jude dual chamber CRT-D. On the day of discharge EKG showed A sensed V paced at 62 bpm with QRS 162.   Echo 5/22: EF 25-30% mild RV HK Mild septal flattening TR not significant to estimate RVSP Personally reviewed  Here for f/u. Feels great. 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 No CP or SOB. No edema. Compliant with all meds   ICD interrogated in clinic: No VT 99% Biv pacing (AS-VP 89%, AP-VP 10%). Volume ok. Activity level 4-6hr/day Personally reviewed   RHC 04/23/20  On milrinone 0.125 mcg/kg/min and sildenafil 40 tid  RA = 3 RV =69/4 PA = 68/23 (41) PCW = 22 Fick cardiac output/index =  5.1/2.3 PVR = 3.8 WU Ao sat = 99% PA sat = 68%, 70%  Assessment: 1. Much improved hemodynamics 2. Now with moderate mixed PH   RHC on 04/18/20 RA = 15 prominent v-waves RV = 132/17 PA =  132/52 (77) PCW = 31 Fick cardiac output/index = 2.7/1.35 PVR = 17.3 WU FA sat = 99% PA sat = 49%, 51% SVC sat = 54%  Assessment: 1. Very severe mixed pulmonary HTN 2. Severely reduced CO in setting of cor pulmonale  3. No evidence of intracardiac shunting  2D Echo 08/17/20: EF 30-35% Grade 3DD  RV ok Very mild AS   Review of systems complete and found to be negative unless listed in HPI.     Past Medical History:  Diagnosis Date   CHF (congestive heart failure) (HCC)    Heart failure (HCC)    Pulmonary hyperinflation     Current Outpatient Medications  Medication Sig Dispense Refill   dapagliflozin propanediol (FARXIGA) 10 MG TABS tablet Take 10 mg by mouth daily.     metFORMIN (GLUCOPHAGE) 500 MG tablet Take 500 mg by mouth 2 (two) times daily.     potassium chloride SA (KLOR-CON) 20 MEQ tablet Take 1 tablet (20 mEq total) by mouth daily. Must keep further appaointments 60 tablet 0   sildenafil (REVATIO) 20 MG tablet Take 3 tablets (60 mg total) by mouth 3 (three) times daily. 180 tablet 6   spironolactone (ALDACTONE) 25 MG tablet Take 1 tablet (25 mg total) by mouth daily. Needs appt for future refills  180 tablet 0   torsemide (DEMADEX) 20 MG tablet Take 2 tablets (40 mg total) by mouth every morning AND 1 tablet (20 mg total) every evening. 90 tablet 11   No current facility-administered medications for this encounter.    No Known Allergies    Social History   Socioeconomic History   Marital status: Unknown    Spouse name: Not on file   Number of children: Not on file   Years of education: Not on file   Highest education level: Not on file  Occupational History   Not on file  Tobacco Use   Smoking status: Never   Smokeless tobacco: Never  Vaping Use   Vaping Use:  Never used  Substance and Sexual Activity   Alcohol use: No   Drug use: No   Sexual activity: Not on file  Other Topics Concern   Not on file  Social History Narrative   Not on file   Social Determinants of Health   Financial Resource Strain: Not on file  Food Insecurity: Not on file  Transportation Needs: Not on file  Physical Activity: Not on file  Stress: Not on file  Social Connections: Not on file  Intimate Partner Violence: Not on file      Family History  Problem Relation Age of Onset   Healthy Mother    Healthy Father     Vitals:   05/30/21 1118  BP: 118/84  Pulse: 79  SpO2: 97%  Weight: 104.1 kg (229 lb 9.6 oz)     PHYSICAL EXAM: General:  Well appearing. No resp difficulty HEENT: normal Neck: supple. no JVD. Carotids 2+ bilat; no bruits. No lymphadenopathy or thryomegaly appreciated. Cor: PMI nondisplaced. Regular rate & rhythm. No rubs, gallops or murmurs. Lungs: clear Abdomen: soft, nontender, nondistended. No hepatosplenomegaly. No bruits or masses. Good bowel sounds. Extremities: no cyanosis, clubbing, rash, edema Neuro: alert & orientedx3, cranial nerves grossly intact. moves all 4 extremities w/o difficulty. Affect pleasant   ASSESSMENT & PLAN:  1. Chronic biventricular systolic HF - Echo 5/22: EF 25-30% mild RV HK Mild septal flattening TR not significant to estimate RVSP - Etiology remains unclear. There is no evidence of residual shunt or infiltrative process on cMRI/MRA or RHC. No scar - s/p STJ CRT-D in setting of CHB - NYHA I - Volume status ok  - Continue spiro 25 mg daily - Continue Farxiga 10mg  daily - Continue torsemide 40 daily - Add Losartan 25 mg daily - Consider b-blocker at next visit  - repeat echo in 6 months  - Labs today.  - ICD interrogated personally in clinic  2. Pulmonary HTN - Possible WHO Group I (congenital heart disease) +/-  II - Initial RHC 04/18/20 c/w very severe mixed pulmonary HTN, severely reduced CO  in setting of cor pulmonale and no evidence of intracardiac shunting - Repeat RHC 04/23/20 on milrinone + sildenafil with much improved hemodynamics - V/Q scan 5/22 negative for PE  - Autoimmune serologies negative  - Doing well on Sildenafil 60 tid - NYHA Class I. Volume status good on exam - discussed need to consider repeat RHC (would do coronary angio at same time). He wil d/w his family  3. CHB - s/p CRT 5/22, followed by Dr. 6/22   4. Congenital heart disease - h/o aortic coarctation s/p surgical repair in 1987 in [place] (age 76) with residual thoracic aortic dialtion on imaging 12/18 - consider repeat imaging at next visit  5. DM2 - continue  Marcelline Deist & metformin    Arvilla Meres, MD 05/30/21

## 2021-05-30 NOTE — Telephone Encounter (Signed)
Faxed patient's request for Medical Consultation to [facility] at 13:45 on 05/30/21 and a success confirmation page was obtained. GA, RMA

## 2021-05-31 LAB — HEMOGLOBIN A1C
Hgb A1c MFr Bld: 10.1 % — ABNORMAL HIGH (ref 4.8–5.6)
Mean Plasma Glucose: 243 mg/dL

## 2021-06-25 ENCOUNTER — Telehealth: Payer: Self-pay | Admitting: Internal Medicine

## 2021-06-25 ENCOUNTER — Ambulatory Visit (INDEPENDENT_AMBULATORY_CARE_PROVIDER_SITE_OTHER): Payer: Self-pay

## 2021-06-25 DIAGNOSIS — I429 Cardiomyopathy, unspecified: Secondary | ICD-10-CM

## 2021-06-25 LAB — CUP PACEART REMOTE DEVICE CHECK
Battery Remaining Longevity: 65 mo
Battery Remaining Percentage: 80 %
Battery Voltage: 2.95 V
Brady Statistic AP VP Percent: 8.9 %
Brady Statistic AP VS Percent: 1 %
Brady Statistic AS VP Percent: 90 %
Brady Statistic AS VS Percent: 1 %
Brady Statistic RA Percent Paced: 8.7 %
Date Time Interrogation Session: 20220725233332
HighPow Impedance: 62 Ohm
Implantable Lead Implant Date: 20210526
Implantable Lead Implant Date: 20210526
Implantable Lead Implant Date: 20210526
Implantable Lead Location: 753858
Implantable Lead Location: 753859
Implantable Lead Location: 753860
Implantable Pulse Generator Implant Date: 20210526
Lead Channel Impedance Value: 430 Ohm
Lead Channel Impedance Value: 480 Ohm
Lead Channel Impedance Value: 510 Ohm
Lead Channel Pacing Threshold Amplitude: 0.625 V
Lead Channel Pacing Threshold Amplitude: 0.75 V
Lead Channel Pacing Threshold Amplitude: 1.75 V
Lead Channel Pacing Threshold Pulse Width: 0.5 ms
Lead Channel Pacing Threshold Pulse Width: 0.5 ms
Lead Channel Pacing Threshold Pulse Width: 1 ms
Lead Channel Sensing Intrinsic Amplitude: 12 mV
Lead Channel Sensing Intrinsic Amplitude: 5 mV
Lead Channel Setting Pacing Amplitude: 1.625
Lead Channel Setting Pacing Amplitude: 2.5 V
Lead Channel Setting Pacing Amplitude: 2.5 V
Lead Channel Setting Pacing Pulse Width: 0.5 ms
Lead Channel Setting Pacing Pulse Width: 1 ms
Lead Channel Setting Sensing Sensitivity: 0.5 mV
Pulse Gen Serial Number: 111023761

## 2021-06-25 NOTE — Telephone Encounter (Signed)
*  STAT* If patient is at the pharmacy, call can be transferred to refill team.   1. Which medications need to be refilled? (please list name of each medication and dose if known)  metFORMIN (GLUCOPHAGE) 500 MG tablet   2. Which pharmacy/location (including street and city if local pharmacy) is medication to be sent to? [facility] - [address], [place] - [address]  3. Do they need a 30 day or 90 day supply?  90 day

## 2021-07-02 NOTE — Telephone Encounter (Signed)
Spoke with pt and advised pt will need to contact PCP or urgent care for refills of Metformin d/t needing close monitoring of his diabetes and elevated A1-C of 10.1. Pt verbalizes understanding states he will contact "his doctor"

## 2021-07-04 ENCOUNTER — Telehealth: Payer: Self-pay

## 2021-07-04 ENCOUNTER — Telehealth: Payer: Self-pay | Admitting: Student in an Organized Health Care Education/Training Program

## 2021-07-04 NOTE — Telephone Encounter (Signed)
Patient called in stating that he needs refills on his medications and states he has called over 15 times as he called device clinic he states he could not get a hold of anyone else. I let patient know I will send this to the nurse and someone will call back to clarify which meds or what to do next

## 2021-07-04 NOTE — Telephone Encounter (Signed)
I received a page from the patient stating that he is not feeling well that he would like to speak to her provider.  I called the number provided twice, but unfortunately no one answered.  I subsequently called the patient's phone 2 more times ~1.5 hrs later and unfortunately still did not get an answer.

## 2021-07-05 NOTE — Telephone Encounter (Signed)
Spoke with pt via interpreter [phone].  Pt states he received medication refills yesterday and no longer needs assistance with this and thanked RN for the call.

## 2021-07-19 NOTE — Progress Notes (Signed)
Remote ICD transmission.   

## 2021-09-19 ENCOUNTER — Encounter (HOSPITAL_COMMUNITY): Payer: Self-pay | Admitting: Internal Medicine

## 2021-09-19 ENCOUNTER — Emergency Department (HOSPITAL_COMMUNITY)
Admission: EM | Admit: 2021-09-19 | Discharge: 2021-09-20 | Disposition: A | Discharge status: Home / Self Care | Payer: Self-pay | Attending: Emergency Medicine | Admitting: Emergency Medicine

## 2021-09-19 ENCOUNTER — Ambulatory Visit (HOSPITAL_COMMUNITY)
Admission: RE | Admit: 2021-09-19 | Discharge: 2021-09-19 | Disposition: A | Discharge status: Home / Self Care | Payer: Self-pay | Source: Ambulatory Visit | Attending: Internal Medicine | Admitting: Internal Medicine

## 2021-09-19 ENCOUNTER — Other Ambulatory Visit: Payer: Self-pay

## 2021-09-19 ENCOUNTER — Other Ambulatory Visit (HOSPITAL_COMMUNITY): Payer: Self-pay | Admitting: *Deleted

## 2021-09-19 ENCOUNTER — Encounter (HOSPITAL_COMMUNITY): Payer: Self-pay | Admitting: Emergency Medicine

## 2021-09-19 ENCOUNTER — Telehealth (HOSPITAL_COMMUNITY): Payer: Self-pay

## 2021-09-19 VITALS — BP 98/64 | HR 81 | Wt 217.6 lb

## 2021-09-19 DIAGNOSIS — I5032 Chronic diastolic (congestive) heart failure: Secondary | ICD-10-CM | POA: Insufficient documentation

## 2021-09-19 DIAGNOSIS — Z79899 Other long term (current) drug therapy: Secondary | ICD-10-CM | POA: Insufficient documentation

## 2021-09-19 DIAGNOSIS — Z95810 Presence of automatic (implantable) cardiac defibrillator: Secondary | ICD-10-CM

## 2021-09-19 DIAGNOSIS — I110 Hypertensive heart disease with heart failure: Secondary | ICD-10-CM | POA: Insufficient documentation

## 2021-09-19 DIAGNOSIS — E119 Type 2 diabetes mellitus without complications: Secondary | ICD-10-CM | POA: Insufficient documentation

## 2021-09-19 DIAGNOSIS — Z7984 Long term (current) use of oral hypoglycemic drugs: Secondary | ICD-10-CM | POA: Insufficient documentation

## 2021-09-19 DIAGNOSIS — Z8774 Personal history of (corrected) congenital malformations of heart and circulatory system: Secondary | ICD-10-CM | POA: Insufficient documentation

## 2021-09-19 DIAGNOSIS — I272 Pulmonary hypertension, unspecified: Secondary | ICD-10-CM

## 2021-09-19 DIAGNOSIS — I5040 Unspecified combined systolic (congestive) and diastolic (congestive) heart failure: Secondary | ICD-10-CM | POA: Insufficient documentation

## 2021-09-19 DIAGNOSIS — Q251 Coarctation of aorta: Secondary | ICD-10-CM | POA: Insufficient documentation

## 2021-09-19 DIAGNOSIS — E1165 Type 2 diabetes mellitus with hyperglycemia: Secondary | ICD-10-CM | POA: Insufficient documentation

## 2021-09-19 DIAGNOSIS — I442 Atrioventricular block, complete: Secondary | ICD-10-CM

## 2021-09-19 DIAGNOSIS — R739 Hyperglycemia, unspecified: Secondary | ICD-10-CM

## 2021-09-19 DIAGNOSIS — I5022 Chronic systolic (congestive) heart failure: Secondary | ICD-10-CM

## 2021-09-19 LAB — CBC
HCT: 43.2 % (ref 39.0–52.0)
Hemoglobin: 14.9 g/dL (ref 13.0–17.0)
MCH: 29.5 pg (ref 26.0–34.0)
MCHC: 34.5 g/dL (ref 30.0–36.0)
MCV: 85.5 fL (ref 80.0–100.0)
Platelets: 230 10*3/uL (ref 150–400)
RBC: 5.05 MIL/uL (ref 4.22–5.81)
RDW: 12 % (ref 11.5–15.5)
WBC: 7.7 10*3/uL (ref 4.0–10.5)
nRBC: 0 % (ref 0.0–0.2)

## 2021-09-19 LAB — CBC WITH DIFFERENTIAL/PLATELET
Abs Immature Granulocytes: 0.01 10*3/uL (ref 0.00–0.07)
Basophils Absolute: 0.1 10*3/uL (ref 0.0–0.1)
Basophils Relative: 1 %
Eosinophils Absolute: 0.1 10*3/uL (ref 0.0–0.5)
Eosinophils Relative: 2 %
HCT: 41.7 % (ref 39.0–52.0)
Hemoglobin: 14.1 g/dL (ref 13.0–17.0)
Immature Granulocytes: 0 %
Lymphocytes Relative: 21 %
Lymphs Abs: 1.4 10*3/uL (ref 0.7–4.0)
MCH: 29.3 pg (ref 26.0–34.0)
MCHC: 33.8 g/dL (ref 30.0–36.0)
MCV: 86.5 fL (ref 80.0–100.0)
Monocytes Absolute: 0.7 10*3/uL (ref 0.1–1.0)
Monocytes Relative: 10 %
Neutro Abs: 4.6 10*3/uL (ref 1.7–7.7)
Neutrophils Relative %: 66 %
Platelets: 224 10*3/uL (ref 150–400)
RBC: 4.82 MIL/uL (ref 4.22–5.81)
RDW: 11.9 % (ref 11.5–15.5)
WBC: 6.8 10*3/uL (ref 4.0–10.5)
nRBC: 0 % (ref 0.0–0.2)

## 2021-09-19 LAB — COMPREHENSIVE METABOLIC PANEL
ALT: 23 U/L (ref 0–44)
AST: 20 U/L (ref 15–41)
Albumin: 3.7 g/dL (ref 3.5–5.0)
Alkaline Phosphatase: 126 U/L (ref 38–126)
Anion gap: 12 (ref 5–15)
BUN: 22 mg/dL — ABNORMAL HIGH (ref 6–20)
CO2: 26 mmol/L (ref 22–32)
Calcium: 9.3 mg/dL (ref 8.9–10.3)
Chloride: 89 mmol/L — ABNORMAL LOW (ref 98–111)
Creatinine, Ser: 1.26 mg/dL — ABNORMAL HIGH (ref 0.61–1.24)
GFR, Estimated: >60 mL/min (ref >60)
Glucose, Bld: 597 mg/dL (ref 70–99)
Potassium: 4.1 mmol/L (ref 3.5–5.1)
Sodium: 127 mmol/L — ABNORMAL LOW (ref 135–145)
Total Bilirubin: 0.7 mg/dL (ref 0.3–1.2)
Total Protein: 6.6 g/dL (ref 6.5–8.1)

## 2021-09-19 LAB — BASIC METABOLIC PANEL
Anion gap: 12 (ref 5–15)
BUN: 20 mg/dL (ref 6–20)
CO2: 26 mmol/L (ref 22–32)
Calcium: 9.3 mg/dL (ref 8.9–10.3)
Chloride: 89 mmol/L — ABNORMAL LOW (ref 98–111)
Creatinine, Ser: 1.25 mg/dL — ABNORMAL HIGH (ref 0.61–1.24)
GFR, Estimated: >60 mL/min (ref >60)
Glucose, Bld: 692 mg/dL (ref 70–99)
Potassium: 4.3 mmol/L (ref 3.5–5.1)
Sodium: 127 mmol/L — ABNORMAL LOW (ref 135–145)

## 2021-09-19 LAB — CBG MONITORING, ED
Glucose-Capillary: 568 mg/dL (ref 70–99)
Glucose-Capillary: 311 mg/dL — ABNORMAL HIGH (ref 70–99)

## 2021-09-19 LAB — I-STAT VENOUS BLOOD GAS, ED
Acid-Base Excess: 4 mmol/L — ABNORMAL HIGH (ref 0.0–2.0)
Bicarbonate: 29.1 mmol/L — ABNORMAL HIGH (ref 20.0–28.0)
Calcium, Ion: 1.18 mmol/L (ref 1.15–1.40)
HCT: 47 % (ref 39.0–52.0)
Hemoglobin: 16 g/dL (ref 13.0–17.0)
O2 Saturation: 73 %
Potassium: 4 mmol/L (ref 3.5–5.1)
Sodium: 129 mmol/L — ABNORMAL LOW (ref 135–145)
TCO2: 30 mmol/L (ref 22–32)
pCO2, Ven: 43.3 mmHg — ABNORMAL LOW (ref 44.0–60.0)
pH, Ven: 7.436 — ABNORMAL HIGH (ref 7.250–7.430)
pO2, Ven: 37 mmHg (ref 32.0–45.0)

## 2021-09-19 LAB — BRAIN NATRIURETIC PEPTIDE: B Natriuretic Peptide: 304 pg/mL — ABNORMAL HIGH (ref 0.0–100.0)

## 2021-09-19 MED ORDER — POTASSIUM CHLORIDE CRYS ER 20 MEQ PO TBCR: 20.0000 meq | EXTENDED_RELEASE_TABLET | Freq: Every day | ORAL | 3 refills | Status: DC

## 2021-09-19 MED ORDER — TORSEMIDE 20 MG PO TABS: ORAL_TABLET | ORAL | 3 refills | Status: DC

## 2021-09-19 MED ORDER — METFORMIN HCL 500 MG PO TABS
500.0000 mg | ORAL_TABLET | Freq: Once | ORAL | Status: AC
  Administered 2021-09-20: 500 mg via ORAL
  Filled 2021-09-19: qty 1

## 2021-09-19 MED ORDER — SPIRONOLACTONE 25 MG PO TABS: 25.0000 mg | ORAL_TABLET | Freq: Every day | ORAL | 3 refills | Status: DC

## 2021-09-19 MED ORDER — SODIUM CHLORIDE 0.9 % IV BOLUS
1000.0000 mL | Freq: Once | INTRAVENOUS | Status: AC
  Administered 2021-09-19: 1000 mL via INTRAVENOUS

## 2021-09-19 MED ORDER — SODIUM CHLORIDE 0.9 % IV BOLUS
500.0000 mL | Freq: Once | INTRAVENOUS | Status: AC
  Administered 2021-09-19: 500 mL via INTRAVENOUS

## 2021-09-19 MED ORDER — SODIUM CHLORIDE 0.9% FLUSH: 3.0000 mL | Freq: Two times a day (BID) | INTRAVENOUS | Status: DC

## 2021-09-19 MED ORDER — LOSARTAN POTASSIUM 25 MG PO TABS: 25.0000 mg | ORAL_TABLET | Freq: Every day | ORAL | 3 refills | Status: DC

## 2021-09-19 MED ORDER — METFORMIN HCL 500 MG PO TABS: 500.0000 mg | ORAL_TABLET | Freq: Two times a day (BID) | ORAL | 0 refills | Status: DC

## 2021-09-19 NOTE — ED Provider Notes (Signed)
Emergency Medicine Provider Triage Evaluation Note  Gabriel Floyd , a 40 y.o. male  was evaluated in triage.  Pt complains of hyperglycemia.  Patient took his blood sugar at home and was at 625.  He takes metformin for diabetes, is not on insulin.  Denies any missed doses.  Denies any symptoms at the moment..  Review of Systems  Positive: Hyperglycemia Negative: Syncope, headache, seizures, vomiting, body aches, chest pain, shortness of  Physical Exam  BP 102/75 (BP Location: Left Arm)   Pulse 76   Temp 98.9 F (37.2 C) (Oral)   Resp 18   SpO2 97%  Gen:   Awake, no distress   Resp:  Normal effort  MSK:   Moves extremities without difficulty  Other:    Medical Decision Making  Medically screening exam initiated at 5:21 PM.  Appropriate orders placed.  Gabriel Floyd was informed that the remainder of the evaluation will be completed by another provider, this initial triage assessment does not replace that evaluation, and the importance of remaining in the ED until their evaluation is complete.  Patient hyperglycemic to the 500s.  Will check labs for work-up for DKA.   Gabriel Arista, PA-C 09/19/21 1722    Gabriel Leiter, DO 09/21/21 1247

## 2021-09-19 NOTE — Progress Notes (Signed)
ReDS Vest / Clip - 09/19/21 1500       ReDS Vest / Clip   Station Marker D    Ruler Value 34    ReDS Value Range Moderate volume overload    ReDS Actual Value 40

## 2021-09-19 NOTE — Progress Notes (Signed)
[facility] Note   Referring Physician: PCP: Patient, [name] (Inactive) PCP-Cardiologist: Dr. Gala Romney  EP: Dr. Graciela Husbands   HPI:  Gabriel Floyd is a 40 y.o. 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 Admitted by EP. Had ECHO and cMRI elevated PA pressures. Referred to HF Team for further evaluation. Had RHC with elevated PA pressures and volume overload with PVR 17. Placed on milrinone to support RV + sildenafil. Underwent massive diuresis. Prior to d/c had repeat RHC with PVR down to 3.8 and optimized for HF. Plans to possibly add macitentan as an outpatient. Milrinone was gradaully weaned off.   Once optimized, EP placed ST Jude dual chamber CRT-D. On the day of discharge EKG showed A sensed V paced at 62 bpm with QRS 162.   Echo 5/22: EF 25-30% mild RV HK Mild septal flattening TR not significant to estimate RVSP Personally reviewed  He is here for f/u. Continues to feel great. 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 Wants to pursue cath. No SOB, orthopnea, PND or edema. Having cramsp   ICD interrogated in clinic: No VT. 99% Biv pacing (AS-VP 89%, AP-VP 10%). Volume ok. Activity level 4-6hr/day Personally reviewed   RHC 04/23/20  On milrinone 0.125 mcg/kg/min and sildenafil 40 tid  RA = 3 RV  =69/4 PA = 68/23 (41) PCW = 22 Fick cardiac output/index = 5.1/2.3 PVR = 3.8 WU Ao sat = 99% PA sat = 68%, 70%  Assessment: 1. Much improved hemodynamics 2. Now with moderate mixed PH   RHC on 04/18/20 RA = 15 prominent v-waves RV = 132/17 PA =  132/52 (77) PCW = 31 Fick cardiac output/index = 2.7/1.35 PVR = 17.3 WU FA sat = 99% PA sat = 49%, 51% SVC sat = 54%  Assessment: 1. Very severe mixed pulmonary HTN 2. Severely reduced CO in setting of cor pulmonale  3. No evidence of intracardiac shunting  2D Echo 08/17/20: EF 30-35% Grade 3DD  RV ok Very mild AS   Review of systems complete and found to be negative unless listed in HPI.     Past Medical History:  Diagnosis Date   CHF (congestive heart failure) (HCC)    Heart failure (HCC)    Pulmonary hyperinflation     Current Outpatient Medications  Medication Sig Dispense Refill   losartan (COZAAR) 25 MG tablet Take 1 tablet (25 mg total) by mouth at bedtime. 90 tablet 3   metFORMIN (GLUCOPHAGE) 500 MG tablet Take 500 mg by mouth 2 (two) times daily.     potassium chloride SA (KLOR-CON) 20 MEQ tablet Take 1 tablet (20 mEq total) by mouth daily. 30 tablet 11   sildenafil (REVATIO) 20 MG tablet Take 3 tablets (60 mg total) by mouth 3 (three) times daily. 180 tablet 6   spironolactone (ALDACTONE) 25 MG tablet Take 1 tablet (25 mg total)  by mouth daily. 90 tablet 3   torsemide (DEMADEX) 20 MG tablet Take 2 tablets (40 mg total) by mouth every morning AND 1 tablet (20 mg total) every evening. 90 tablet 11   No current facility-administered medications for this encounter.    No Known Allergies    Social History   Socioeconomic History   Marital status: Unknown    Spouse name: Not on file   Number of children: Not on file   Years of education: Not on file   Highest education level: Not on file  Occupational History   Not on file  Tobacco Use   Smoking status: Never   Smokeless tobacco: Never  Vaping Use   Vaping  Use: Never used  Substance and Sexual Activity   Alcohol use: No   Drug use: No   Sexual activity: Not on file  Other Topics Concern   Not on file  Social History Narrative   Not on file   Social Determinants of Health   Financial Resource Strain: Not on file  Food Insecurity: Not on file  Transportation Needs: Not on file  Physical Activity: Not on file  Stress: Not on file  Social Connections: Not on file  Intimate Partner Violence: Not on file      Family History  Problem Relation Age of Onset   Healthy Mother    Healthy Father     Vitals:   09/19/21 1421  BP: 98/64  Pulse: 81  SpO2: 97%  Weight: 98.7 kg (217 lb 9.6 oz)      PHYSICAL EXAM: General:  Well appearing. No resp difficulty HEENT: normal Neck: supple. JVP 8 Carotids 2+ bilat; no bruits. No lymphadenopathy or thryomegaly appreciated. Cor: PMI nondisplaced. Regular rate & rhythm. No rubs, gallops or murmurs. Lungs: clear Abdomen: soft, nontender, nondistended. No hepatosplenomegaly. No bruits or masses. Good bowel sounds. Extremities: no cyanosis, clubbing, rash, trace-1+ edema on left leg.  Neuro: alert & orientedx3, cranial nerves grossly intact. moves all 4 extremities w/o difficulty. Affect pleasant    ASSESSMENT & PLAN:  1. Chronic biventricular systolic HF - Echo 5/22: EF 25-30% mild RV HK Mild septal flattening TR not significant to estimate RVSP - Etiology remains unclear. There is no evidence of residual shunt or infiltrative process on cMRI/MRA or RHC. No scar - s/p STJ CRT-D in setting of CHB - NYHA I - Volume status looks slightly elevated ReDS 40%. Take extra torsemide as neded - Continue spiro 25 mg daily - Continue Farxiga 10mg  daily - Continue torsemide 40 daily - Continue Losartan 25 mg daily - BP too low to add b-blocker - Labs today.  - ICD interrogated personally in clinic. No VT. Volume up. Personally reviewed - R/L cath next week.  - Repeat echo at next visit   2.  Pulmonary HTN - Possible WHO Group I (congenital heart disease) +/-  II - Initial RHC 04/18/20 c/w very severe mixed pulmonary HTN, severely reduced CO in setting of cor pulmonale and no evidence of intracardiac shunting - Repeat RHC 04/23/20 on milrinone + sildenafil with much improved hemodynamics - V/Q scan 5/22 negative for PE  - Autoimmune serologies negative  - Doing well on Sildenafil 60 tid - NYHA Class I. Volume status slightyl elevated.  - R/L cath next week.   3. CHB - s/p CRT 5/22, followed by Dr. 6/22  - no change   4. Congenital heart disease - h/o aortic coarctation s/p surgical repair in 1987 in [place] (age  5) with residual thoracic aortic dialtion on imaging 12/18 - consider repeat imaging at next visit  5. DM2 - continue Farxiga & metformin    Gabriel Meres, MD 09/19/21

## 2021-09-19 NOTE — ED Triage Notes (Signed)
Pt reports blood sugars >600 at home. Currently taking metformin. Pt has no other complaints at this time, denies nausea/vomiting, excessive thirst or frequent urination.

## 2021-09-19 NOTE — Patient Instructions (Signed)
  Laboratorios realizados hoy, lo llamaremos si hay resultados anormales  Su mdico ha solicitado que se someta a un cateterismo cardaco. El cateterismo cardaco se [place] para diagnosticar y/o tratar diversas afecciones cardacas. Los mdicos pueden recomendar este procedimiento por varias [employer]. La razn ms comn es para Tree surgeon. El dolor de pecho puede ser un sntoma de la enfermedad de las arterias coronarias (CAD), y el cateterismo cardaco puede mostrar si la placa se est estrechando o bloqueando las arterias de su corazn. Este procedimiento tambin se [place] para evaluar las vlvulas, as como para medir el flujo sanguneo y los niveles de oxgeno en diferentes partes de su corazn. Para obtener ms informacin, visite [email or web address] Siga la hoja de instrucciones, tal como se indica.  Su mdico le recomienda Engineer, water cita de seguimiento en: 4 meses  Si tiene alguna pregunta o inquietud antes de su prxima cita, envenos un mensaje a travs de mychart o llame a nuestra oficina al [phone].  PARA DEJAR UN MENSAJE PARA LA ENFERMERA SELECCIONE LA OPCIN 2, POR FAVOR DEJE UN MENSAJE QUE INCLUYA:  SU NOMBRE  FECHA DE NACIMIENTO  NMERO DE DEVOLUCIN DE LLAMADA  MOTIVO DE LA LLAMADA**esto es importante ya que damos prioridad a las devoluciones de llamadas  RECIBIR UNA LLAMADA EL MISMO DA SIEMPRE QUE LLAME ANTES DE LAS 4:00 PM    INSTRUCCIONES DE CATETERIZACIN:  Tiene programado un cateterismo cardaco el viernes 28 de octubre con el Dr. Arvilla Meres.  1. Llegue a la Beckey Downing (Entrada principal A) en [facility]: [address] [place], [place] [address] a las 10:00 a. m. (Esta hora es dos horas antes de su procedimiento para garantizar su preparacin). El servicio de aparcacoches gratuito est disponible.  Nota especial: Se hace todo lo posible para que su procedimiento se realice a tiempo. Por favor comprenda que las  emergencias a veces retrasan los procedimientos programados.  2. Dieta: No coma alimentos slidos despus de la medianoche. El paciente puede tomar lquidos claros [employer] 5 am del da del procedimiento.  3. Laboratorios: HECHO HOY  4. Instrucciones de medicacin en preparacin para su procedimiento:   Viernes 28/10: NO TOMAR TORSEMIDA, METFORMINA, ESPIRONOLACTONA Sbado 29/10 Y Domingo 30/10: NO TOMAR METFORMINA  En la maana de su procedimiento, tome cualquier medicamento matutino que NO se encuentre en la lista anterior. Puede usar sorbos de Sports coach.  5. Planee una estada de una noche: traiga sus pertenencias personales. 6. Dorna Bloom lista actualizada de sus medicamentos y tarjetas de seguro vigentes. 7. DEBE tener una persona responsable que lo lleve a casa. 8. Alguien DEBE estar con usted las primeras 24 horas despus de llegar a casa o su alta se retrasar. 9. Use ropa que sea fcil de poner y [place] y use zapatos sin cordones.  Gracias por permitirnos cuidar de usted!   -- Servicios cardiovasculares invasivos de [employer]

## 2021-09-19 NOTE — Telephone Encounter (Signed)
Received call on critical lab results of glucose of 692. Spoke with patient who was advised to go to Emergency Room to be treated for the high glucose. Pt acknowledged and will report to ER.

## 2021-09-19 NOTE — Discharge Instructions (Signed)
You have been seen and discharged from the emergency department.  Your blood sugar was high because you have been without your metformin.  You were given your nighttime dose here in the emergency department.  Fill your prescription tomorrow and continue taking your medication as directed.  Follow-up with your primary provider for reevaluation and further care. Take home medications as prescribed. If you have any worsening symptoms or further concerns for your health please return to an emergency department for further evaluation.

## 2021-09-19 NOTE — ED Provider Notes (Signed)
[facility] EMERGENCY DEPARTMENT Provider Note   CSN: [id number] Arrival date & time: 09/19/21  1653     History Chief Complaint  Patient presents with   Hyperglycemia    Gabriel Floyd is a 40 y.o. male.  HPI  40 year old male with past medical history of HTN, CHF, DM on metformin presents emergency department concern for hyperglycemia.  Patient states has been without his metformin for 1 week.  Patient states his blood sugars have been in the 600s when he checks at home.  He has never required insulin before.  Denies any recent illness, fever.  No nausea/vomiting/diarrhea.  He has been trying to orally hydrate without improvement of his blood sugars.  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   Ventricular septal defect, left ventricle to right atrium 11/18/2017   Diabetes (HCC) 11/16/2017   Cardiomyopathy (HCC) 11/13/2017   CHF (congestive heart failure) (HCC) 11/13/2017   Diastolic heart failure (HCC) 11/13/2017   Chronic diastolic CHF (congestive heart failure) (HCC) 11/13/2017   Aortic insufficiency 11/13/2017   Edema 11/13/2017   Cardiomegaly 05/18/2017   Abnormal echocardiogram 05/18/2017    Past Surgical History:  Procedure Laterality Date   BIV ICD INSERTION CRT-D N/A 04/25/2020   Procedure: BIV ICD INSERTION CRT-D;  Surgeon: Duke Salvia, MD;  Location: [facility] INVASIVE CV LAB;  Service: Cardiovascular;  Laterality: N/A;   COARCTATION OF AORTA REPAIR     RIGHT HEART CATH N/A 04/18/2020   Procedure: RIGHT HEART  CATH;  Surgeon: Dolores Patty, MD;  Location: [facility] INVASIVE CV LAB;  Service: Cardiovascular;  Laterality: N/A;   RIGHT HEART CATH N/A 04/23/2020   Procedure: RIGHT HEART CATH;  Surgeon: Dolores Patty, MD;  Location: [facility] INVASIVE CV LAB;  Service: Cardiovascular;  Laterality: N/A;       Family History  Problem Relation Age of Onset   Healthy Mother    Healthy Father     Social History   Tobacco Use   Smoking status: Never   Smokeless tobacco: Never  Vaping Use   Vaping Use: Never used  Substance Use Topics   Alcohol use: No   Drug use: No    Home Medications Prior to Admission medications   Medication Sig Start Date End Date Taking? Authorizing Provider  losartan (COZAAR) 25 MG tablet Take 1 tablet (25 mg total) by mouth at bedtime. 09/19/21 12/18/21  Bensimhon, Bevelyn Buckles, MD  metFORMIN (GLUCOPHAGE) 500 MG tablet Take 500 mg by mouth 2 (two) times daily. 06/05/20   [provider]  potassium chloride SA (KLOR-CON) 20 MEQ tablet Take 1 tablet (20 mEq total) by mouth daily. 09/19/21   Bensimhon, Bevelyn Buckles, MD  sildenafil (REVATIO) 20 MG tablet Take 3 tablets (60 mg total) by mouth 3 (three) times daily. 05/30/21   Bensimhon, Bevelyn Buckles, MD  spironolactone (ALDACTONE) 25 MG tablet Take 1 tablet (25 mg total) by mouth daily. 09/19/21   Bensimhon, Bevelyn Buckles, MD  torsemide (DEMADEX) 20 MG tablet Take 2 tablets (40 mg total) by mouth every morning AND 1 tablet (20 mg  total) every evening. 09/19/21   Bensimhon, Bevelyn Buckles, MD    Allergies    Patient has no known allergies.  Review of Systems   Review of Systems  Constitutional:  Negative for chills and fever.  HENT:  Negative for congestion.   Respiratory:  Negative for shortness of breath.   Cardiovascular:  Negative for chest pain.  Gastrointestinal:  Negative for abdominal pain, diarrhea and vomiting.  Endocrine: Positive for polyphagia and polyuria.  Skin:  Negative for rash.  Neurological:  Negative for weakness,  numbness and headaches.   Physical Exam Updated Vital Signs BP 113/62   Pulse 79   Temp 98.9 F (37.2 C) (Oral)   Resp 18   SpO2 98%   Physical Exam Vitals and nursing note reviewed.  Constitutional:      Appearance: Normal appearance.  HENT:     Head: Normocephalic.     Mouth/Throat:     Mouth: Mucous membranes are moist.  Cardiovascular:     Rate and Rhythm: Normal rate.  Pulmonary:     Effort: Pulmonary effort is normal. No respiratory distress.  Abdominal:     Palpations: Abdomen is soft.     Tenderness: There is no abdominal tenderness.  Skin:    General: Skin is warm.  Neurological:     Mental Status: He is alert and oriented to person, place, and time. Mental status is at baseline.  Psychiatric:        Mood and Affect: Mood normal.    ED Results / Procedures / Treatments   Labs (all labs ordered are listed, but only abnormal results are displayed) Labs Reviewed  CBG MONITORING, ED - Abnormal; Notable for the following components:      Result Value   Glucose-Capillary 568 (*)    All other components within normal limits  I-STAT VENOUS BLOOD GAS, ED - Abnormal; Notable for the following components:   pH, Ven 7.436 (*)    pCO2, Ven 43.3 (*)    Bicarbonate 29.1 (*)    Acid-Base Excess 4.0 (*)    Sodium 129 (*)    All other components within normal limits  CBC WITH DIFFERENTIAL/PLATELET  URINALYSIS, ROUTINE W REFLEX MICROSCOPIC  COMPREHENSIVE METABOLIC PANEL    EKG None  Radiology No results found.  Procedures Procedures   Medications Ordered in ED Medications  sodium chloride 0.9 % bolus 1,000 mL (has no administration in time range)    ED Course  I have reviewed the triage vital signs and the nursing notes.  Pertinent labs & imaging results that were available during my care of the patient were reviewed by me and considered in my medical decision making (see chart for details).    MDM Rules/Calculators/A&P                            Patient is primarily Spanish-speaking, interpreter services used.  40 year old male presents the emergency department with concern for hyperglycemia, has been out of his metformin for 1 week.  Denies any fever or acute illness.  Vitals are stable here.  Blood work shows hyperglycemia without findings of HHS/DKA.  Patient was given IV fluids and allowed to orally rehydrate.  Blood sugars appropriately reduced into the 300s.  Patient was given his nighttime dose of metformin and a prescription has been sent.  Patient understands the importance of compliance with metformin and has outpatient follow-up.  Patient at this time appears safe and stable for discharge  and will be treated as an outpatient.  Discharge plan and strict return to ED precautions discussed, patient verbalizes understanding and agreement.  Final Clinical Impression(s) / ED Diagnoses Final diagnoses:  None    Rx / DC Orders ED Discharge Orders     None        Rozelle Logan, DO 09/19/21 2350

## 2021-09-19 NOTE — H&P (View-Only) (Signed)
Advanced Heart Failure Clinic Note   Referring Physician: PCP: Patient, [name] (Inactive) PCP-Cardiologist: Dr. Karrah Mangini  EP: Dr. Klein   HPI:  Gabriel Floyd is a 40 y.o. 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 Admitted by EP. Had ECHO and cMRI elevated PA pressures. Referred to HF Team for further evaluation. Had RHC with elevated PA pressures and volume overload with PVR 17. Placed on milrinone to support RV + sildenafil. Underwent massive diuresis. Prior to d/c had repeat RHC with PVR down to 3.8 and optimized for HF. Plans to possibly add macitentan as an outpatient. Milrinone was gradaully weaned off.   Once optimized, EP placed ST Jude dual chamber CRT-D. On the day of discharge EKG showed A sensed V paced at 62 bpm with QRS 162.   Echo 5/22: EF 25-30% mild RV HK Mild septal flattening TR not significant to estimate RVSP Personally reviewed  He is here for f/u. Continues to feel great. Working construction and painting. At last visit we discussed need for repeat RHC (+ coronary angiogram). Wants to pursue cath. No SOB, orthopnea, PND or edema. Having cramsp   ICD interrogated in clinic: No VT. 99% Biv pacing (AS-VP 89%, AP-VP 10%). Volume ok. Activity level 4-6hr/day Personally reviewed   RHC 04/23/20  On milrinone 0.125 mcg/kg/min and sildenafil 40 tid  RA = 3 RV  =69/4 PA = 68/23 (41) PCW = 22 Fick cardiac output/index = 5.1/2.3 PVR = 3.8 WU Ao sat = 99% PA sat = 68%, 70%  Assessment: 1. Much improved hemodynamics 2. Now with moderate mixed PH   RHC on 04/18/20 RA = 15 prominent v-waves RV = 132/17 PA =  132/52 (77) PCW = 31 Fick cardiac output/index = 2.7/1.35 PVR = 17.3 WU FA sat = 99% PA sat = 49%, 51% SVC sat = 54%  Assessment: 1. Very severe mixed pulmonary HTN 2. Severely reduced CO in setting of cor pulmonale  3. No evidence of intracardiac shunting  2D Echo 08/17/20: EF 30-35% Grade 3DD  RV ok Very mild AS   Review of systems complete and found to be negative unless listed in HPI.     Past Medical History:  Diagnosis Date   CHF (congestive heart failure) (HCC)    Heart failure (HCC)    Pulmonary hyperinflation     Current Outpatient Medications  Medication Sig Dispense Refill   losartan (COZAAR) 25 MG tablet Take 1 tablet (25 mg total) by mouth at bedtime. 90 tablet 3   metFORMIN (GLUCOPHAGE) 500 MG tablet Take 500 mg by mouth 2 (two) times daily.     potassium chloride SA (KLOR-CON) 20 MEQ tablet Take 1 tablet (20 mEq total) by mouth daily. 30 tablet 11   sildenafil (REVATIO) 20 MG tablet Take 3 tablets (60 mg total) by mouth 3 (three) times daily. 180 tablet 6   spironolactone (ALDACTONE) 25 MG tablet Take 1 tablet (25 mg total)   by mouth daily. 90 tablet 3   torsemide (DEMADEX) 20 MG tablet Take 2 tablets (40 mg total) by mouth every morning AND 1 tablet (20 mg total) every evening. 90 tablet 11   No current facility-administered medications for this encounter.    No Known Allergies    Social History   Socioeconomic History   Marital status: Unknown    Spouse name: Not on file   Number of children: Not on file   Years of education: Not on file   Highest education level: Not on file  Occupational History   Not on file  Tobacco Use   Smoking status: Never   Smokeless tobacco: Never  Vaping Use   Vaping  Use: Never used  Substance and Sexual Activity   Alcohol use: No   Drug use: No   Sexual activity: Not on file  Other Topics Concern   Not on file  Social History Narrative   Not on file   Social Determinants of Health   Financial Resource Strain: Not on file  Food Insecurity: Not on file  Transportation Needs: Not on file  Physical Activity: Not on file  Stress: Not on file  Social Connections: Not on file  Intimate Partner Violence: Not on file      Family History  Problem Relation Age of Onset   Healthy Mother    Healthy Father     Vitals:   09/19/21 1421  BP: 98/64  Pulse: 81  SpO2: 97%  Weight: 98.7 kg (217 lb 9.6 oz)      PHYSICAL EXAM: General:  Well appearing. No resp difficulty HEENT: normal Neck: supple. JVP 8 Carotids 2+ bilat; no bruits. No lymphadenopathy or thryomegaly appreciated. Cor: PMI nondisplaced. Regular rate & rhythm. No rubs, gallops or murmurs. Lungs: clear Abdomen: soft, nontender, nondistended. No hepatosplenomegaly. No bruits or masses. Good bowel sounds. Extremities: no cyanosis, clubbing, rash, trace-1+ edema on left leg.  Neuro: alert & orientedx3, cranial nerves grossly intact. moves all 4 extremities w/o difficulty. Affect pleasant    ASSESSMENT & PLAN:  1. Chronic biventricular systolic HF - Echo 5/22: EF 25-30% mild RV HK Mild septal flattening TR not significant to estimate RVSP - Etiology remains unclear. There is no evidence of residual shunt or infiltrative process on cMRI/MRA or RHC. No scar - s/p STJ CRT-D in setting of CHB - NYHA I - Volume status looks slightly elevated ReDS 40%. Take extra torsemide as neded - Continue spiro 25 mg daily - Continue Farxiga 10mg daily - Continue torsemide 40 daily - Continue Losartan 25 mg daily - BP too low to add b-blocker - Labs today.  - ICD interrogated personally in clinic. No VT. Volume up. Personally reviewed - R/L cath next week.  - Repeat echo at next visit   2.  Pulmonary HTN - Possible WHO Group I (congenital heart disease) +/-  II - Initial RHC 04/18/20 c/w very severe mixed pulmonary HTN, severely reduced CO in setting of cor pulmonale and no evidence of intracardiac shunting - Repeat RHC 04/23/20 on milrinone + sildenafil with much improved hemodynamics - V/Q scan 5/22 negative for PE  - Autoimmune serologies negative  - Doing well on Sildenafil 60 tid - NYHA Class I. Volume status slightyl elevated.  - R/L cath next week.   3. CHB - s/p CRT 5/22, followed by Dr. Klein  - no change   4. Congenital heart disease - h/o aortic coarctation s/p surgical repair in 1987 in [place] (age   3) with residual thoracic aortic dialtion on imaging 12/18 - consider repeat imaging at next visit  5. DM2 - continue Farxiga & metformin    Arvilla Meres, MD 09/19/21

## 2021-09-24 ENCOUNTER — Ambulatory Visit (INDEPENDENT_AMBULATORY_CARE_PROVIDER_SITE_OTHER): Payer: Self-pay

## 2021-09-24 DIAGNOSIS — I429 Cardiomyopathy, unspecified: Secondary | ICD-10-CM

## 2021-09-25 LAB — CUP PACEART REMOTE DEVICE CHECK
Battery Remaining Longevity: 64 mo
Battery Remaining Percentage: 77 %
Battery Voltage: 2.95 V
Brady Statistic AP VP Percent: 7.5 %
Brady Statistic AP VS Percent: 1 %
Brady Statistic AS VP Percent: 92 %
Brady Statistic AS VS Percent: 1 %
Brady Statistic RA Percent Paced: 7.3 %
Date Time Interrogation Session: 20221026102041
HighPow Impedance: 77 Ohm
Implantable Lead Implant Date: 20210526
Implantable Lead Implant Date: 20210526
Implantable Lead Implant Date: 20210526
Implantable Lead Location: 753858
Implantable Lead Location: 753859
Implantable Lead Location: 753860
Implantable Pulse Generator Implant Date: 20210526
Lead Channel Impedance Value: 450 Ohm
Lead Channel Impedance Value: 510 Ohm
Lead Channel Impedance Value: 590 Ohm
Lead Channel Pacing Threshold Amplitude: 0.625 V
Lead Channel Pacing Threshold Amplitude: 0.75 V
Lead Channel Pacing Threshold Amplitude: 1.75 V
Lead Channel Pacing Threshold Pulse Width: 0.5 ms
Lead Channel Pacing Threshold Pulse Width: 0.5 ms
Lead Channel Pacing Threshold Pulse Width: 1 ms
Lead Channel Sensing Intrinsic Amplitude: 12 mV
Lead Channel Sensing Intrinsic Amplitude: 5 mV
Lead Channel Setting Pacing Amplitude: 1.625
Lead Channel Setting Pacing Amplitude: 2.5 V
Lead Channel Setting Pacing Amplitude: 2.5 V
Lead Channel Setting Pacing Pulse Width: 0.5 ms
Lead Channel Setting Pacing Pulse Width: 1 ms
Lead Channel Setting Sensing Sensitivity: 0.5 mV
Pulse Gen Serial Number: 111023761

## 2021-09-27 ENCOUNTER — Other Ambulatory Visit: Payer: Self-pay

## 2021-09-27 ENCOUNTER — Encounter (HOSPITAL_COMMUNITY)
Admission: RE | Disposition: A | Discharge status: Home / Self Care | Payer: Self-pay | Source: Home / Self Care | Attending: Internal Medicine

## 2021-09-27 ENCOUNTER — Ambulatory Visit (HOSPITAL_COMMUNITY)
Admission: RE | Admit: 2021-09-27 | Discharge: 2021-09-27 | Disposition: A | Discharge status: Home / Self Care | Payer: Self-pay | Attending: Internal Medicine | Admitting: Internal Medicine

## 2021-09-27 DIAGNOSIS — Q251 Coarctation of aorta: Secondary | ICD-10-CM | POA: Insufficient documentation

## 2021-09-27 DIAGNOSIS — I5042 Chronic combined systolic (congestive) and diastolic (congestive) heart failure: Secondary | ICD-10-CM | POA: Insufficient documentation

## 2021-09-27 DIAGNOSIS — Z79899 Other long term (current) drug therapy: Secondary | ICD-10-CM | POA: Insufficient documentation

## 2021-09-27 DIAGNOSIS — Z95810 Presence of automatic (implantable) cardiac defibrillator: Secondary | ICD-10-CM | POA: Insufficient documentation

## 2021-09-27 DIAGNOSIS — I5022 Chronic systolic (congestive) heart failure: Secondary | ICD-10-CM

## 2021-09-27 DIAGNOSIS — I5032 Chronic diastolic (congestive) heart failure: Secondary | ICD-10-CM

## 2021-09-27 DIAGNOSIS — I2721 Secondary pulmonary arterial hypertension: Secondary | ICD-10-CM | POA: Insufficient documentation

## 2021-09-27 DIAGNOSIS — I428 Other cardiomyopathies: Secondary | ICD-10-CM | POA: Insufficient documentation

## 2021-09-27 DIAGNOSIS — Z7984 Long term (current) use of oral hypoglycemic drugs: Secondary | ICD-10-CM | POA: Insufficient documentation

## 2021-09-27 DIAGNOSIS — I442 Atrioventricular block, complete: Secondary | ICD-10-CM | POA: Insufficient documentation

## 2021-09-27 DIAGNOSIS — Z8774 Personal history of (corrected) congenital malformations of heart and circulatory system: Secondary | ICD-10-CM | POA: Insufficient documentation

## 2021-09-27 DIAGNOSIS — E119 Type 2 diabetes mellitus without complications: Secondary | ICD-10-CM | POA: Insufficient documentation

## 2021-09-27 HISTORY — PX: RIGHT/LEFT HEART CATH AND CORONARY ANGIOGRAPHY: CATH118266

## 2021-09-27 LAB — POCT I-STAT EG7
Acid-Base Excess: 2 mmol/L (ref 0.0–2.0)
Acid-Base Excess: 5 mmol/L — ABNORMAL HIGH (ref 0.0–2.0)
Acid-Base Excess: 5 mmol/L — ABNORMAL HIGH (ref 0.0–2.0)
Acid-Base Excess: 5 mmol/L — ABNORMAL HIGH (ref 0.0–2.0)
Acid-base deficit: 1 mmol/L (ref 0.0–2.0)
Bicarbonate: 28.3 mmol/L — ABNORMAL HIGH (ref 20.0–28.0)
Bicarbonate: 31.8 mmol/L — ABNORMAL HIGH (ref 20.0–28.0)
Bicarbonate: 25.8 mmol/L (ref 20.0–28.0)
Bicarbonate: 31.2 mmol/L — ABNORMAL HIGH (ref 20.0–28.0)
Bicarbonate: 31.2 mmol/L — ABNORMAL HIGH (ref 20.0–28.0)
Calcium, Ion: 1.15 mmol/L (ref 1.15–1.40)
Calcium, Ion: 1.26 mmol/L (ref 1.15–1.40)
Calcium, Ion: 0.98 mmol/L — ABNORMAL LOW (ref 1.15–1.40)
Calcium, Ion: 1.27 mmol/L (ref 1.15–1.40)
Calcium, Ion: 1.29 mmol/L (ref 1.15–1.40)
HCT: 45 % (ref 39.0–52.0)
HCT: 48 % (ref 39.0–52.0)
HCT: 43 % (ref 39.0–52.0)
HCT: 47 % (ref 39.0–52.0)
HCT: 47 % (ref 39.0–52.0)
Hemoglobin: 15.3 g/dL (ref 13.0–17.0)
Hemoglobin: 16.3 g/dL (ref 13.0–17.0)
Hemoglobin: 14.6 g/dL (ref 13.0–17.0)
Hemoglobin: 16 g/dL (ref 13.0–17.0)
Hemoglobin: 16 g/dL (ref 13.0–17.0)
O2 Saturation: 68 %
O2 Saturation: 75 %
O2 Saturation: 66 %
O2 Saturation: 70 %
O2 Saturation: 72 %
Potassium: 3.2 mmol/L — ABNORMAL LOW (ref 3.5–5.1)
Potassium: 3.7 mmol/L (ref 3.5–5.1)
Potassium: 2.8 mmol/L — ABNORMAL LOW (ref 3.5–5.1)
Potassium: 3.5 mmol/L (ref 3.5–5.1)
Potassium: 3.5 mmol/L (ref 3.5–5.1)
Sodium: 145 mmol/L (ref 135–145)
Sodium: 149 mmol/L — ABNORMAL HIGH (ref 135–145)
Sodium: 137 mmol/L (ref 135–145)
Sodium: 144 mmol/L (ref 135–145)
Sodium: 144 mmol/L (ref 135–145)
TCO2: 30 mmol/L (ref 22–32)
TCO2: 33 mmol/L — ABNORMAL HIGH (ref 22–32)
TCO2: 27 mmol/L (ref 22–32)
TCO2: 33 mmol/L — ABNORMAL HIGH (ref 22–32)
TCO2: 33 mmol/L — ABNORMAL HIGH (ref 22–32)
pCO2, Ven: 50.1 mmHg (ref 44.0–60.0)
pCO2, Ven: 55.4 mmHg (ref 44.0–60.0)
pCO2, Ven: 50.3 mmHg (ref 44.0–60.0)
pCO2, Ven: 51.4 mmHg (ref 44.0–60.0)
pCO2, Ven: 52 mmHg (ref 44.0–60.0)
pH, Ven: 7.361 (ref 7.250–7.430)
pH, Ven: 7.366 (ref 7.250–7.430)
pH, Ven: 7.318 (ref 7.250–7.430)
pH, Ven: 7.386 (ref 7.250–7.430)
pH, Ven: 7.391 (ref 7.250–7.430)
pO2, Ven: 38 mmHg (ref 32.0–45.0)
pO2, Ven: 42 mmHg (ref 32.0–45.0)
pO2, Ven: 37 mmHg (ref 32.0–45.0)
pO2, Ven: 37 mmHg (ref 32.0–45.0)
pO2, Ven: 39 mmHg (ref 32.0–45.0)

## 2021-09-27 LAB — POCT I-STAT 7, (LYTES, BLD GAS, ICA,H+H)
Acid-Base Excess: 5 mmol/L — ABNORMAL HIGH (ref 0.0–2.0)
Bicarbonate: 31.1 mmol/L — ABNORMAL HIGH (ref 20.0–28.0)
Calcium, Ion: 1.25 mmol/L (ref 1.15–1.40)
HCT: 48 % (ref 39.0–52.0)
Hemoglobin: 16.3 g/dL (ref 13.0–17.0)
O2 Saturation: 99 %
Potassium: 3.7 mmol/L (ref 3.5–5.1)
Sodium: 143 mmol/L (ref 135–145)
TCO2: 33 mmol/L — ABNORMAL HIGH (ref 22–32)
pCO2 arterial: 50 mmHg — ABNORMAL HIGH (ref 32.0–48.0)
pH, Arterial: 7.402 (ref 7.350–7.450)
pO2, Arterial: 117 mmHg — ABNORMAL HIGH (ref 83.0–108.0)

## 2021-09-27 LAB — POCT I-STAT, CHEM 8
BUN: 28 mg/dL — ABNORMAL HIGH (ref 6–20)
Calcium, Ion: 1.28 mmol/L (ref 1.15–1.40)
Chloride: 102 mmol/L (ref 98–111)
Creatinine, Ser: 1 mg/dL (ref 0.61–1.24)
Glucose, Bld: 342 mg/dL — ABNORMAL HIGH (ref 70–99)
HCT: 47 % (ref 39.0–52.0)
Hemoglobin: 16 g/dL (ref 13.0–17.0)
Potassium: 4 mmol/L (ref 3.5–5.1)
Sodium: 142 mmol/L (ref 135–145)
TCO2: 30 mmol/L (ref 22–32)

## 2021-09-27 LAB — BASIC METABOLIC PANEL
Anion gap: 12 (ref 5–15)
BUN: 23 mg/dL — ABNORMAL HIGH (ref 6–20)
CO2: 26 mmol/L (ref 22–32)
Calcium: 10 mg/dL (ref 8.9–10.3)
Chloride: 98 mmol/L (ref 98–111)
Creatinine, Ser: 1.16 mg/dL (ref 0.61–1.24)
GFR, Estimated: >60 mL/min (ref >60)
Glucose, Bld: 436 mg/dL — ABNORMAL HIGH (ref 70–99)
Potassium: 4.1 mmol/L (ref 3.5–5.1)
Sodium: 136 mmol/L (ref 135–145)

## 2021-09-27 LAB — GLUCOSE, CAPILLARY: Glucose-Capillary: 367 mg/dL — ABNORMAL HIGH (ref 70–99)

## 2021-09-27 LAB — CARDIAC CATHETERIZATION: Cath EF Quantitative: 25 %

## 2021-09-27 SURGERY — RIGHT/LEFT HEART CATH AND CORONARY ANGIOGRAPHY
Anesthesia: LOCAL

## 2021-09-27 MED ORDER — MIDAZOLAM HCL 2 MG/2ML IJ SOLN
INTRAMUSCULAR | Status: AC
  Filled 2021-09-27: qty 2

## 2021-09-27 MED ORDER — HEPARIN SODIUM (PORCINE) 1000 UNIT/ML IJ SOLN
INTRAMUSCULAR | Status: AC
  Filled 2021-09-27: qty 1

## 2021-09-27 MED ORDER — FENTANYL CITRATE (PF) 100 MCG/2ML IJ SOLN
INTRAMUSCULAR | Status: DC | PRN
  Administered 2021-09-27: 25 ug via INTRAVENOUS
  Administered 2021-09-27: 50 ug via INTRAVENOUS
  Administered 2021-09-27: 25 ug via INTRAVENOUS

## 2021-09-27 MED ORDER — SODIUM CHLORIDE 0.9 % IV SOLN: INTRAVENOUS | Status: DC

## 2021-09-27 MED ORDER — SODIUM CHLORIDE 0.9% FLUSH: 3.0000 mL | INTRAVENOUS | Status: DC | PRN

## 2021-09-27 MED ORDER — METHOCARBAMOL 1000 MG/10ML IJ SOLN
500.0000 mg | INTRAVENOUS | Status: DC
  Filled 2021-09-27: qty 5

## 2021-09-27 MED ORDER — INSULIN ASPART 100 UNIT/ML IJ SOLN
INTRAMUSCULAR | Status: AC
  Administered 2021-09-27: 20 [IU] via SUBCUTANEOUS
  Filled 2021-09-27: qty 1

## 2021-09-27 MED ORDER — LABETALOL HCL 5 MG/ML IV SOLN: 10.0000 mg | INTRAVENOUS | Status: DC | PRN

## 2021-09-27 MED ORDER — VERAPAMIL HCL 2.5 MG/ML IV SOLN
INTRAVENOUS | Status: DC | PRN
  Administered 2021-09-27 (×2): 10 mL via INTRA_ARTERIAL

## 2021-09-27 MED ORDER — LIDOCAINE HCL (PF) 1 % IJ SOLN
INTRAMUSCULAR | Status: AC
  Filled 2021-09-27: qty 30

## 2021-09-27 MED ORDER — HYDRALAZINE HCL 20 MG/ML IJ SOLN: 10.0000 mg | INTRAMUSCULAR | Status: DC | PRN

## 2021-09-27 MED ORDER — LIDOCAINE HCL (PF) 1 % IJ SOLN
INTRAMUSCULAR | Status: DC | PRN
  Administered 2021-09-27: 2 mL
  Administered 2021-09-27: 20 mL
  Administered 2021-09-27 (×2): 2 mL

## 2021-09-27 MED ORDER — HEPARIN (PORCINE) IN NACL 1000-0.9 UT/500ML-% IV SOLN
INTRAVENOUS | Status: AC
  Filled 2021-09-27: qty 500

## 2021-09-27 MED ORDER — SODIUM CHLORIDE 0.9 % IV SOLN: 250.0000 mL | INTRAVENOUS | Status: DC | PRN

## 2021-09-27 MED ORDER — ACETAMINOPHEN 325 MG PO TABS: 650.0000 mg | ORAL_TABLET | ORAL | Status: DC | PRN

## 2021-09-27 MED ORDER — IOHEXOL 350 MG/ML SOLN
INTRAVENOUS | Status: DC | PRN
  Administered 2021-09-27: 75 mL via INTRA_ARTERIAL

## 2021-09-27 MED ORDER — HEPARIN (PORCINE) IN NACL 1000-0.9 UT/500ML-% IV SOLN
INTRAVENOUS | Status: DC | PRN
  Administered 2021-09-27 (×2): 500 mL

## 2021-09-27 MED ORDER — HEPARIN SODIUM (PORCINE) 1000 UNIT/ML IJ SOLN
INTRAMUSCULAR | Status: DC | PRN
  Administered 2021-09-27: 5000 [IU] via INTRAVENOUS

## 2021-09-27 MED ORDER — INSULIN ASPART 100 UNIT/ML IJ SOLN
15.0000 [IU] | Freq: Once | INTRAMUSCULAR | Status: AC
  Administered 2021-09-27: 15 [IU] via SUBCUTANEOUS
  Filled 2021-09-27: qty 0.15

## 2021-09-27 MED ORDER — MIDAZOLAM HCL 2 MG/2ML IJ SOLN
INTRAMUSCULAR | Status: DC | PRN
  Administered 2021-09-27: 1 mg via INTRAVENOUS
  Administered 2021-09-27: 2 mg via INTRAVENOUS
  Administered 2021-09-27: 1 mg via INTRAVENOUS

## 2021-09-27 MED ORDER — FENTANYL CITRATE (PF) 100 MCG/2ML IJ SOLN
INTRAMUSCULAR | Status: AC
  Filled 2021-09-27: qty 2

## 2021-09-27 MED ORDER — ONDANSETRON HCL 4 MG/2ML IJ SOLN: 4.0000 mg | Freq: Four times a day (QID) | INTRAMUSCULAR | Status: DC | PRN

## 2021-09-27 MED ORDER — INSULIN ASPART 100 UNIT/ML IJ SOLN
INTRAMUSCULAR | Status: AC
  Filled 2021-09-27: qty 1

## 2021-09-27 MED ORDER — INSULIN ASPART 100 UNIT/ML IJ SOLN: 20.0000 [IU] | Freq: Once | INTRAMUSCULAR | Status: AC

## 2021-09-27 MED ORDER — SODIUM CHLORIDE 0.9% FLUSH: 3.0000 mL | Freq: Two times a day (BID) | INTRAVENOUS | Status: DC

## 2021-09-27 MED ORDER — ASPIRIN 81 MG PO CHEW: 81.0000 mg | CHEWABLE_TABLET | ORAL | Status: DC

## 2021-09-27 MED ORDER — VERAPAMIL HCL 2.5 MG/ML IV SOLN
INTRAVENOUS | Status: AC
  Filled 2021-09-27: qty 2

## 2021-09-27 SURGICAL SUPPLY — 18 items
CATH 5FR JL3.5 JR4 ANG PIG MP (CATHETERS) ×2 IMPLANT
CATH BALLN WEDGE 5F 110CM (CATHETERS) ×2 IMPLANT
CATH INFINITI 5 FR 3DRC (CATHETERS) ×2 IMPLANT
CATH INFINITI 5FR JL4 (CATHETERS) ×2 IMPLANT
CLOSURE MYNX CONTROL 5F (Vascular Products) ×2 IMPLANT
DEVICE RAD COMP TR BAND LRG (VASCULAR PRODUCTS) ×4 IMPLANT
GLIDESHEATH SLEND SS 6F .021 (SHEATH) ×4 IMPLANT
GUIDEWIRE .025 260CM (WIRE) ×2 IMPLANT
GUIDEWIRE INQWIRE 1.5J.035X260 (WIRE) ×1 IMPLANT
INQWIRE 1.5J .035X260CM (WIRE) ×2
KIT HEART LEFT (KITS) ×2 IMPLANT
PACK CARDIAC CATHETERIZATION (CUSTOM PROCEDURE TRAY) ×2 IMPLANT
SHEATH GLIDE SLENDER 4/5FR (SHEATH) ×2 IMPLANT
SHEATH PINNACLE 5F 10CM (SHEATH) ×2 IMPLANT
TRANSDUCER W/STOPCOCK (MISCELLANEOUS) ×2 IMPLANT
WIRE EMERALD 3MM-J .035X150CM (WIRE) ×2 IMPLANT
WIRE EMERALD 3MM-J .035X260CM (WIRE) ×2 IMPLANT
WIRE HI TORQ VERSACORE-J 145CM (WIRE) ×2 IMPLANT

## 2021-09-27 NOTE — Progress Notes (Signed)
  Patient's access sites are stable. Feels good. Being discharged home.   I spoke with him and his family at bedside.   Arvilla Meres, MD  6:57 PM

## 2021-09-27 NOTE — Progress Notes (Signed)
Bensimhon, MD called back and stated that the patient will be going home and that he will speak with the family. MD instructed this RN to not have contact with the family and that he will discuss it with them. This RN verbalized understanding.

## 2021-09-27 NOTE — Progress Notes (Signed)
I spoke with patient's family by telephone and reassured them that if his bedrest is uneventful that he will be suitable for discharge. They were in agreement and happy to pick him up when he was ready.  I told them that they could call me back if they had any questions.  Patient currently does not meet criteria for post-procedure admission.  Arvilla Meres, MD  4:24 PM

## 2021-09-27 NOTE — Progress Notes (Signed)
Lab called about results of Bmet states the blood is still running.

## 2021-09-27 NOTE — Progress Notes (Signed)
Dr Gala Romney called and informed of CBg new orders noted

## 2021-09-27 NOTE — Progress Notes (Signed)
Patient with bilateral TR Bands and a R groin. Patients family unable to care for patient. Attempted to page Bensimhon. Mardella Layman, [place] paged and made aware of situation. PA stated that they would come by to evaluate patient.

## 2021-09-27 NOTE — Interval H&P Note (Signed)
History and Physical Interval Note:  09/27/2021 9:52 AM  Gabriel Floyd  has presented today for surgery, with the diagnosis of heart failure and PAH.  The various methods of treatment have been discussed with the patient and family. After consideration of risks, benefits and other options for treatment, the patient has consented to  Procedure(s): RIGHT/LEFT HEART CATH AND CORONARY ANGIOGRAPHY (N/A) and possible coronary angiography as a surgical intervention.  The patient's history has been reviewed, patient examined, no change in status, stable for surgery.  I have reviewed the patient's chart and labs.  Questions were answered to the patient's satisfaction.     Estefani Bateson

## 2021-09-30 ENCOUNTER — Encounter (HOSPITAL_COMMUNITY): Payer: Self-pay | Admitting: Internal Medicine

## 2021-10-02 NOTE — Progress Notes (Signed)
Remote ICD transmission.   

## 2021-12-24 ENCOUNTER — Ambulatory Visit (INDEPENDENT_AMBULATORY_CARE_PROVIDER_SITE_OTHER): Payer: Self-pay

## 2021-12-24 DIAGNOSIS — I429 Cardiomyopathy, unspecified: Secondary | ICD-10-CM

## 2021-12-25 LAB — CUP PACEART REMOTE DEVICE CHECK
Battery Remaining Longevity: 59 mo
Battery Remaining Percentage: 73 %
Battery Voltage: 2.95 V
Brady Statistic AP VP Percent: 8.8 %
Brady Statistic AP VS Percent: 1 %
Brady Statistic AS VP Percent: 90 %
Brady Statistic AS VS Percent: 1 %
Brady Statistic RA Percent Paced: 8.6 %
Date Time Interrogation Session: 20230124201905
HighPow Impedance: 86 Ohm
Implantable Lead Implant Date: 20210526
Implantable Lead Implant Date: 20210526
Implantable Lead Implant Date: 20210526
Implantable Lead Location: 753858
Implantable Lead Location: 753859
Implantable Lead Location: 753860
Implantable Pulse Generator Implant Date: 20210526
Lead Channel Impedance Value: 430 Ohm
Lead Channel Impedance Value: 500 Ohm
Lead Channel Impedance Value: 560 Ohm
Lead Channel Pacing Threshold Amplitude: 0.625 V
Lead Channel Pacing Threshold Amplitude: 0.75 V
Lead Channel Pacing Threshold Amplitude: 1.75 V
Lead Channel Pacing Threshold Pulse Width: 0.5 ms
Lead Channel Pacing Threshold Pulse Width: 0.5 ms
Lead Channel Pacing Threshold Pulse Width: 1 ms
Lead Channel Sensing Intrinsic Amplitude: 12 mV
Lead Channel Sensing Intrinsic Amplitude: 5 mV
Lead Channel Setting Pacing Amplitude: 1.625
Lead Channel Setting Pacing Amplitude: 2.5 V
Lead Channel Setting Pacing Amplitude: 2.5 V
Lead Channel Setting Pacing Pulse Width: 0.5 ms
Lead Channel Setting Pacing Pulse Width: 1 ms
Lead Channel Setting Sensing Sensitivity: 0.5 mV
Pulse Gen Serial Number: 111023761

## 2022-01-03 NOTE — Progress Notes (Signed)
Remote ICD transmission.   

## 2022-01-19 NOTE — Progress Notes (Signed)
Patient did not show for appt. Note left for templating purposes only.      Advanced Heart Failure Clinic Note   Referring Physician: PCP: Patient, [name] (Inactive) PCP-Cardiologist: Dr. Gala Romney  EP: Dr. Graciela Husbands   HPI:  Gabriel Floyd is a 41 y.o. 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 Admitted by EP. Had ECHO and cMRI elevated PA pressures. Referred to HF Team for further evaluation. Had RHC with elevated PA pressures and volume overload with PVR 17. Placed on milrinone to support RV + sildenafil. Underwent massive diuresis. Prior to d/c had repeat RHC with PVR down to 3.8 and optimized for HF. Plans to possibly add macitentan as an outpatient. Milrinone was gradaully weaned off.   Once optimized, EP placed ST Jude dual chamber CRT-D. On the day of discharge EKG showed A sensed V paced at 62 bpm with QRS 162.   Echo 5/22: EF 25-30% mild RV HK Mild septal flattening TR not significant to estimate RVSP Personally reviewed  R/L Cath 10/22: Normal coronaries EF 25%. Severe narrowing of R brachial artery prohibiting R radial approach. Dilated thoracic aorta with inability to access ascending aorta from left radial approach  Ao =  99/58 (73) LV = 97/8 RA = 3 RV = 42/4 PA = 43/23 (30)  PCW = 14 Fick cardiac output/index = 5.4/2.5 PVR = 3.0 WU SVR = 1036 FA sat =  99% PA sat = 70% 71% SVC sat = 66%  He is here for f/u.   ICD interrogated in clinic: No VT. 99% Biv pacing (AS-VP 89%, AP-VP 10%). Volume ok. Activity level 4-6hr/day Personally reviewed  Cardiac studies:  RHC 04/23/20  On milrinone 0.125 mcg/kg/min and sildenafil 40 tid  RA = 3 RV =69/4 PA = 68/23 (41) PCW = 22 Fick cardiac output/index = 5.1/2.3 PVR = 3.8 WU Ao sat = 99% PA sat = 68%, 70%  Assessment: 1. Much improved hemodynamics 2. Now with moderate mixed PH   RHC on 04/18/20 RA = 15 prominent v-waves RV = 132/17 PA =  132/52 (77) PCW = 31 Fick cardiac output/index = 2.7/1.35 PVR = 17.3 WU FA sat = 99% PA sat = 49%, 51% SVC sat = 54%  Assessment: 1. Very severe mixed pulmonary HTN 2. Severely reduced CO in setting of cor pulmonale  3. No evidence of intracardiac shunting  2D Echo 08/17/20: EF 30-35% Grade 3DD  RV ok Very mild AS   Review of systems complete and found to be negative unless listed in HPI.     Past Medical History:  Diagnosis Date   CHF (congestive heart failure) (HCC)    Heart failure (HCC)    Pulmonary hyperinflation     Current Outpatient Medications  Medication Sig Dispense Refill   atorvastatin (LIPITOR) 40 MG tablet Take 40 mg by mouth at bedtime.     ibuprofen (ADVIL) 200 MG tablet Take 200-400 mg  by mouth every 6 (six) hours as needed for moderate pain or headache.     losartan (COZAAR) 25 MG tablet Take 1 tablet (25 mg total) by mouth at bedtime. 90 tablet 3   metFORMIN (GLUCOPHAGE) 1000 MG tablet Take 1,000 mg by mouth 2 (two) times daily.     metFORMIN (GLUCOPHAGE) 500 MG tablet Take 1 tablet (500 mg total) by mouth 2 (two) times daily. 60 tablet 0   potassium chloride SA (KLOR-CON) 20 MEQ tablet Take 1 tablet (20 mEq total) by mouth daily. 90 tablet 3   sildenafil (REVATIO) 20 MG tablet Take 3 tablets (60 mg total) by mouth 3 (three) times daily. 180 tablet 6   spironolactone (ALDACTONE) 25 MG tablet Take 1 tablet (25 mg total)  by mouth daily. 90 tablet 3   torsemide (DEMADEX) 20 MG tablet Take 2 tablets (40 mg total) by mouth every morning AND 1 tablet (20 mg total) every evening. 270 tablet 3   VITAMIN D PO Take 1 capsule by mouth daily.     No current facility-administered medications for this encounter.    No Known Allergies    Social History   Socioeconomic History   Marital status: Single    Spouse name: Not on file   Number of children: Not on file   Years of education: Not on file   Highest education level: Not on file  Occupational History   Not on file  Tobacco Use   Smoking status: Never   Smokeless tobacco: Never  Vaping Use   Vaping Use: Never used  Substance and Sexual Activity   Alcohol use: No   Drug use: No   Sexual activity: Not on file  Other Topics Concern   Not on file  Social History Narrative   Not on file   Social Determinants of Health   Financial Resource Strain: Not on file  Food Insecurity: Not on file  Transportation Needs: Not on file  Physical Activity: Not on file  Stress: Not on file  Social Connections: Not on file  Intimate Partner Violence: Not on file      Family History  Problem Relation Age of Onset   Healthy Mother    Healthy Father     There were no vitals filed for this visit.     PHYSICAL EXAM: General:  Well appearing. No resp difficulty HEENT: normal Neck: supple. JVP 8 Carotids 2+ bilat; no bruits. No lymphadenopathy or thryomegaly appreciated. Cor: PMI nondisplaced. Regular rate & rhythm. No rubs, gallops or murmurs. Lungs: clear Abdomen: soft, nontender, nondistended. No hepatosplenomegaly. No bruits or masses. Good bowel sounds. Extremities: no cyanosis, clubbing, rash, trace-1+ edema on left leg.  Neuro: alert & orientedx3, cranial nerves grossly intact. moves all 4 extremities w/o difficulty. Affect pleasant    ASSESSMENT & PLAN:  1. Chronic biventricular systolic HF - Echo 5/22: EF 25-30% mild RV HK Mild septal  flattening TR not significant to estimate RVSP - Etiology remains unclear. There is no evidence of residual shunt or infiltrative process on cMRI/MRA or RHC. No scar - s/p STJ CRT-D in setting of CHB - NYHA I - Volume status looks slightly elevated ReDS 40%. Take extra torsemide as neded - Continue spiro 25 mg daily - Continue Farxiga 10mg  daily - Continue torsemide 40 daily - Continue Losartan 25 mg daily - BP too low to add b-blocker - Labs today.  - ICD interrogated personally in clinic. No VT. Volume up. Personally reviewed - R/L cath next  week.  - Repeat echo at next visit   2. Pulmonary HTN - Possible WHO Group I (congenital heart disease) +/-  II - Initial RHC 04/18/20 c/w very severe mixed pulmonary HTN, severely reduced CO in setting of cor pulmonale and no evidence of intracardiac shunting - Repeat RHC 04/23/20 on milrinone + sildenafil with much improved hemodynamics - RHC 10/22 with marked improvement  - V/Q scan 5/22 negative for PE  - Autoimmune serologies negative  - Doing well on Sildenafil 60 tid - NYHA Class I. Volume status slightyl elevated.  - R/L cath next week.   3. CHB - s/p CRT 5/22, followed by Dr. Graciela Husbands  - no change   4. Congenital heart disease - h/o aortic coarctation s/p surgical repair in 1987 in [place] (age 72) with residual thoracic aortic dialtion on imaging 12/18 - needs f/u imaging to assure stable size  5. DM2 - continue Farxiga & metformin    Arvilla Meres, MD 01/19/22

## 2022-01-21 ENCOUNTER — Inpatient Hospital Stay (HOSPITAL_BASED_OUTPATIENT_CLINIC_OR_DEPARTMENT_OTHER)
Admission: RE | Admit: 2022-01-21 | Discharge: 2022-01-21 | Disposition: A | Discharge status: Home / Self Care | Payer: Self-pay | Source: Ambulatory Visit | Attending: Internal Medicine | Admitting: Internal Medicine

## 2022-01-21 DIAGNOSIS — I5022 Chronic systolic (congestive) heart failure: Secondary | ICD-10-CM

## 2022-03-19 IMAGING — MR MR MRA CHEST W/ OR W/O CM
16 series · 16 of 16 positions shown · IV contrast (gadavist)
Comparison: Cardiac MRI 04/18/2020

CLINICAL DATA: 39-year-old with history of aortic coarctation and
repaired as a child. Evaluate for thoracic aortic dilatation.

EXAM:
MRA CHEST WITH OR WITHOUT CONTRAST
TECHNIQUE: Angiographic images of the chest were obtained using MRA technique
with and without intravenous contrast.
CONTRAST:  10mL GADAVIST GADOBUTROL 1 MMOL/ML IV SOLN

[Series 3: (person_name)_(person_name)_(person_name) · sagittal · 8.0mm · 1.79mm/px · 1 of 25 slices shown (1 of 6)]
[im 1/25]
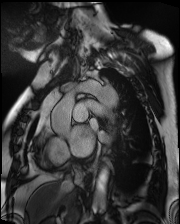

[Series 3: (person_name)_(person_name)_(person_name) · sagittal · 8.0mm · 1.79mm/px · 1 of 25 slices shown (2 of 6)]
[im 1/25]
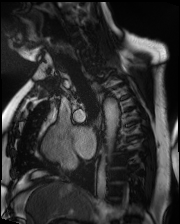

[Series 3: (person_name)_(person_name)_(person_name) · sagittal · 8.0mm · 1.79mm/px · 1 of 25 slices shown (3 of 6)]
[im 1/25]
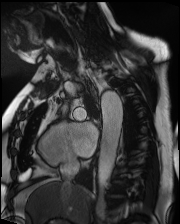

[Series 3: (person_name)_(person_name)_(person_name) · sagittal · 8.0mm · 1.79mm/px · 1 of 25 slices shown (4 of 6)]
[im 1/25]
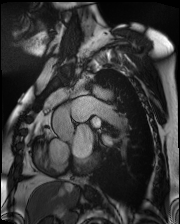

[Series 3: (person_name)_(person_name)_(person_name) · sagittal · 8.0mm · 1.79mm/px · 1 of 25 slices shown (5 of 6)]
[im 1/25]
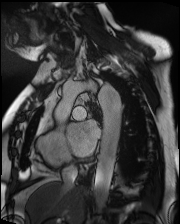

[Series 3: (person_name)_(person_name)_(person_name) · sagittal · 8.0mm · 1.79mm/px · 1 of 25 slices shown (6 of 6)]
[im 1/25]
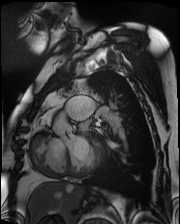

[Series 4: axial_db_haste_loc · axial · 7.0mm · 1.48mm/px · 1 of 24 slices shown]
[im 1/24]
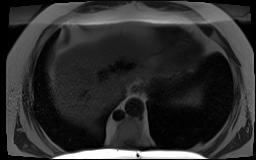

[Series 5: T1 dynamic · axial · non-contrast · 3.3mm · 1.18mm/px · 1 of 80 slices shown]
[im 1/80]
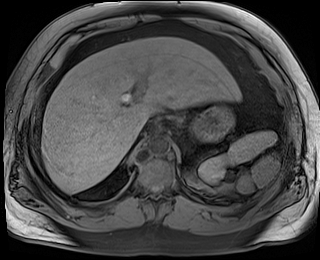

[Series 6: t2_trufi_tra_p2_bh · axial · 8.0mm · 0.62mm/px · 1 of 25 slices shown]
[im 1/25]
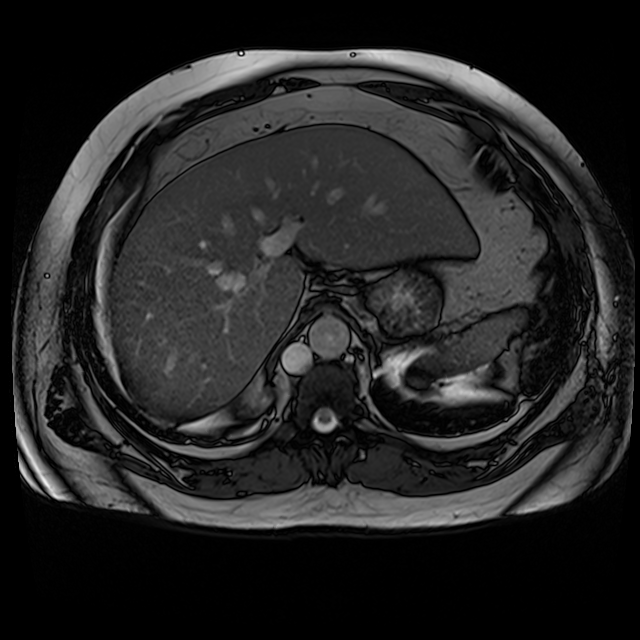

[Series 7: angio_fl3d_sag_pre · sagittal · 1.1mm · 1.17mm/px · 1 of 112 slices shown]
[im 1/112]
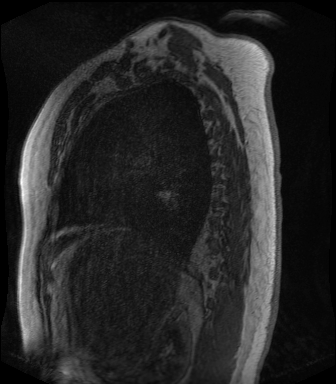

[Series 9: candy cane ce-arterial · sagittal · arterial · 1.1mm · 1.17mm/px · 1 of 112 slices shown]
[im 1/112]
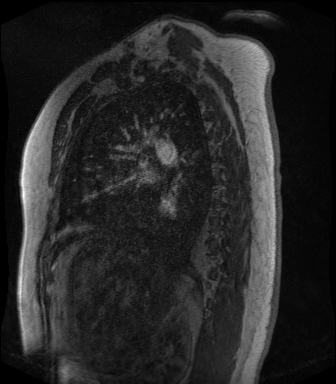

[Series 10: candy cane ce-arterial_sub · sagittal · 1.1mm · 1.17mm/px · 1 of 112 slices shown]
[im 1/112]
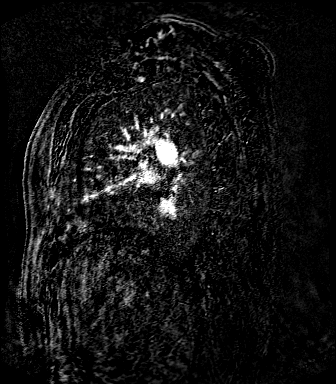

[Series 12: candy cane ce-venous · sagittal · portal-venous · 1.1mm · 1.17mm/px · 1 of 112 slices shown]
[im 1/112]
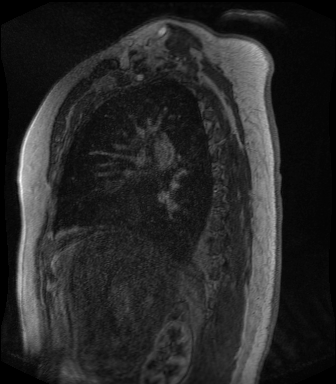

[Series 13: candy cane ce-venous_sub · sagittal · 1.1mm · 1.17mm/px · 1 of 112 slices shown]
[im 1/112]
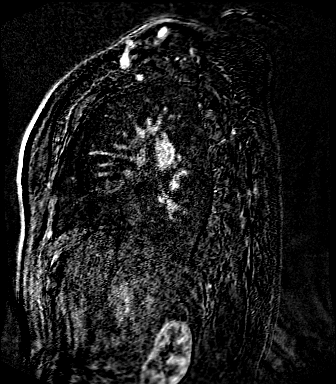

[Series 15: T1 dynamic post-contrast · axial · 3.3mm · 1.18mm/px · 1 of 80 slices shown]
[im 1/80]
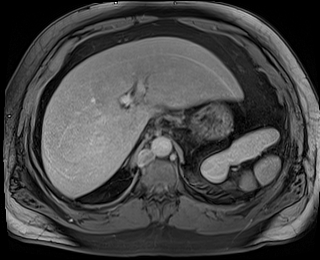

[Series 1101: mip rotate · sagittal · 1.1mm · 0.34mm/px · 1 of 4 slices shown]
[im 1/4]
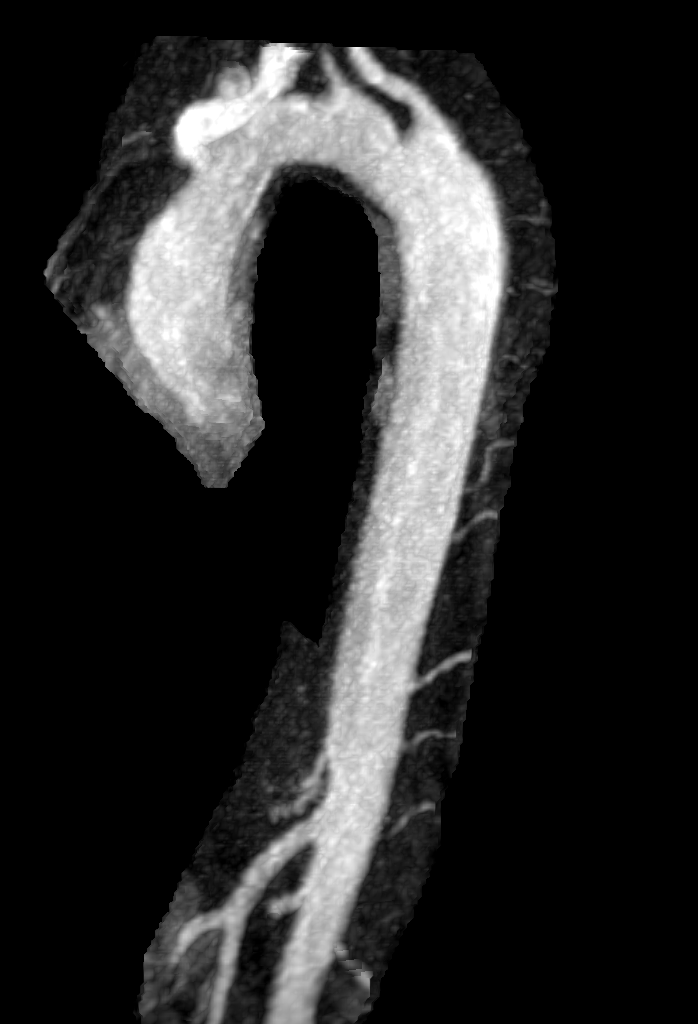

[16 of 16 positions shown; findings below may reference images not displayed]

FINDINGS: VASCULAR

Aorta: Ascending thoracic aorta measures up to 3.8 cm. Unusual
configuration of the distal aortic arch is compatible with history
of coarctation and repair. The brachiocephalic artery and left
common carotid artery originate near each other but not clear if
there is a common trunk. Left vertebral artery originates from the
aortic arch. Distal to the left vertebral artery, there is mild
narrowing of the aortic arch measuring roughly 2.3 cm. Left
subclavian artery originates distal to the narrowing in the distal
aortic arch. Proximal descending thoracic aorta measures up to
cm. Mid descending thoracic aorta measures 3.5 cm. Distal descending
thoracic aorta measures 2.8 cm. There is no evidence for an aortic
dissection.

Heart: Enlarged left ventricle.  Refer to the recent cardiac MRI.

Pulmonary Arteries: Main pulmonary artery is large for size
measuring up 4.3 cm. Main pulmonary arteries are patent without
large filling defects.

Other: Prominent azygos vein. Main central venous structures appear
to be patent. There appears to be a short SVC.

NON-VASCULAR

Mediastinal structures are unremarkable. No significant
lymphadenopathy. No significant pleural fluid. Small amount of
signal in the lingular region is indeterminate. Bone structures are
unremarkable. Images of the upper abdomen are unremarkable.
IMPRESSION: 1. Findings are compatible with history of aortic coarctation and
repair. Mild narrowing of the distal aortic arch. The left
subclavian artery originates distal to this mild aortic arch
narrowing. Ascending thoracic aorta measures up to 3.8 cm. The
proximal descending thoracic aorta measures up to 3.8 cm.
2. Enlarged main pulmonary artery is suggestive for pulmonary
hypertension.
3. Prominent azygos vein.

## 2022-03-25 ENCOUNTER — Ambulatory Visit (INDEPENDENT_AMBULATORY_CARE_PROVIDER_SITE_OTHER): Payer: Self-pay

## 2022-03-25 DIAGNOSIS — I442 Atrioventricular block, complete: Secondary | ICD-10-CM

## 2022-03-25 LAB — CUP PACEART REMOTE DEVICE CHECK
Battery Remaining Longevity: 58 mo
Battery Remaining Percentage: 70 %
Battery Voltage: 2.95 V
Brady Statistic AP VP Percent: 7.1 %
Brady Statistic AP VS Percent: 1 %
Brady Statistic AS VP Percent: 92 %
Brady Statistic AS VS Percent: 1 %
Brady Statistic RA Percent Paced: 6.9 %
Date Time Interrogation Session: 20230425030052
HighPow Impedance: 88 Ohm
Implantable Lead Implant Date: 20210526
Implantable Lead Implant Date: 20210526
Implantable Lead Implant Date: 20210526
Implantable Lead Location: 753858
Implantable Lead Location: 753859
Implantable Lead Location: 753860
Implantable Pulse Generator Implant Date: 20210526
Lead Channel Impedance Value: 480 Ohm
Lead Channel Impedance Value: 510 Ohm
Lead Channel Impedance Value: 590 Ohm
Lead Channel Pacing Threshold Amplitude: 0.625 V
Lead Channel Pacing Threshold Amplitude: 0.75 V
Lead Channel Pacing Threshold Amplitude: 1.75 V
Lead Channel Pacing Threshold Pulse Width: 0.5 ms
Lead Channel Pacing Threshold Pulse Width: 0.5 ms
Lead Channel Pacing Threshold Pulse Width: 1 ms
Lead Channel Sensing Intrinsic Amplitude: 12 mV
Lead Channel Sensing Intrinsic Amplitude: 5 mV
Lead Channel Setting Pacing Amplitude: 1.625
Lead Channel Setting Pacing Amplitude: 2.5 V
Lead Channel Setting Pacing Amplitude: 2.5 V
Lead Channel Setting Pacing Pulse Width: 0.5 ms
Lead Channel Setting Pacing Pulse Width: 1 ms
Lead Channel Setting Sensing Sensitivity: 0.5 mV
Pulse Gen Serial Number: 111023761

## 2022-04-09 NOTE — Progress Notes (Signed)
Remote ICD transmission.   

## 2022-06-04 ENCOUNTER — Other Ambulatory Visit (HOSPITAL_COMMUNITY): Payer: Self-pay | Admitting: Internal Medicine

## 2022-06-24 ENCOUNTER — Ambulatory Visit (INDEPENDENT_AMBULATORY_CARE_PROVIDER_SITE_OTHER): Payer: Self-pay

## 2022-06-24 DIAGNOSIS — I442 Atrioventricular block, complete: Secondary | ICD-10-CM

## 2022-06-25 ENCOUNTER — Other Ambulatory Visit (HOSPITAL_COMMUNITY): Payer: Self-pay | Admitting: *Deleted

## 2022-06-25 LAB — CUP PACEART REMOTE DEVICE CHECK
Battery Remaining Longevity: 55 mo
Battery Remaining Percentage: 67 %
Battery Voltage: 2.95 V
Brady Statistic AP VP Percent: 6.5 %
Brady Statistic AP VS Percent: 1 %
Brady Statistic AS VP Percent: 93 %
Brady Statistic AS VS Percent: 1 %
Brady Statistic RA Percent Paced: 6.3 %
Date Time Interrogation Session: 20230726130823
HighPow Impedance: 78 Ohm
Implantable Lead Implant Date: 20210526
Implantable Lead Implant Date: 20210526
Implantable Lead Implant Date: 20210526
Implantable Lead Location: 753858
Implantable Lead Location: 753859
Implantable Lead Location: 753860
Implantable Pulse Generator Implant Date: 20210526
Lead Channel Impedance Value: 450 Ohm
Lead Channel Impedance Value: 490 Ohm
Lead Channel Impedance Value: 590 Ohm
Lead Channel Pacing Threshold Amplitude: 0.625 V
Lead Channel Pacing Threshold Amplitude: 0.75 V
Lead Channel Pacing Threshold Amplitude: 1.75 V
Lead Channel Pacing Threshold Pulse Width: 0.5 ms
Lead Channel Pacing Threshold Pulse Width: 0.5 ms
Lead Channel Pacing Threshold Pulse Width: 1 ms
Lead Channel Sensing Intrinsic Amplitude: 12 mV
Lead Channel Sensing Intrinsic Amplitude: 5 mV
Lead Channel Setting Pacing Amplitude: 1.625
Lead Channel Setting Pacing Amplitude: 2.5 V
Lead Channel Setting Pacing Amplitude: 2.5 V
Lead Channel Setting Pacing Pulse Width: 0.5 ms
Lead Channel Setting Pacing Pulse Width: 1 ms
Lead Channel Setting Sensing Sensitivity: 0.5 mV
Pulse Gen Serial Number: 111023761

## 2022-06-25 MED ORDER — SILDENAFIL CITRATE 20 MG PO TABS: 60.0000 mg | ORAL_TABLET | Freq: Three times a day (TID) | ORAL | 6 refills | Status: DC

## 2022-07-21 NOTE — Progress Notes (Signed)
Remote ICD transmission.   

## 2022-09-18 ENCOUNTER — Other Ambulatory Visit (HOSPITAL_COMMUNITY): Payer: Self-pay | Admitting: Internal Medicine

## 2022-09-23 ENCOUNTER — Ambulatory Visit (INDEPENDENT_AMBULATORY_CARE_PROVIDER_SITE_OTHER): Payer: Self-pay

## 2022-09-23 DIAGNOSIS — I442 Atrioventricular block, complete: Secondary | ICD-10-CM

## 2022-09-24 LAB — CUP PACEART REMOTE DEVICE CHECK
Battery Remaining Longevity: 50 mo
Battery Remaining Percentage: 63 %
Battery Voltage: 2.93 V
Brady Statistic AP VP Percent: 7.9 %
Brady Statistic AP VS Percent: 1 %
Brady Statistic AS VP Percent: 91 %
Brady Statistic AS VS Percent: 1 %
Brady Statistic RA Percent Paced: 7.7 %
Date Time Interrogation Session: 20231024112432
HighPow Impedance: 80 Ohm
Implantable Lead Connection Status: 753985
Implantable Lead Connection Status: 753985
Implantable Lead Connection Status: 753985
Implantable Lead Implant Date: 20210526
Implantable Lead Implant Date: 20210526
Implantable Lead Implant Date: 20210526
Implantable Lead Location: 753858
Implantable Lead Location: 753859
Implantable Lead Location: 753860
Implantable Pulse Generator Implant Date: 20210526
Lead Channel Impedance Value: 430 Ohm
Lead Channel Impedance Value: 450 Ohm
Lead Channel Impedance Value: 530 Ohm
Lead Channel Pacing Threshold Amplitude: 0.75 V
Lead Channel Pacing Threshold Amplitude: 0.75 V
Lead Channel Pacing Threshold Amplitude: 1.75 V
Lead Channel Pacing Threshold Pulse Width: 0.5 ms
Lead Channel Pacing Threshold Pulse Width: 0.5 ms
Lead Channel Pacing Threshold Pulse Width: 1 ms
Lead Channel Sensing Intrinsic Amplitude: 12 mV
Lead Channel Sensing Intrinsic Amplitude: 5 mV
Lead Channel Setting Pacing Amplitude: 1.75 V
Lead Channel Setting Pacing Amplitude: 2.5 V
Lead Channel Setting Pacing Amplitude: 2.5 V
Lead Channel Setting Pacing Pulse Width: 0.5 ms
Lead Channel Setting Pacing Pulse Width: 1 ms
Lead Channel Setting Sensing Sensitivity: 0.5 mV
Pulse Gen Serial Number: 111023761
Zone Setting Status: 755011

## 2022-10-13 NOTE — Progress Notes (Signed)
Remote ICD transmission.   

## 2022-12-24 LAB — CUP PACEART REMOTE DEVICE CHECK
Battery Remaining Longevity: 47 mo
Battery Remaining Percentage: 59 %
Battery Voltage: 2.93 V
Brady Statistic AP VP Percent: 7.8 %
Brady Statistic AP VS Percent: 1 %
Brady Statistic AS VP Percent: 91 %
Brady Statistic AS VS Percent: 1 %
Brady Statistic RA Percent Paced: 7.5 %
Date Time Interrogation Session: 20240123083802
HighPow Impedance: 75 Ohm
Implantable Lead Connection Status: 753985
Implantable Lead Connection Status: 753985
Implantable Lead Connection Status: 753985
Implantable Lead Implant Date: 20210526
Implantable Lead Implant Date: 20210526
Implantable Lead Implant Date: 20210526
Implantable Lead Location: 753858
Implantable Lead Location: 753859
Implantable Lead Location: 753860
Implantable Pulse Generator Implant Date: 20210526
Lead Channel Impedance Value: 410 Ohm
Lead Channel Impedance Value: 450 Ohm
Lead Channel Impedance Value: 530 Ohm
Lead Channel Pacing Threshold Amplitude: 0.75 V
Lead Channel Pacing Threshold Amplitude: 0.75 V
Lead Channel Pacing Threshold Amplitude: 1.75 V
Lead Channel Pacing Threshold Pulse Width: 0.5 ms
Lead Channel Pacing Threshold Pulse Width: 0.5 ms
Lead Channel Pacing Threshold Pulse Width: 1 ms
Lead Channel Sensing Intrinsic Amplitude: 12 mV
Lead Channel Sensing Intrinsic Amplitude: 5 mV
Lead Channel Setting Pacing Amplitude: 1.75 V
Lead Channel Setting Pacing Amplitude: 2.5 V
Lead Channel Setting Pacing Amplitude: 2.5 V
Lead Channel Setting Pacing Pulse Width: 0.5 ms
Lead Channel Setting Pacing Pulse Width: 1 ms
Lead Channel Setting Sensing Sensitivity: 0.5 mV
Pulse Gen Serial Number: 111023761
Zone Setting Status: 755011

## 2022-12-25 ENCOUNTER — Ambulatory Visit (INDEPENDENT_AMBULATORY_CARE_PROVIDER_SITE_OTHER): Payer: Self-pay

## 2022-12-25 ENCOUNTER — Other Ambulatory Visit (HOSPITAL_COMMUNITY): Payer: Self-pay | Admitting: Internal Medicine

## 2022-12-25 DIAGNOSIS — I442 Atrioventricular block, complete: Secondary | ICD-10-CM

## 2023-01-13 NOTE — Progress Notes (Signed)
Remote ICD transmission.   

## 2023-03-26 ENCOUNTER — Ambulatory Visit (INDEPENDENT_AMBULATORY_CARE_PROVIDER_SITE_OTHER): Payer: Commercial Managed Care - HMO

## 2023-03-26 DIAGNOSIS — I442 Atrioventricular block, complete: Secondary | ICD-10-CM | POA: Diagnosis not present

## 2023-03-26 LAB — CUP PACEART REMOTE DEVICE CHECK
Battery Remaining Longevity: 44 mo
Battery Remaining Percentage: 56 %
Battery Voltage: 2.93 V
Brady Statistic AP VP Percent: 8.1 %
Brady Statistic AP VS Percent: 1 %
Brady Statistic AS VP Percent: 91 %
Brady Statistic AS VS Percent: 1 %
Brady Statistic RA Percent Paced: 7.9 %
Date Time Interrogation Session: 20240425080037
HighPow Impedance: 77 Ohm
Implantable Lead Connection Status: 753985
Implantable Lead Connection Status: 753985
Implantable Lead Connection Status: 753985
Implantable Lead Implant Date: 20210526
Implantable Lead Implant Date: 20210526
Implantable Lead Implant Date: 20210526
Implantable Lead Location: 753858
Implantable Lead Location: 753859
Implantable Lead Location: 753860
Implantable Pulse Generator Implant Date: 20210526
Lead Channel Impedance Value: 410 Ohm
Lead Channel Impedance Value: 450 Ohm
Lead Channel Impedance Value: 530 Ohm
Lead Channel Pacing Threshold Amplitude: 0.75 V
Lead Channel Pacing Threshold Amplitude: 0.75 V
Lead Channel Pacing Threshold Amplitude: 1.75 V
Lead Channel Pacing Threshold Pulse Width: 0.5 ms
Lead Channel Pacing Threshold Pulse Width: 0.5 ms
Lead Channel Pacing Threshold Pulse Width: 1 ms
Lead Channel Sensing Intrinsic Amplitude: 11.8 mV
Lead Channel Sensing Intrinsic Amplitude: 5 mV
Lead Channel Setting Pacing Amplitude: 1.75 V
Lead Channel Setting Pacing Amplitude: 2.5 V
Lead Channel Setting Pacing Amplitude: 2.5 V
Lead Channel Setting Pacing Pulse Width: 0.5 ms
Lead Channel Setting Pacing Pulse Width: 1 ms
Lead Channel Setting Sensing Sensitivity: 0.5 mV
Pulse Gen Serial Number: 111023761
Zone Setting Status: 755011

## 2023-04-06 ENCOUNTER — Other Ambulatory Visit (HOSPITAL_COMMUNITY): Payer: Self-pay | Admitting: Internal Medicine

## 2023-04-11 ENCOUNTER — Other Ambulatory Visit (HOSPITAL_COMMUNITY): Payer: Self-pay | Admitting: Internal Medicine

## 2023-04-21 NOTE — Progress Notes (Signed)
Remote ICD transmission.   

## 2023-06-25 ENCOUNTER — Ambulatory Visit (INDEPENDENT_AMBULATORY_CARE_PROVIDER_SITE_OTHER): Payer: Commercial Managed Care - HMO

## 2023-06-25 DIAGNOSIS — I442 Atrioventricular block, complete: Secondary | ICD-10-CM

## 2023-07-06 ENCOUNTER — Telehealth: Payer: Self-pay

## 2023-07-06 NOTE — Telephone Encounter (Signed)
Following alert received from [employer] received for AF greater than 3 days and is still ongoing.  No OAC, good ventricular rate control.  AF burden is less than 1%.    Called patient to advise AF noted and pt is overdue to be seen in clinic. Advised pt that someone from scheduling will contact them for apt. Voiced understanding.

## 2023-07-07 NOTE — Telephone Encounter (Signed)
Spoke to patient Garment/textile technologist) who advised AF was noted on device and Dr. Graciela Husbands would like patient to be seen in AF clinic to discuss OAC. Patient agreeable to plan.

## 2023-07-07 NOTE — Telephone Encounter (Signed)
Ladies  can we (try and) get this gentleman to come in to AFib clinic for anticoagulation and then DCCV Thanks SK

## 2023-07-07 NOTE — Progress Notes (Signed)
Remote ICD transmission.   

## 2023-07-14 ENCOUNTER — Other Ambulatory Visit (HOSPITAL_COMMUNITY): Payer: Self-pay | Admitting: Internal Medicine

## 2023-07-27 ENCOUNTER — Telehealth: Payer: Self-pay | Admitting: Licensed Clinical Social Worker

## 2023-07-27 ENCOUNTER — Ambulatory Visit (HOSPITAL_COMMUNITY)
Admission: RE | Admit: 2023-07-27 | Discharge: 2023-07-27 | Disposition: A | Discharge status: Home / Self Care | Payer: Commercial Managed Care - HMO | Source: Ambulatory Visit | Attending: Internal Medicine | Admitting: Internal Medicine

## 2023-07-27 VITALS — BP 102/76 | HR 70 | Ht 71.0 in | Wt 188.2 lb

## 2023-07-27 DIAGNOSIS — I4891 Unspecified atrial fibrillation: Secondary | ICD-10-CM | POA: Diagnosis not present

## 2023-07-27 DIAGNOSIS — I4819 Other persistent atrial fibrillation: Secondary | ICD-10-CM | POA: Diagnosis present

## 2023-07-27 DIAGNOSIS — Z7984 Long term (current) use of oral hypoglycemic drugs: Secondary | ICD-10-CM | POA: Insufficient documentation

## 2023-07-27 DIAGNOSIS — D6869 Other thrombophilia: Secondary | ICD-10-CM | POA: Insufficient documentation

## 2023-07-27 DIAGNOSIS — Z7901 Long term (current) use of anticoagulants: Secondary | ICD-10-CM | POA: Diagnosis not present

## 2023-07-27 MED ORDER — APIXABAN 5 MG PO TABS: 5.0000 mg | ORAL_TABLET | Freq: Two times a day (BID) | ORAL | 6 refills | Status: DC

## 2023-07-27 NOTE — Patient Instructions (Addendum)
Start Eliquis 5mg  twice a day    Once covered with insurance call to schedule appointment [phone]

## 2023-07-27 NOTE — Progress Notes (Signed)
Heart and Vascular Care Navigation  07/27/2023  Gabriel Floyd 25-Sep-1981 [id number]  Reason for Referral: uninsured, upcoming procedures needed Patient is participating in a Managed Medicaid Plan: No, self pay only  Engaged with patient by telephone for initial visit for Heart and Vascular Care Coordination.                                                                                                   Assessment:                           LCSW was able to reach pt this afternoon at [phone]. With assistance of Spanish language interpreter Kennedy, [phone]; I was able to introduce self, role, reason for call. Pt confirmed home address, was seeing Gabriel Floyd for primary care. He works, does not have insurance and is not Medicaid eligible. Resides with family. He has previously completed the [employer] application so is familiar with the process. He denies any additional concerns with housing, food, transportation or utilities. He is interested in financial assistance as the bills for recommended procedures are too high for him to manage. He confirms he has received application for Eliquis assistance during his appt . No additional questions at this time. Will f/u to ensure it has been received/answer any additional questions.   HRT/VAS Care Coordination     Patients Home Cardiology Office Heart Failure Clinic   Outpatient Care Team Social Worker   Social Worker Name: Gabriel Floyd, [place], [phone]   Living arrangements for the past 2 months Single Family Home   Lives with: Parents   Patient Current Insurance Coverage Self-Pay   Patient Has Concern With Paying Medical Bills Yes   Patient Concerns With Medical Bills no insurance, ongoing medical care needed   Medical Bill Referrals: [facility] Financial Assistance   Does Patient Have Prescription Coverage? No   Patient Prescription Assistance Programs Patient Assistance Programs; [place] [facility]   Home Assistive Devices/Equipment  None       Social History:                                                                             SDOH Screenings   Food Insecurity: No Food Insecurity (07/27/2023)  Housing: Low Risk  (07/27/2023)  Transportation Needs: No Transportation Needs (07/27/2023)  Utilities: Not At Risk (07/27/2023)  Financial Resource Strain: Low Risk  (07/27/2023)  Physical Activity: Not at Risk (10/30/2022)   Received from [place], [place]  Social Connections: Not at Risk (10/30/2022)   Received from [place], [place]  Stress: Not at Risk (10/30/2022)   Received from [place], [place]  Tobacco Use: Low Risk  (09/30/2021)  Health Literacy: Adequate Health Literacy (07/27/2023)    SDOH Interventions: Financial Resources:  Financial Strain Interventions: Scientist, forensic  Assistance; [employer]; [employer] and [place] and [place] applications all sent to pt) Editor, commissioning for [employer] Program  Food Insecurity:  Food Insecurity Interventions: Intervention Not Indicated  Housing Insecurity:  Housing Interventions: Intervention Not Indicated  Transportation:   Transportation Interventions: Intervention Not Indicated    Other Care Navigation Interventions:     Provided Pharmacy assistance resources Patient Assistance Programs, [place] [facility]   Follow-up plan:   LCSW has mailed pt the following: my card, [employer] application, [employer] application, [place] and [place] (Farxiga) application. My understanding is that Afib clinic provided pt with Eliquis [facility] [employer] application). I requested my colleague Eileen Stanford route pt for f/u appt with primary cardiology team at [facility] clinic. I will f/u to ensure pt has received application and assist as needed.

## 2023-07-27 NOTE — Progress Notes (Addendum)
Primary Care Physician: Patient, [name] Primary Cardiologist: None Electrophysiologist: None     Referring Physician: Dr. Karlene Einstein is a 42 y.o. male with a history of complete heart block s/p ICD, T2DM, systolic heart failure, diastolic heart failure, VSD, pulmonary hypertension, coarctation of aorta s/p repair, and persistent atrial fibrillation who presents for consultation in the [facility] Atrial Fibrillation [facility]. Device clinic alert on 8/5 noting Afib > 3 days recommended by Dr. Graciela Husbands to discuss [facility]. Patient has a CHADS2VASC score of 2.  On evaluation today, he is currently in Afib. He believes to feel more tired than normal.    Today, he denies symptoms of palpitations, chest pain, shortness of breath, orthopnea, PND, lower extremity edema, dizziness, presyncope, syncope, snoring, daytime somnolence, bleeding, or neurologic sequela. The patient is tolerating medications without difficulties and is otherwise without complaint today.   he has a BMI of Body mass index is 26.25 kg/m.Marland Kitchen Filed Weights   07/27/23 1147  Weight: 85.4 kg    Current Outpatient Medications  Medication Sig Dispense Refill   atorvastatin (LIPITOR) 40 MG tablet Take 40 mg by mouth at bedtime.     dapagliflozin propanediol (FARXIGA) 5 MG TABS tablet Take 2 tablets by mouth daily.     ibuprofen (ADVIL) 200 MG tablet Take 200-400 mg by mouth every 6 (six) hours as needed for moderate pain or headache.     metFORMIN (GLUCOPHAGE) 1000 MG tablet Take 1,000 mg by mouth 2 (two) times daily.     potassium chloride SA (KLOR-CON M20) 20 MEQ tablet Take 1 tablet (20 mEq total) by mouth daily. NEEDS FOLLOW UP APPOINTMENT FOR MORE REFILLS 90 tablet 0   sildenafil (REVATIO) 20 MG tablet TAKE 3 TABLETS BY MOUTH THREE TIMES DAILY 270 tablet 0   spironolactone (ALDACTONE) 25 MG tablet TAKE 1 TABLET BY MOUTH ONCE DAILY . APPOINTMENT REQUIRED FOR FUTURE REFILLS 90 tablet 0   torsemide (DEMADEX) 20 MG  tablet TAKE 2 TABLETS BY MOUTH IN THE MORNING AND 1 IN THE EVENING 270 tablet 0   triamcinolone (KENALOG) 0.025 % ointment Apply topically 2 (two) times daily.     VITAMIN D PO Take 1 capsule by mouth daily.     losartan (COZAAR) 25 MG tablet Take 1 tablet (25 mg total) by mouth at bedtime. 90 tablet 3   No current facility-administered medications for this encounter.    Atrial Fibrillation Management history:  Previous antiarrhythmic drugs: None Previous cardioversions: None Previous ablations: None Anticoagulation history: None   ROS- All systems are reviewed and negative except as per the HPI above.  Physical Exam: BP 102/76   Pulse 70   Ht 5\' 11"  (1.803 m)   Wt 85.4 kg   BMI 26.25 kg/m   GEN: Well nourished, well developed in no acute distress NECK: No JVD; No carotid bruits CARDIAC: Irregularly irregular rate and rhythm, no murmurs, rubs, gallops RESPIRATORY:  Clear to auscultation without rales, wheezing or rhonchi  ABDOMEN: Soft, non-tender, non-distended EXTREMITIES:  No edema; No deformity   EKG today demonstrates  Vent. rate 70 BPM PR interval * ms QRS duration 174 ms QT/QTcB 520/561 ms P-R-T axes * 224 -23 Ventricular-paced rhythm Abnormal ECG When compared with ECG of 27-Sep-2021 12:16, PREVIOUS ECG IS PRESENT  Echo 04/02/21 demonstrated  1. Left ventricular ejection fraction, by estimation, is 25 to 30%. The  left ventricle has severely decreased function. The left ventricle  demonstrates global hypokinesis. The left  ventricular internal cavity size  was mildly dilated. There is mild  eccentric left ventricular hypertrophy. Left ventricular diastolic  parameters are consistent with Grade III diastolic dysfunction  (restrictive). Elevated left atrial pressure. There appears to be  relatively more severe apical hypokinesis due to systolic  dyssynchrony.   2. Right ventricular systolic function is mildly reduced. The right  ventricular size is normal.    3. Left atrial size was severely dilated.   4. Right atrial size was moderately dilated.   5. The mitral valve is normal in structure. Trivial mitral valve  regurgitation.   6. The aortic valve is tricuspid. Aortic valve regurgitation is moderate.  No aortic stenosis is present.   7. The inferior vena cava is normal in size with greater than 50%  respiratory variability, suggesting right atrial pressure of 3 mmHg.    ASSESSMENT & PLAN CHA2DS2-VASc Score = 2  The patient's score is based upon: CHF History: 1 HTN History: 0 Diabetes History: 1 Stroke History: 0 Vascular Disease History: 0 Age Score: 0 Gender Score: 0       ASSESSMENT AND PLAN: Persistent Atrial Fibrillation (ICD10:  I48.19) The patient's CHA2DS2-VASc score is 2, indicating a 2.2% annual risk of stroke.    He appears to be in rate controlled atrial flutter.  Education provided about Afib. Discussion about medication treatments and ablation going forward if indicated. After discussion, patient agrees to begin anticoagulation and would like to proceed with cardioversion afterwards. However, he currently does not have insurance and so we will begin Eliquis and apply for patient assistance so he can undergo cardioversion as soon as he is approved.   Secondary Hypercoagulable State (ICD10:  D68.69) The patient is at significant risk for stroke/thromboembolism based upon his CHA2DS2-VASc Score of 2.  Start Apixaban (Eliquis).  After discussion with patient, he agrees to begin anticoagulation to prevent stroke secondary to abnormal rhythm. He will begin Eliquis 5 mg BID.    We will arrange cardioversion once his patient assistance has been approved.    Lake Bells, PA-C  Afib Clinic [facility] [address] [place], [place] [address] [phone]

## 2023-08-04 ENCOUNTER — Telehealth (HOSPITAL_COMMUNITY): Payer: Self-pay | Admitting: *Deleted

## 2023-08-04 NOTE — Telephone Encounter (Signed)
Patient approved for Eliquis patient assistance through 07/29/24. Application # [id number]

## 2023-08-05 ENCOUNTER — Telehealth: Payer: Self-pay | Admitting: Licensed Clinical Social Worker

## 2023-08-05 NOTE — Telephone Encounter (Signed)
H&V Care Navigation CSW Progress Note  Clinical Social Worker met with pt during Financial Counseling appt and got Farxiga assistance app from him- forwarded to [employer] RN to help follow up on physician portion being completed and sent in for review.   SDOH Screenings   Food Insecurity: No Food Insecurity (07/27/2023)  Housing: Low Risk  (07/27/2023)  Transportation Needs: No Transportation Needs (07/27/2023)  Utilities: Not At Risk (07/27/2023)  Financial Resource Strain: Low Risk  (07/27/2023)  Physical Activity: Not at Risk (10/30/2022)   Received from [place], [place]  Social Connections: Not at Risk (10/30/2022)   Received from [place], [place]  Stress: Not at Risk (10/30/2022)   Received from [place], [place]  Tobacco Use: Low Risk  (09/30/2021)  Health Literacy: Adequate Health Literacy (07/27/2023)   Burna Sis, LCSW Clinical Social Worker Advanced Heart Failure Clinic Desk#: [phone] Cell#: [phone]

## 2023-08-05 NOTE — Telephone Encounter (Signed)
H&V Care Navigation CSW Progress Note  Clinical Social Worker contacted patient by phone to f/u on assistance applications and let him know about Eliquis approval. Reached pt with assistance of Karn Pickler [phone], Spanish language interpreter at [phone]. Introduced self, role, reason for call. Pt shares he has completed applications, is gathering final documents and will drop by financial counseling office today. He was encouraged to bring the medication assistance documents ([employer] and [place] application) to his upcoming appt with Dr. Graciela Husbands. Advised him he was approved for Eliquis patient assistance and will receive a call to confirm before they send the first shipment. If he doesn't get that call this week he should f/u with the office. No additional questions at this time.  Patient is participating in a Managed Medicaid Plan:  No, self pay only  SDOH Screenings   Food Insecurity: No Food Insecurity (07/27/2023)  Housing: Low Risk  (07/27/2023)  Transportation Needs: No Transportation Needs (07/27/2023)  Utilities: Not At Risk (07/27/2023)  Financial Resource Strain: Low Risk  (07/27/2023)  Physical Activity: Not at Risk (10/30/2022)   Received from [place], [place]  Social Connections: Not at Risk (10/30/2022)   Received from [place], [place]  Stress: Not at Risk (10/30/2022)   Received from [place], [place]  Tobacco Use: Low Risk  (09/30/2021)  Health Literacy: Adequate Health Literacy (07/27/2023)    Octavio Graves, MSW, LCSW Clinical Social Worker II [facility] Heart/Vascular Care Navigation  [phone]- work cell phone (preferred) [phone]- desk phone

## 2023-08-07 ENCOUNTER — Telehealth: Payer: Self-pay | Admitting: Licensed Clinical Social Worker

## 2023-08-07 NOTE — Telephone Encounter (Signed)
H&V Care Navigation CSW Progress Note  Clinical Science writer  to [employer] for review. Will follow for determination.  Patient is participating in a Managed Medicaid Plan:  No, sefl pay only  SDOH Screenings   Food Insecurity: No Food Insecurity (07/27/2023)  Housing: Low Risk  (07/27/2023)  Transportation Needs: No Transportation Needs (07/27/2023)  Utilities: Not At Risk (07/27/2023)  Financial Resource Strain: Low Risk  (07/27/2023)  Physical Activity: Not at Risk (10/30/2022)   Received from [place], [place]  Social Connections: Not at Risk (10/30/2022)   Received from [place], [place]  Stress: Not at Risk (10/30/2022)   Received from [place], [place]  Tobacco Use: Low Risk  (09/30/2021)  Health Literacy: Adequate Health Literacy (07/27/2023)    Octavio Graves, MSW, LCSW Clinical Social Worker II [facility] Heart/Vascular Care Navigation  [phone]- work cell phone (preferred) [phone]- desk phone

## 2023-08-11 ENCOUNTER — Telehealth: Payer: Self-pay | Admitting: Licensed Clinical Social Worker

## 2023-08-11 NOTE — Telephone Encounter (Signed)
H&V Care Navigation CSW Progress Note  Clinical Social Worker contacted patient by phone to f/u on medication assistance. Was able to reach him at [phone], with assistance of Spanish language interpreter Jenne Campus, [phone]. He shares he has not yet received his Eliquis in the mail, reviewed with him again that they will be mailing it but likely will call to confirm home address. Since he hasn't heard from them yet I will text him the number for [facility] [employer] patient assistance to f/u. No additional questions from pt at this time.    I called [employer] and they are still working on processing applications, requested I call back at end of the week. Checked account notes and pt CAFA is also still pending.   Will f/u to ensure pt starts receiving Eliquis and send scripts for [facility].   Patient is participating in a Managed Medicaid Plan:  No, self pay, CAFA pending  SDOH Screenings   Food Insecurity: No Food Insecurity (07/27/2023)  Housing: Low Risk  (07/27/2023)  Transportation Needs: No Transportation Needs (07/27/2023)  Utilities: Not At Risk (07/27/2023)  Financial Resource Strain: Low Risk  (07/27/2023)  Physical Activity: Not at Risk (10/30/2022)   Received from [place], [place]  Social Connections: Not at Risk (10/30/2022)   Received from [place], [place]  Stress: Not at Risk (10/30/2022)   Received from [place], [place]  Tobacco Use: Low Risk  (09/30/2021)  Health Literacy: Adequate Health Literacy (07/27/2023)   Octavio Graves, MSW, LCSW Clinical Social Worker II [facility] Heart/Vascular Care Navigation  [phone]- work cell phone (preferred) [phone]- desk phone

## 2023-08-13 ENCOUNTER — Ambulatory Visit: Payer: Commercial Managed Care - HMO | Attending: Internal Medicine | Admitting: Internal Medicine

## 2023-08-13 ENCOUNTER — Other Ambulatory Visit (HOSPITAL_COMMUNITY): Payer: Self-pay | Admitting: *Deleted

## 2023-08-13 ENCOUNTER — Encounter: Payer: Self-pay | Admitting: Internal Medicine

## 2023-08-13 VITALS — BP 136/80 | HR 70 | Ht 71.0 in | Wt 184.2 lb

## 2023-08-13 DIAGNOSIS — Z95810 Presence of automatic (implantable) cardiac defibrillator: Secondary | ICD-10-CM | POA: Diagnosis not present

## 2023-08-13 DIAGNOSIS — I5032 Chronic diastolic (congestive) heart failure: Secondary | ICD-10-CM | POA: Diagnosis not present

## 2023-08-13 DIAGNOSIS — I428 Other cardiomyopathies: Secondary | ICD-10-CM | POA: Diagnosis not present

## 2023-08-13 DIAGNOSIS — I5022 Chronic systolic (congestive) heart failure: Secondary | ICD-10-CM

## 2023-08-13 DIAGNOSIS — E876 Hypokalemia: Secondary | ICD-10-CM

## 2023-08-13 DIAGNOSIS — Z79899 Other long term (current) drug therapy: Secondary | ICD-10-CM

## 2023-08-13 DIAGNOSIS — I442 Atrioventricular block, complete: Secondary | ICD-10-CM | POA: Diagnosis not present

## 2023-08-13 MED ORDER — POTASSIUM CHLORIDE CRYS ER 20 MEQ PO TBCR: 20.0000 meq | EXTENDED_RELEASE_TABLET | Freq: Every day | ORAL | 3 refills | Status: DC

## 2023-08-13 NOTE — Patient Instructions (Addendum)
Medication Instructions:   Your physician recommends that you continue on your current medications as directed. Please refer to the Current Medication list given to you today.  *If you need a refill on your cardiac medications before your next appointment, please call your pharmacy*   Lab Work: . CBC and BMET today  If you have labs (blood work) drawn today and your tests are completely normal, you will receive your results only by: MyChart Message (if you have MyChart) OR A paper copy in the mail If you have any lab test that is abnormal or we need to change your treatment, we will call you to review the results.   Testing/Procedures: None ordered.    Follow-Up: At [facility], you and your health needs are our priority.  As part of our continuing mission to provide you with exceptional heart care, we have created designated Provider Care Teams.  These Care Teams include your primary Cardiologist (physician) and Advanced Practice Providers (APPs -  Physician Assistants and Nurse Practitioners) who all work together to provide you with the care you need, when you need it.  We recommend signing up for the patient portal called "MyChart".  Sign up information is provided on this After Visit Summary.  MyChart is used to connect with patients for Virtual Visits (Telemedicine).  Patients are able to view lab/test results, encounter notes, upcoming appointments, etc.  Non-urgent messages can be sent to your provider as well.   To learn more about what you can do with MyChart, go to [email or web address].    Your next appointment:   12 months with Dr KleinMedication Instructions:

## 2023-08-13 NOTE — H&P (View-Only) (Signed)
Patient Care Team: Patient, [name] as PCP - General (General Practice)   HPI  Gabriel Floyd is a 42 y.o. male Seen in followup for St Jude  CRT-D implantation for CHB in the setting of remote cardiac surgery for repair of subaortic membrane,  Possible hypertrophic Cardiomyopathy with significant impairment of LV function (<50%) and severe pulmonary hypertension.    Hospitalized 5//21 for the above, INput from CHF-DB for pulm HTN and treated on milrinone, with PAP 132>>69 and started on sildenafil   Not seen in > 2years Denies SOB or chest pain No edema or palpitations.   DATE TEST EF   5/21 cMRI 38 % Severe LVE LGE neg  5/21* Echo  45 % LVH 14mm  5/22 Echo  25-30%    Date Cr K Hgb  5/21 1.31 4.3 15.8            Thromboembolic risk factors (  CHF-1) for a CHADSVASc Score of >=1   No discussions at this point following his most recent echo..   Past Medical History:  Diagnosis Date   CHF (congestive heart failure) (HCC)    Heart failure (HCC)    Pulmonary hyperinflation     Past Surgical History:  Procedure Laterality Date   BIV ICD INSERTION CRT-D N/A 04/25/2020   Procedure: BIV ICD INSERTION CRT-D;  Surgeon: Duke Salvia, MD;  Location: [facility] INVASIVE CV LAB;  Service: Cardiovascular;  Laterality: N/A;   COARCTATION OF AORTA REPAIR     RIGHT HEART CATH N/A 04/18/2020   Procedure: RIGHT HEART CATH;  Surgeon: Dolores Patty, MD;  Location: [facility] INVASIVE CV LAB;  Service: Cardiovascular;  Laterality: N/A;   RIGHT HEART CATH N/A 04/23/2020   Procedure: RIGHT HEART CATH;  Surgeon: Dolores Patty, MD;  Location: [facility] INVASIVE CV LAB;  Service: Cardiovascular;  Laterality: N/A;   RIGHT/LEFT HEART CATH AND CORONARY ANGIOGRAPHY N/A 09/27/2021   Procedure: RIGHT/LEFT HEART CATH AND CORONARY ANGIOGRAPHY;  Surgeon: Dolores Patty, MD;  Location: [facility] INVASIVE CV LAB;  Service: Cardiovascular;  Laterality: N/A;    Current Meds  Medication Sig   apixaban  (ELIQUIS) 5 MG TABS tablet Take 1 tablet (5 mg total) by mouth 2 (two) times daily.   atorvastatin (LIPITOR) 40 MG tablet Take 40 mg by mouth at bedtime.   dapagliflozin propanediol (FARXIGA) 5 MG TABS tablet Take 2 tablets by mouth daily.   metFORMIN (GLUCOPHAGE) 1000 MG tablet Take 1,000 mg by mouth 2 (two) times daily.   potassium chloride SA (KLOR-CON M20) 20 MEQ tablet Take 1 tablet (20 mEq total) by mouth daily. NEEDS FOLLOW UP APPOINTMENT FOR MORE REFILLS   sildenafil (REVATIO) 20 MG tablet TAKE 3 TABLETS BY MOUTH THREE TIMES DAILY   spironolactone (ALDACTONE) 25 MG tablet TAKE 1 TABLET BY MOUTH ONCE DAILY . APPOINTMENT REQUIRED FOR FUTURE REFILLS    No Known Allergies    Review of Systems negative except from HPI and PMH  Physical Exam BP 136/80 (BP Location: Left Arm, Patient Position: Sitting, Cuff Size: Large)   Pulse 70   Ht 5\' 11"  (1.803 m)   Wt 184 lb 3.2 oz (83.6 kg)   SpO2 98%   BMI 25.69 kg/m  Well developed and well nourished in no acute distress HENT normal Neck supple with JVP-flat Clear with honking inspiratory expiratory noises left posterior lung Device pocket well healed; without hematoma or erythema.  There is no tethering  Regular rate and rhythm, no  murmur Abd-soft with active BS No Clubbing cyanosis  edema Skin-warm and dry A & Oriented  Grossly normal sensory and motor function  ECG afib neq QRS lead1 and upright QRS V1   Device function is normal. Programming changes increase outputs See Paceart for details    CrCl cannot be calculated (Patient's most recent lab result is older than the maximum 21 days allowed.).   Assessment and  Plan  Cardiomyopathy  CRT D  St Jude   Complete heart block     Pulmonary hypertension  Obesity  HFrEF  Hypokalemia  Atrial fibrillation persistent    This cardiomyopathy, currently not on losartan.  Will check his metabolic profile today as the last one had hypokalemia.  Atrial fibrillation is  persistent.  He is started on anticoagulation.  He will be cardioverted once insurance coverage is clarified and this is reasonable as he is asymptomatic at least in the short term  Euvolemic  continue Guideline directed medical therapy            Current medicines are reviewed at length with the patient today .  The patient does not  have concerns regarding medicines.

## 2023-08-13 NOTE — Progress Notes (Signed)
Patient Care Team: Patient, [name] as PCP - General (General Practice)   HPI  Gabriel Floyd is a 42 y.o. male Seen in followup for St Jude  CRT-D implantation for CHB in the setting of remote cardiac surgery for repair of subaortic membrane,  Possible hypertrophic Cardiomyopathy with significant impairment of LV function (<50%) and severe pulmonary hypertension.    Hospitalized 5//21 for the above, INput from CHF-DB for pulm HTN and treated on milrinone, with PAP 132>>69 and started on sildenafil   Not seen in > 2years Denies SOB or chest pain No edema or palpitations.   DATE TEST EF   5/21 cMRI 38 % Severe LVE LGE neg  5/21* Echo  45 % LVH 14mm  5/22 Echo  25-30%    Date Cr K Hgb  5/21 1.31 4.3 15.8            Thromboembolic risk factors (  CHF-1) for a CHADSVASc Score of >=1   No discussions at this point following his most recent echo..   Past Medical History:  Diagnosis Date   CHF (congestive heart failure) (HCC)    Heart failure (HCC)    Pulmonary hyperinflation     Past Surgical History:  Procedure Laterality Date   BIV ICD INSERTION CRT-D N/A 04/25/2020   Procedure: BIV ICD INSERTION CRT-D;  Surgeon: Duke Salvia, MD;  Location: [facility] INVASIVE CV LAB;  Service: Cardiovascular;  Laterality: N/A;   COARCTATION OF AORTA REPAIR     RIGHT HEART CATH N/A 04/18/2020   Procedure: RIGHT HEART CATH;  Surgeon: Dolores Patty, MD;  Location: [facility] INVASIVE CV LAB;  Service: Cardiovascular;  Laterality: N/A;   RIGHT HEART CATH N/A 04/23/2020   Procedure: RIGHT HEART CATH;  Surgeon: Dolores Patty, MD;  Location: [facility] INVASIVE CV LAB;  Service: Cardiovascular;  Laterality: N/A;   RIGHT/LEFT HEART CATH AND CORONARY ANGIOGRAPHY N/A 09/27/2021   Procedure: RIGHT/LEFT HEART CATH AND CORONARY ANGIOGRAPHY;  Surgeon: Dolores Patty, MD;  Location: [facility] INVASIVE CV LAB;  Service: Cardiovascular;  Laterality: N/A;    Current Meds  Medication Sig   apixaban  (ELIQUIS) 5 MG TABS tablet Take 1 tablet (5 mg total) by mouth 2 (two) times daily.   atorvastatin (LIPITOR) 40 MG tablet Take 40 mg by mouth at bedtime.   dapagliflozin propanediol (FARXIGA) 5 MG TABS tablet Take 2 tablets by mouth daily.   metFORMIN (GLUCOPHAGE) 1000 MG tablet Take 1,000 mg by mouth 2 (two) times daily.   potassium chloride SA (KLOR-CON M20) 20 MEQ tablet Take 1 tablet (20 mEq total) by mouth daily. NEEDS FOLLOW UP APPOINTMENT FOR MORE REFILLS   sildenafil (REVATIO) 20 MG tablet TAKE 3 TABLETS BY MOUTH THREE TIMES DAILY   spironolactone (ALDACTONE) 25 MG tablet TAKE 1 TABLET BY MOUTH ONCE DAILY . APPOINTMENT REQUIRED FOR FUTURE REFILLS    No Known Allergies    Review of Systems negative except from HPI and PMH  Physical Exam BP 136/80 (BP Location: Left Arm, Patient Position: Sitting, Cuff Size: Large)   Pulse 70   Ht 5\' 11"  (1.803 m)   Wt 184 lb 3.2 oz (83.6 kg)   SpO2 98%   BMI 25.69 kg/m  Well developed and well nourished in no acute distress HENT normal Neck supple with JVP-flat Clear with honking inspiratory expiratory noises left posterior lung Device pocket well healed; without hematoma or erythema.  There is no tethering  Regular rate and rhythm, no  murmur Abd-soft with active BS No Clubbing cyanosis  edema Skin-warm and dry A & Oriented  Grossly normal sensory and motor function  ECG afib neq QRS lead1 and upright QRS V1   Device function is normal. Programming changes increase outputs See Paceart for details    CrCl cannot be calculated (Patient's most recent lab result is older than the maximum 21 days allowed.).   Assessment and  Plan  Cardiomyopathy  CRT D  St Jude   Complete heart block     Pulmonary hypertension  Obesity  HFrEF  Hypokalemia  Atrial fibrillation persistent    This cardiomyopathy, currently not on losartan.  Will check his metabolic profile today as the last one had hypokalemia.  Atrial fibrillation is  persistent.  He is started on anticoagulation.  He will be cardioverted once insurance coverage is clarified and this is reasonable as he is asymptomatic at least in the short term  Euvolemic  continue Guideline directed medical therapy            Current medicines are reviewed at length with the patient today .  The patient does not  have concerns regarding medicines.

## 2023-08-14 ENCOUNTER — Other Ambulatory Visit (HOSPITAL_COMMUNITY): Payer: Self-pay | Admitting: *Deleted

## 2023-08-14 ENCOUNTER — Telehealth (HOSPITAL_BASED_OUTPATIENT_CLINIC_OR_DEPARTMENT_OTHER): Payer: Self-pay | Admitting: Licensed Clinical Social Worker

## 2023-08-14 LAB — CBC
Hematocrit: 49.4 % (ref 37.5–51.0)
Hemoglobin: 16.2 g/dL (ref 13.0–17.7)
MCH: 29.1 pg (ref 26.6–33.0)
MCHC: 32.8 g/dL (ref 31.5–35.7)
MCV: 89 fL (ref 79–97)
Platelets: 245 10*3/uL (ref 150–450)
RBC: 5.57 x10E6/uL (ref 4.14–5.80)
RDW: 13.7 % (ref 11.6–15.4)
WBC: 9 10*3/uL (ref 3.4–10.8)

## 2023-08-14 LAB — BASIC METABOLIC PANEL
BUN/Creatinine Ratio: 28 — ABNORMAL HIGH (ref 9–20)
BUN: 30 mg/dL — ABNORMAL HIGH (ref 6–24)
CO2: 26 mmol/L (ref 20–29)
Calcium: 9.8 mg/dL (ref 8.7–10.2)
Chloride: 92 mmol/L — ABNORMAL LOW (ref 96–106)
Creatinine, Ser: 1.09 mg/dL (ref 0.76–1.27)
Glucose: 494 mg/dL — ABNORMAL HIGH (ref 70–99)
Potassium: 4.3 mmol/L (ref 3.5–5.2)
Sodium: 130 mmol/L — ABNORMAL LOW (ref 134–144)
eGFR: 88 mL/min/{1.73_m2} (ref >59)

## 2023-08-14 MED ORDER — APIXABAN 5 MG PO TABS: 5.0000 mg | ORAL_TABLET | Freq: Two times a day (BID) | ORAL | 6 refills | Status: AC

## 2023-08-14 NOTE — Telephone Encounter (Signed)
H&V Care Navigation CSW Progress Note  Clinical Social Worker  contacted [employer] patient assistance program to review eligibility for Eliquis b/c pt has still not received it despite being approved earlier this month  and confirming home address with company. Representative with assistance program states that pt is ineligible due to income- the information she has and his tax return differ. Offered to send his tax return and securely sent that to [phone].   This morning when I checked it had been received. LCSW then called [employer] to check on the status of that patient assistance application. I was updated by social work team that they were able to locate [employer] current for pt. Therefore pt ineligible for assistance at this time. Our team as well as Afib team had spoken with pt about insurance and he had not shared his enrollment in [place] at that time.   I called pt with assistance of Spanish language interpreter Rolm Gala [phone], he confirms he has insurance (unclear when enrolled). Okay with [place] sending prescriptions to pharmacy [facility]). I have asked for an email address to enroll pt with copay cards.   This was relayed to Mindi Junker, RN with Dr. Odessa Fleming team and Kennyth Arnold, RN with Afib clinic. Also have messaged LCSW at [facility] clinic since pt has f/u with their team.   Patient is participating in a Managed Medicaid Plan:  No, [employer] commercial plan only  SDOH Screenings   Food Insecurity: No Food Insecurity (07/27/2023)  Housing: Low Risk  (07/27/2023)  Transportation Needs: No Transportation Needs (07/27/2023)  Utilities: Not At Risk (07/27/2023)  Financial Resource Strain: Low Risk  (07/27/2023)  Physical Activity: Not at Risk (10/30/2022)   Received from [place], [place]  Social Connections: Not at Risk (10/30/2022)   Received from [facility]  Stress: Not at Risk (10/30/2022)   Received from [place], [place]  Tobacco Use: Low Risk  (08/13/2023)  Health Literacy: Adequate Health  Literacy (07/27/2023)    Octavio Graves, MSW, LCSW Clinical Social Worker II [facility] Heart/Vascular Care Navigation  [phone]- work cell phone (preferred) [phone]- desk phone

## 2023-08-14 NOTE — Progress Notes (Signed)
Advanced Heart Failure Clinic Note   PCP: Mallory Shirk Rosalita Levan) HF Cardiologist: Dr. Gala Romney  EP: Dr. Graciela Husbands   HPI:  Gabriel Floyd is a 42 y.o. 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 Admitted by EP. Had ECHO and cMRI elevated PA pressures. Referred to HF Team for further evaluation. Had RHC with elevated PA pressures and volume overload with PVR 17. Placed on milrinone to support RV + sildenafil. Underwent massive diuresis. Prior to d/c had repeat RHC with PVR down to 3.8 and optimized for HF. Plans to possibly add macitentan as an outpatient. Milrinone was gradaully weaned off.   Once optimized, EP placed ST Jude dual chamber CRT-D. On the day of discharge EKG showed A sensed V paced at 62 bpm with QRS 162.   Echo 5/22: EF 25-30% mild RV HK Mild septal flattening TR not significant to estimate RVSP   R/L Cath 10/22: Normal coronaries EF 25%. Severe narrowing of R brachial artery prohibiting R radial approach. Dilated thoracic aorta with inability to access ascending aorta from left radial approach  Ao =  99/58 (73) LV = 97/8 RA = 3 RV = 42/4 PA = 43/23 (30)  PCW = 14 Fick cardiac output/index = 5.4/2.5 PVR = 3.0 WU SVR = 1036 FA sat = 99% PA sat = 70% 71% SVC sat = 66%  Last seen 2/22.  Seen in AF clinic 8/24, found to be in AF. Started on Eliquis, plan for DCCV  after he obtains insurance.  Today he returns for HF follow up, here with interpretor and his sister. Last seen 2/22. Overall feeling fine. He is not SOB with activity. He works full time as a Education administrator, 6 days a week. Has some swelling in left foot. Denies palpitations, abnormal bleeding, CP, dizziness, edema, or PND/Orthopnea. Appetite ok. No fever or chills. He does not weigh at home. Taking all medications. He does not snore. No ETOH use/no tobacco use, no drugs.   Echo today, official results pending, but EF appears similar to prior at 25-30%.  Cardiac studies:  - R/LHC (10/22): normal cors, EF 25%, mild pul htn, normal CO with no evidence of shunting.  - Echo (5/22): EF 25-30%, mild RV, HK Mild septal flattening TR not significant to estimate RVSP  - RHC 04/23/20  On milrinone 0.125 mcg/kg/min and sildenafil 40 tid  RA = 3 RV =69/4 PA = 68/23 (41) PCW = 22 Fick cardiac output/index = 5.1/2.3 PVR = 3.8 WU Ao sat = 99% PA sat = 68%, 70%  Assessment: 1. Much improved hemodynamics 2. Now with moderate mixed PH   - RHC on 04/18/20 RA = 15 prominent v-waves RV = 132/17 PA =  132/52 (77) PCW = 31 Fick cardiac output/index = 2.7/1.35 PVR = 17.3 WU FA sat = 99% PA sat = 49%, 51% SVC sat = 54%   1. Very severe mixed pulmonary HTN 2. Severely reduced CO in setting  of cor pulmonale  3. No evidence of intracardiac shunting  - Echo (9/21): EF 30-35% Grade 3DD  RV ok Very mild AS  Review of systems complete and found to be negative unless listed in HPI.    Past Medical History:  Diagnosis Date   CHF (congestive heart failure) (HCC)    Heart failure (HCC)    Pulmonary hyperinflation    Current Outpatient Medications  Medication Sig Dispense Refill   apixaban (ELIQUIS) 5 MG TABS tablet Take 1 tablet (5 mg total) by mouth 2 (two) times daily. 60 tablet 6   atorvastatin (LIPITOR) 40 MG tablet Take 40 mg by mouth at bedtime.     dapagliflozin propanediol (FARXIGA) 5 MG TABS  tablet Take 2 tablets by mouth daily.     ibuprofen (ADVIL) 200 MG tablet Take 200-400 mg by mouth every 6 (six) hours as needed for moderate pain or headache.     losartan (COZAAR) 25 MG tablet Take 1 tablet (25 mg total) by mouth at bedtime. 90 tablet 3   metFORMIN (GLUCOPHAGE) 1000 MG tablet Take 1,000 mg by mouth 2 (two) times daily.     potassium chloride SA (KLOR-CON M20) 20 MEQ tablet Take 1 tablet (20 mEq total) by mouth daily. 90 tablet 3   sildenafil (REVATIO) 20 MG tablet TAKE 3 TABLETS BY MOUTH THREE TIMES DAILY 270 tablet 0   spironolactone (ALDACTONE) 25 MG tablet TAKE 1 TABLET BY MOUTH ONCE DAILY . APPOINTMENT REQUIRED FOR FUTURE REFILLS 90 tablet 0   torsemide (DEMADEX) 20 MG tablet Patient takes 2 tablets by mouth in the morning.     No current facility-administered medications for this encounter.   No Known Allergies  Social History   Socioeconomic History   Marital status: Single    Spouse name: Not on file   Number of children: Not on file   Years of education: Not on file   Highest education level: Not on file  Occupational History   Not on file  Tobacco Use   Smoking status: Never   Smokeless tobacco: Never  Vaping Use   Vaping status: Never Used  Substance and Sexual Activity   Alcohol use: No   Drug use: No   Sexual activity: Not on file  Other Topics Concern   Not on file  Social History Narrative   Not on file   Social Determinants of Health   Financial Resource Strain: Low Risk  (07/27/2023)   Overall Financial Resource Strain (CARDIA)    Difficulty of Paying Living Expenses: Not very hard  Food Insecurity: No Food Insecurity (07/27/2023)   Hunger Vital Sign    Worried About Running Out of Food in the Last Year: Never true    Ran Out of Food in the Last Year: Never true  Transportation Needs: No Transportation Needs (07/27/2023)   PRAPARE - Administrator, Civil Service (Medical): No    Lack of Transportation (Non-Medical): No   Physical Activity: Not at Risk (10/30/2022)   Received from [place], [place]   Physical Activity    Physical Activity: 1  Stress: Not at Risk (10/30/2022)   Received from [facility], [place]   Stress    Stress: 1  Social Connections: Not at Risk (10/30/2022)   Received from [facility]   Social Connections    Connectedness: 1  Intimate Partner Violence: Not on file   Family History  Problem Relation Age of Onset   Healthy Mother    Healthy Father  BP 90/64   Pulse 69   Wt 82.4 kg (181 lb 9.6 oz)   SpO2 95%   BMI 25.33 kg/m   Wt Readings from Last 3 Encounters:  08/17/23 82.4 kg (181 lb 9.6 oz)  08/13/23 83.6 kg (184 lb 3.2 oz)  07/27/23 85.4 kg (188 lb 3.2 oz)   PHYSICAL EXAM: General:  NAD. No resp difficulty, walked into clinic HEENT: Normal Neck: Supple. No JVD. Carotids 2+ bilat; no bruits. No lymphadenopathy or thryomegaly appreciated. Cor: PMI nondisplaced. Irregular rate & rhythm. No rubs, gallops or murmurs. Lungs: Clear Abdomen: Soft, nontender, nondistended. No hepatosplenomegaly. No bruits or masses. Good bowel sounds. Extremities: No cyanosis, clubbing, rash, edema Neuro: Alert & oriented x 3, cranial nerves grossly intact. Moves all 4 extremities w/o difficulty. Affect pleasant.  Device interrogation (personally reviewed): CorVue stable, 97% BiV pacing, no VT, 100% AF  ASSESSMENT & PLAN: 1. Chronic biventricular systolic HF - Echo 5/22: EF 25-30% mild RV HK Mild septal flattening TR not significant to estimate RVSP - Etiology remains unclear. There is no evidence of residual shunt or infiltrative process on cMRI/MRA or RHC. No scar - s/p STJ CRT-D in setting of CHB - Echo today 08/17/23, official results pending. - NYHA I, volume stable today. - Continue spiro 25 mg daily - Continue Farxiga 10 mg daily - Continue torsemide 40 mg daily + 20 KCL daily. - Continue losartan 25 mg daily - BP too low to add b-blocker. - Labs today.  - Refill all cardiac meds  2.  Pulmonary HTN - Possible WHO Group I (congenital heart disease) +/-  II - Initial RHC 04/18/20 c/w very severe mixed pulmonary HTN, severely reduced CO in setting of cor pulmonale and no evidence of intracardiac shunting - Repeat RHC 04/23/20 on milrinone + sildenafil with much improved hemodynamics. - RHC 10/22 with marked improvement  - V/Q scan 5/22 negative for PE  - Autoimmune serologies negative  - Doing well on sildenafil 60 tid - NYHA Class I. Volume status stable.  3. AF - Newly diagnosed 07/2023. - CHA2DS2-VASc Score of 2.  - Rate controlled - Followed by Dr. Graciela Husbands and AF clinic - Continue Eliquis, no bleeding issues. He has not missed any doses. - EP planning DCCV soon, will reach out to EP office about scheduling.  4. CHB - s/p CRT 5/22, followed by Dr. Graciela Husbands  - No change   5. Congenital heart disease - h/o aortic coarctation s/p surgical repair in 1987 in [place] (age 36) with residual thoracic aortic dialtion on imaging 12/18 - Arrange CT chest to assure stable size   6. DM 2 - Continue Farxiga & metformin   Follow up in 4 months with Dr. Mickle Plumb, FNP 08/17/23

## 2023-08-17 ENCOUNTER — Telehealth: Payer: Self-pay | Admitting: Internal Medicine

## 2023-08-17 ENCOUNTER — Encounter (HOSPITAL_COMMUNITY): Payer: Self-pay

## 2023-08-17 ENCOUNTER — Ambulatory Visit (HOSPITAL_COMMUNITY)
Admission: RE | Admit: 2023-08-17 | Discharge: 2023-08-17 | Disposition: A | Discharge status: Home / Self Care | Payer: Self-pay | Source: Ambulatory Visit | Attending: Internal Medicine | Admitting: Internal Medicine

## 2023-08-17 ENCOUNTER — Ambulatory Visit (HOSPITAL_BASED_OUTPATIENT_CLINIC_OR_DEPARTMENT_OTHER)
Admission: RE | Admit: 2023-08-17 | Discharge: 2023-08-17 | Disposition: A | Discharge status: Home / Self Care | Payer: Commercial Managed Care - HMO | Source: Ambulatory Visit | Attending: Internal Medicine | Admitting: Internal Medicine

## 2023-08-17 VITALS — BP 90/64 | HR 69 | Wt 181.6 lb

## 2023-08-17 DIAGNOSIS — M7989 Other specified soft tissue disorders: Secondary | ICD-10-CM | POA: Diagnosis not present

## 2023-08-17 DIAGNOSIS — I272 Pulmonary hypertension, unspecified: Secondary | ICD-10-CM | POA: Insufficient documentation

## 2023-08-17 DIAGNOSIS — Z7901 Long term (current) use of anticoagulants: Secondary | ICD-10-CM | POA: Diagnosis not present

## 2023-08-17 DIAGNOSIS — E119 Type 2 diabetes mellitus without complications: Secondary | ICD-10-CM | POA: Insufficient documentation

## 2023-08-17 DIAGNOSIS — Q210 Ventricular septal defect: Secondary | ICD-10-CM | POA: Diagnosis not present

## 2023-08-17 DIAGNOSIS — I5022 Chronic systolic (congestive) heart failure: Secondary | ICD-10-CM

## 2023-08-17 DIAGNOSIS — Z79899 Other long term (current) drug therapy: Secondary | ICD-10-CM | POA: Insufficient documentation

## 2023-08-17 DIAGNOSIS — I351 Nonrheumatic aortic (valve) insufficiency: Secondary | ICD-10-CM | POA: Diagnosis not present

## 2023-08-17 DIAGNOSIS — I110 Hypertensive heart disease with heart failure: Secondary | ICD-10-CM | POA: Insufficient documentation

## 2023-08-17 DIAGNOSIS — I712 Thoracic aortic aneurysm, without rupture, unspecified: Secondary | ICD-10-CM

## 2023-08-17 DIAGNOSIS — I422 Other hypertrophic cardiomyopathy: Secondary | ICD-10-CM | POA: Diagnosis not present

## 2023-08-17 DIAGNOSIS — Z8774 Personal history of (corrected) congenital malformations of heart and circulatory system: Secondary | ICD-10-CM | POA: Diagnosis not present

## 2023-08-17 DIAGNOSIS — I371 Nonrheumatic pulmonary valve insufficiency: Secondary | ICD-10-CM | POA: Insufficient documentation

## 2023-08-17 DIAGNOSIS — Z7984 Long term (current) use of oral hypoglycemic drugs: Secondary | ICD-10-CM | POA: Insufficient documentation

## 2023-08-17 DIAGNOSIS — I442 Atrioventricular block, complete: Secondary | ICD-10-CM

## 2023-08-17 DIAGNOSIS — I4819 Other persistent atrial fibrillation: Secondary | ICD-10-CM | POA: Diagnosis not present

## 2023-08-17 LAB — ECHOCARDIOGRAM COMPLETE
Area-P 1/2: 4.15 cm2
Est EF: 25
P 1/2 time: 559 ms
S' Lateral: 4.6 cm
Single Plane A4C EF: 25.7 %

## 2023-08-17 MED ORDER — SILDENAFIL CITRATE 20 MG PO TABS: 60.0000 mg | ORAL_TABLET | Freq: Three times a day (TID) | ORAL | 3 refills | Status: DC

## 2023-08-17 MED ORDER — TORSEMIDE 20 MG PO TABS: 40.0000 mg | ORAL_TABLET | Freq: Every day | ORAL | 3 refills | Status: DC

## 2023-08-17 MED ORDER — ATORVASTATIN CALCIUM 40 MG PO TABS: 40.0000 mg | ORAL_TABLET | Freq: Every day | ORAL | 3 refills | Status: AC

## 2023-08-17 MED ORDER — DAPAGLIFLOZIN PROPANEDIOL 5 MG PO TABS: 10.0000 mg | ORAL_TABLET | Freq: Every day | ORAL | 3 refills | Status: DC

## 2023-08-17 MED ORDER — SPIRONOLACTONE 25 MG PO TABS: 25.0000 mg | ORAL_TABLET | Freq: Every day | ORAL | 3 refills | Status: DC

## 2023-08-17 NOTE — Progress Notes (Signed)
  Echocardiogram 2D Echocardiogram has been performed.  Delcie Roch 08/17/2023, 8:57 AM

## 2023-08-17 NOTE — Telephone Encounter (Signed)
[facility] called stating they confirm patient's insurance and can be scheduled for his cardioversion.  They saw on Dr. Odessa Fleming note from 9/12 he was waiting on to be clarified.

## 2023-08-17 NOTE — Patient Instructions (Addendum)
We have ordered a CT of chest for you. Once we confirm insurance approval, we will call to schedule. Refills sent on all your heart medications - see below. Return to see Dr. Gala Romney in 4 months. We will call and schedule this appointment.  We will reach out to Dr. Odessa Fleming office and confirm your insurance coverage. His office should contact you to schedule your heart cardioversion. If you don't hear from them, please call his office to schedule. Please call us at [phone] if any questions or concerns prior to your next visit.    1. Le hemos ordenado una tomografa computarizada de trax. Una vez que confirmemos la aprobacin del seguro, lo llamaremos para Charity fundraiser. 2. Resurtidos enviados de todos sus medicamentos para el corazn; consulte a continuacin. 3. Volver a ver al Dr. Gala Romney en 4 meses. Llamaremos y programaremos esta cita.  4. Nos comunicaremos con el consultorio del Dr. Graciela Husbands y confirmaremos la cobertura de su seguro. Su oficina debera comunicarse con usted para programar su cardioversin cardaca. Si no tiene noticias suyas, llame a su oficina para programar una cita. 5. Llmenos al [phone] si tiene alguna pregunta o inquietud antes de su prxima visita.

## 2023-08-19 ENCOUNTER — Encounter (HOSPITAL_COMMUNITY): Payer: Self-pay

## 2023-08-19 ENCOUNTER — Ambulatory Visit (HOSPITAL_COMMUNITY): Admit: 2023-08-19 | Payer: Self-pay | Admitting: Cardiovascular Disease

## 2023-08-19 SURGERY — CARDIOVERSION
Anesthesia: Monitor Anesthesia Care

## 2023-08-24 NOTE — Telephone Encounter (Signed)
[facility] called in about pt to get cardioversion scheduled. Please advise.

## 2023-08-26 ENCOUNTER — Ambulatory Visit (HOSPITAL_COMMUNITY): Payer: Commercial Managed Care - HMO

## 2023-08-28 NOTE — Telephone Encounter (Signed)
Spoke with Gabriel Floyd,DPR and advised pt's DCCV has been scheduled for 09/11/2023.  Reviewed instruction letter.  Gabriel Floyd verbalizes understanding and states pt is in [place] today so she will have him pick up a copy of the letter.  She thanked Charity fundraiser for the call.

## 2023-09-10 NOTE — OR Nursing (Signed)
Called patient with pre-procedure instructions for tomorrow.   Patient informed of:   Time to arrive for procedure.0815 Remain NPO past midnight.  Must have a ride home and a responsible adult to remain with them for 24 ours post procedure.  Confirmed blood thinner. Confirmed no breaks in taking blood thinner for 3+ weeks prior to procedure.

## 2023-09-11 ENCOUNTER — Ambulatory Visit (HOSPITAL_BASED_OUTPATIENT_CLINIC_OR_DEPARTMENT_OTHER): Payer: Commercial Managed Care - HMO | Admitting: Anesthesiology

## 2023-09-11 ENCOUNTER — Encounter (HOSPITAL_COMMUNITY)
Admission: RE | Disposition: A | Discharge status: Home / Self Care | Payer: Self-pay | Source: Ambulatory Visit | Attending: Internal Medicine

## 2023-09-11 ENCOUNTER — Other Ambulatory Visit: Payer: Self-pay

## 2023-09-11 ENCOUNTER — Ambulatory Visit (HOSPITAL_COMMUNITY): Payer: Commercial Managed Care - HMO | Admitting: Anesthesiology

## 2023-09-11 ENCOUNTER — Ambulatory Visit (HOSPITAL_COMMUNITY)
Admission: RE | Admit: 2023-09-11 | Discharge: 2023-09-11 | Disposition: A | Discharge status: Home / Self Care | Payer: Commercial Managed Care - HMO | Source: Ambulatory Visit | Attending: Internal Medicine | Admitting: Internal Medicine

## 2023-09-11 DIAGNOSIS — I4819 Other persistent atrial fibrillation: Secondary | ICD-10-CM | POA: Diagnosis present

## 2023-09-11 DIAGNOSIS — Z7901 Long term (current) use of anticoagulants: Secondary | ICD-10-CM | POA: Diagnosis not present

## 2023-09-11 DIAGNOSIS — I429 Cardiomyopathy, unspecified: Secondary | ICD-10-CM | POA: Diagnosis not present

## 2023-09-11 DIAGNOSIS — I442 Atrioventricular block, complete: Secondary | ICD-10-CM | POA: Insufficient documentation

## 2023-09-11 DIAGNOSIS — Z7984 Long term (current) use of oral hypoglycemic drugs: Secondary | ICD-10-CM | POA: Insufficient documentation

## 2023-09-11 DIAGNOSIS — E876 Hypokalemia: Secondary | ICD-10-CM | POA: Diagnosis not present

## 2023-09-11 DIAGNOSIS — Z6825 Body mass index (BMI) 25.0-25.9, adult: Secondary | ICD-10-CM | POA: Insufficient documentation

## 2023-09-11 DIAGNOSIS — I110 Hypertensive heart disease with heart failure: Secondary | ICD-10-CM | POA: Insufficient documentation

## 2023-09-11 DIAGNOSIS — I4891 Unspecified atrial fibrillation: Secondary | ICD-10-CM | POA: Diagnosis not present

## 2023-09-11 DIAGNOSIS — I5022 Chronic systolic (congestive) heart failure: Secondary | ICD-10-CM | POA: Insufficient documentation

## 2023-09-11 DIAGNOSIS — I272 Pulmonary hypertension, unspecified: Secondary | ICD-10-CM | POA: Insufficient documentation

## 2023-09-11 DIAGNOSIS — E1165 Type 2 diabetes mellitus with hyperglycemia: Secondary | ICD-10-CM | POA: Insufficient documentation

## 2023-09-11 DIAGNOSIS — E669 Obesity, unspecified: Secondary | ICD-10-CM | POA: Insufficient documentation

## 2023-09-11 HISTORY — PX: CARDIOVERSION: SHX1299

## 2023-09-11 LAB — GLUCOSE, CAPILLARY
Glucose-Capillary: 525 mg/dL (ref 70–99)
Glucose-Capillary: 455 mg/dL — ABNORMAL HIGH (ref 70–99)

## 2023-09-11 SURGERY — CARDIOVERSION
Anesthesia: Monitor Anesthesia Care

## 2023-09-11 MED ORDER — INSULIN ASPART 100 UNIT/ML IJ SOLN
INTRAMUSCULAR | Status: AC
  Filled 2023-09-11: qty 1

## 2023-09-11 MED ORDER — PROPOFOL 10 MG/ML IV BOLUS
INTRAVENOUS | Status: DC | PRN
  Administered 2023-09-11: 40 mg via INTRAVENOUS
  Administered 2023-09-11: 60 mg via INTRAVENOUS

## 2023-09-11 MED ORDER — LIDOCAINE 2% (20 MG/ML) 5 ML SYRINGE
INTRAMUSCULAR | Status: DC | PRN
  Administered 2023-09-11: 60 mg via INTRAVENOUS

## 2023-09-11 MED ORDER — INSULIN ASPART 100 UNIT/ML IV SOLN: 10.0000 [IU] | Freq: Once | INTRAVENOUS | Status: DC

## 2023-09-11 MED ORDER — INSULIN ASPART 100 UNIT/ML IV SOLN
10.0000 [IU] | Freq: Once | INTRAVENOUS | Status: AC
  Administered 2023-09-11: 10 [IU] via SUBCUTANEOUS

## 2023-09-11 MED ORDER — SODIUM CHLORIDE 0.9 % IV SOLN: INTRAVENOUS | Status: DC

## 2023-09-11 SURGICAL SUPPLY — 1 items: PAD DEFIB RADIO PHYSIO CONN (PAD) ×1 IMPLANT

## 2023-09-11 NOTE — Discharge Instructions (Signed)
 Electrical Cardioversion Electrical cardioversion is the delivery of a jolt of electricity to restore a normal rhythm to the heart. A rhythm that is too fast or is not regular (arrhythmia) keeps the heart from pumping blood well. There is also another type of cardioversion called a chemical (pharmacologic) cardioversion. This is when your health care provider gives you one or more medicines to bring back your regular heart rhythm. Electrical cardioversion is done as a scheduled procedure for arrhythmiasthat are not life-threatening. Electrical cardioversion may also be done in an emergency for sudden life-threatening arrhythmias. Tell a health care provider about: Any allergies you have. All medicines you are taking, including vitamins, herbs, eye drops, creams, and over-the-counter medicines. Any problems you or family members have had with sedatives or anesthesia. Any bleeding problems you have. Any surgeries you have had, including a pacemaker, defibrillator, or other implanted device. Any medical conditions you have. Whether you are pregnant or may be pregnant. What are the risks? Your provider will talk with you about risks. These include: Allergic reactions to medicines. Irritation to the skin on your chest or back where the sticky pads (electrodes) or paddles were put during electrical cardioversion. A blood clot that breaks free and travels to other parts of your body, such as your brain. Return of a worse abnormal heart rhythm that will need to be treated with medicines, a pacemaker, or an implantable cardioverter defibrillator (ICD). What happens before the procedure? Medicines Your provider may give you: Blood-thinning medicines (anticoagulants) so your blood does not clot as easily. If your provider gives you this medicine, you may need to take it for 4 weeks before the procedure. Medicines to help stabilize your heart rate and rhythm. Ask your provider about: Changing or stopping  your regular medicines. These include any diabetes medicines or blood thinners you take. Taking medicines such as aspirin and ibuprofen. These medicines can thin your blood. Do not take them unless your provider tells you to. Taking over-the-counter medicines, vitamins, herbs, and supplements. General instructions Follow instructions from your provider about what you may eat and drink. Do not put any lotions, powders, or ointments on your chest and back for 24 hours before the procedure. They can cause problems with the electrodes or paddles used to deliver electricity to your heart. Do not wear jewelry as this can interfere with delivering electricity to your heart. If you will be going home right after the procedure, plan to have a responsible adult: Take you home from the hospital or clinic. You will not be allowed to drive. Care for you for the time you are told. Tests You may have an exam or testing. This may include: Blood labs. A transesophageal echocardiogram (TEE). What happens during the procedure?     An IV will be inserted into one of your veins. You will be given a sedative. This helps you relax. Electrodes or metal paddles will be placed on your chest. They may be placed in one of these ways: One placed on your right chest, the other on the left ribs. One placed on your chest and the other on your back. An electrical shock will be delivered. The shock briefly stops (resets) your heart rhythm. Your provider will check to see if your heart rhythm is now normal. Some people need only one shock. Some need more to restore a normal heart rhythm. The procedure may vary among providers and hospitals. What happens after the procedure? Your blood pressure, heart rate, breathing rate, and blood oxygen  level will be monitored until you leave the hospital or clinic. Your heart rhythm will be watched to make sure it does not change. This information is not intended to replace advice  given to you by your health care provider. Make sure you discuss any questions you have with your health care provider. Document Revised: 07/10/2022 Document Reviewed: 07/10/2022 Elsevier Patient Education  2024 [employer].

## 2023-09-11 NOTE — Transfer of Care (Signed)
Immediate Anesthesia Transfer of Care Note  Patient: Gabriel Floyd  Procedure(s) Performed: CARDIOVERSION  Patient Location: PACU  Anesthesia Type:MAC  Level of Consciousness: drowsy and patient cooperative  Airway & Oxygen Therapy: Patient Spontanous Breathing and Patient connected to nasal cannula oxygen  Post-op Assessment: Report given to RN and Post -op Vital signs reviewed and stable  Post vital signs: Reviewed and stable  Last Vitals:  Vitals Value Taken Time  BP    Temp    Pulse    Resp    SpO2      Last Pain:  Vitals:   09/11/23 0835  TempSrc: Temporal  PainSc: 0-No pain         Complications: No notable events documented.

## 2023-09-11 NOTE — Progress Notes (Signed)
Dr. Wyline Mood aware.  Blood sugar 455.  NO orders given.  Ok to discharge.  Patient to resume diabetic medications at home (patient had stopped for 2 weeks)

## 2023-09-11 NOTE — Interval H&P Note (Signed)
History and Physical Interval Note:  09/11/2023 8:34 AM  Gabriel Floyd  has presented today for surgery, with the diagnosis of AFIB.  The various methods of treatment have been discussed with the patient and family. After consideration of risks, benefits and other options for treatment, the patient has consented to  Procedure(s): CARDIOVERSION (N/A) as a surgical intervention.  The patient's history has been reviewed, patient examined, no change in status, stable for surgery.  I have reviewed the patient's chart and labs.  Questions were answered to the patient's satisfaction.     Maisie Fus

## 2023-09-11 NOTE — CV Procedure (Addendum)
Procedure: Electrical Cardioversion Indications:  Atrial Fibrillation  Procedure Details:  Consent: Risks of procedure as well as the alternatives and risks of each were explained to the (patient/caregiver).  Consent for procedure obtained.  Time Out: Verified patient identification, verified procedure, site/side was marked, verified correct patient position, special equipment/implants available, medications/allergies/relevent history reviewed, required imaging and test results available. PERFORMED.  Patient placed on cardiac monitor, pulse oximetry, supplemental oxygen as necessary.  Sedation given:  see anesthesia note Pacer pads placed anterior and posterior chest.  Cardioverted 2 time(s).  Cardioversion with synchronized biphasic 360J shock.  Evaluation: Findings: Post procedure EKG shows: sinus rhythm with PACs, V paced. Increased base rate to 70 bpm Complications: None Patient did tolerate procedure well.  Time Spent Directly with the Patient:  15 minutes   Maisie Fus 09/11/2023, 10:09 AM

## 2023-09-11 NOTE — Anesthesia Postprocedure Evaluation (Signed)
Anesthesia Post Note  Patient: Gabriel Floyd  Procedure(s) Performed: CARDIOVERSION     Patient location during evaluation: PACU Anesthesia Type: General Level of consciousness: awake and alert Pain management: pain level controlled Vital Signs Assessment: post-procedure vital signs reviewed and stable Respiratory status: spontaneous breathing, nonlabored ventilation and respiratory function stable Cardiovascular status: blood pressure returned to baseline Postop Assessment: no apparent nausea or vomiting Anesthetic complications: no   No notable events documented.  Last Vitals:  Vitals:   09/11/23 1025 09/11/23 1030  BP: (!) 107/92 (!) 113/90  Pulse: 71 71  Resp: 14 18  Temp:    SpO2: 95% 98%    Last Pain:  Vitals:   09/11/23 1025  TempSrc:   PainSc: 0-No pain                 Shanda Howells

## 2023-09-11 NOTE — Anesthesia Preprocedure Evaluation (Addendum)
Anesthesia Evaluation  Patient identified by MRN, date of birth, ID band Patient awake    Reviewed: Allergy & Precautions, NPO status , Patient's Chart, lab work & pertinent test results  History of Anesthesia Complications Negative for: history of anesthetic complications  Airway Mallampati: II  TM Distance: >3 FB Neck ROM: Full    Dental  (+) Missing,    Pulmonary sleep apnea    Pulmonary exam normal        Cardiovascular +CHF  Normal cardiovascular exam+ dysrhythmias Atrial Fibrillation      Neuro/Psych negative neurological ROS  negative psych ROS   GI/Hepatic negative GI ROS, Neg liver ROS,,,  Endo/Other  diabetes, Poorly Controlled, Type 2    Renal/GU negative Renal ROS  negative genitourinary   Musculoskeletal negative musculoskeletal ROS (+)    Abdominal   Peds  Hematology negative hematology ROS (+)   Anesthesia Other Findings Day of surgery medications reviewed with patient.  Reproductive/Obstetrics negative OB ROS                              Anesthesia Physical Anesthesia Plan  ASA: 4  Anesthesia Plan: General   Post-op Pain Management: Minimal or no pain anticipated   Induction: Intravenous  PONV Risk Score and Plan: Treatment may vary due to age or medical condition and Propofol infusion  Airway Management Planned: Mask  Additional Equipment: None  Intra-op Plan:   Post-operative Plan:   Informed Consent: I have reviewed the patients History and Physical, chart, labs and discussed the procedure including the risks, benefits and alternatives for the proposed anesthesia with the patient or authorized representative who has indicated his/her understanding and acceptance.     Interpreter used for [employer] Discussed with: CRNA  Anesthesia Plan Comments:          Anesthesia Quick Evaluation

## 2023-09-14 ENCOUNTER — Encounter (HOSPITAL_COMMUNITY): Payer: Self-pay | Admitting: Internal Medicine

## 2023-09-15 ENCOUNTER — Telehealth: Payer: Self-pay | Admitting: *Deleted

## 2023-09-15 ENCOUNTER — Encounter: Payer: Self-pay | Admitting: Cardiology

## 2023-09-15 NOTE — Telephone Encounter (Signed)
auth # [id number] exp 03/13/24   Ct angio chest auth

## 2023-09-15 NOTE — Addendum Note (Signed)
Encounter addended by: Noralee Space, RN on: 09/15/2023 9:00 AM  Actions taken: Visit diagnoses modified, Order list changed, Diagnosis association updated

## 2023-09-16 ENCOUNTER — Ambulatory Visit (HOSPITAL_COMMUNITY)
Admission: RE | Admit: 2023-09-16 | Discharge: 2023-09-16 | Disposition: A | Discharge status: Home / Self Care | Payer: Commercial Managed Care - HMO | Source: Ambulatory Visit | Attending: Family Medicine | Admitting: Family Medicine

## 2023-09-16 DIAGNOSIS — Z8774 Personal history of (corrected) congenital malformations of heart and circulatory system: Secondary | ICD-10-CM | POA: Insufficient documentation

## 2023-09-16 DIAGNOSIS — I712 Thoracic aortic aneurysm, without rupture, unspecified: Secondary | ICD-10-CM | POA: Diagnosis present

## 2023-09-16 MED ORDER — IOHEXOL 350 MG/ML SOLN
75.0000 mL | Freq: Once | INTRAVENOUS | Status: AC | PRN
  Administered 2023-09-16: 75 mL via INTRAVENOUS

## 2023-09-21 ENCOUNTER — Telehealth: Payer: Self-pay

## 2023-09-21 NOTE — Telephone Encounter (Signed)
Following alert received from [employer] received for 1 AMS episode on 09/18/23 at 12:49 am of ongoing duration at time of transmission (09/19/23 at 12:05 pm), burden 29% since 09/11/23, V rates are controlled, on Eliquis per Epic. Pt is s/p cardioversion on 09/11/23.  Routing to Dr. Graciela Husbands for review. LW

## 2023-09-23 LAB — CUP PACEART REMOTE DEVICE CHECK
Battery Remaining Longevity: 28 mo
Battery Remaining Percentage: 49 %
Battery Voltage: 2.9 V
Brady Statistic AP VP Percent: 38 %
Brady Statistic AP VS Percent: 1 %
Brady Statistic AS VP Percent: 61 %
Brady Statistic AS VS Percent: 1 %
Brady Statistic RA Percent Paced: 21 %
Date Time Interrogation Session: 20241023103703
HighPow Impedance: 72 Ohm
Implantable Lead Connection Status: 753985
Implantable Lead Connection Status: 753985
Implantable Lead Connection Status: 753985
Implantable Lead Implant Date: 20210526
Implantable Lead Implant Date: 20210526
Implantable Lead Implant Date: 20210526
Implantable Lead Location: 753858
Implantable Lead Location: 753859
Implantable Lead Location: 753860
Implantable Pulse Generator Implant Date: 20210526
Lead Channel Impedance Value: 410 Ohm
Lead Channel Impedance Value: 450 Ohm
Lead Channel Impedance Value: 510 Ohm
Lead Channel Pacing Threshold Amplitude: 0.625 V
Lead Channel Pacing Threshold Amplitude: 0.75 V
Lead Channel Pacing Threshold Amplitude: 2.25 V
Lead Channel Pacing Threshold Pulse Width: 0.5 ms
Lead Channel Pacing Threshold Pulse Width: 0.5 ms
Lead Channel Pacing Threshold Pulse Width: 1 ms
Lead Channel Sensing Intrinsic Amplitude: 12 mV
Lead Channel Sensing Intrinsic Amplitude: 5 mV
Lead Channel Setting Pacing Amplitude: 1.625
Lead Channel Setting Pacing Amplitude: 2.5 V
Lead Channel Setting Pacing Amplitude: 3.25 V
Lead Channel Setting Pacing Pulse Width: 0.5 ms
Lead Channel Setting Pacing Pulse Width: 1 ms
Lead Channel Setting Sensing Sensitivity: 0.5 mV
Pulse Gen Serial Number: 111023761
Zone Setting Status: 755011

## 2023-09-24 ENCOUNTER — Ambulatory Visit (INDEPENDENT_AMBULATORY_CARE_PROVIDER_SITE_OTHER): Payer: Self-pay

## 2023-09-24 DIAGNOSIS — I442 Atrioventricular block, complete: Secondary | ICD-10-CM

## 2023-09-30 NOTE — Telephone Encounter (Signed)
Apparently in admitted [facility] with a stroke

## 2023-10-01 ENCOUNTER — Telehealth (HOSPITAL_COMMUNITY): Payer: Self-pay | Admitting: Cardiology

## 2023-10-01 DIAGNOSIS — R911 Solitary pulmonary nodule: Secondary | ICD-10-CM

## 2023-10-01 NOTE — Telephone Encounter (Signed)
Patient called.  Patient aware via sister

## 2023-10-01 NOTE — Telephone Encounter (Signed)
-----   Message from Arvilla Meres sent at 09/24/2023 10:36 PM EDT ----- Please refer to Dr. Tonia Brooms in Pulmonary for f/u in Pulmonary nodule clinic

## 2023-10-08 NOTE — Progress Notes (Deleted)
Cardiology Office Note:  .   Date:  10/08/2023  ID:  Gabriel Floyd, DOB 1981-05-26, MRN [id number] PCP: Patient, [name]  [facility] Providers Cardiologist: Dr. Gala Romney EP: Dr. Graciela Husbands  History of Present Illness: .   Gabriel Floyd is a 42 y.o. 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 Graciela Husbands 08/13/23, this after a 2 year hiatus from EP, feeling well, pending DCCV, no changes were made  He saw HF team 08/17/23, doing well, working as a Education administrator, was pending DCCV, planned for surveillance CT, no med changes  Successful DCCV 09/11/23   Today's visit is scheduled as a post DCCV visit  ROS: ***  *** eliquis, compliance, bleeding, dose *** burden *** volume  Device information Abbott CRT-D implanted 04/25/2020  Arrhythmia/AAD hx AFib noted Aug 2024 No AAD to date  Studies Reviewed: Marland Kitchen    EKG done today and reviewed by myself:  ***  DEVICE interrogation done today and reviewed by myself *** Battery and lead measurements are good ***   08/17/2023: TTE 1. Global hypokinesis worse in septum and apex. Left ventricular ejection  fraction, by estimation, is 25%. The left ventricle has normal function.  The left ventricle demonstrates global hypokinesis. The left ventricular  internal cavity size was severely   dilated. Left ventricular diastolic parameters are indeterminate.   2. Device leads in RA/RV . Right ventricular systolic function is normal.  The right ventricular size  is normal.   3. Left atrial size was severely dilated.   4. The mitral valve is abnormal. Trivial mitral valve regurgitation. No  evidence of mitral stenosis.   5. The aortic valve is tricuspid. There is mild calcification of the  aortic valve. There is mild thickening of the aortic valve. Aortic valve  regurgitation is moderate. No aortic stenosis is present.   6. The inferior vena cava is normal in size with greater than 50%  respiratory variability, suggesting right atrial pressure of 3 mmHg.    R/L Cath 10/22: Normal coronaries EF 25%. Severe narrowing of R brachial artery prohibiting R radial approach. Dilated thoracic aorta with inability to access ascending aorta from left radial approach   Ao =  99/58 (73) LV = 97/8 RA = 3 RV = 42/4 PA = 43/23 (30)  PCW = 14 Fick cardiac output/index = 5.4/2.5 PVR = 3.0 WU SVR = 1036 FA sat = 99% PA sat = 70% 71% SVC sat = 66%   Risk Assessment/Calculations:    Physical Exam:   VS:  There were no vitals taken for this visit.   Wt Readings from Last 3 Encounters:  09/11/23 180 lb (81.6 kg)  08/17/23 181 lb 9.6 oz (82.4 kg)  08/13/23 184 lb 3.2 oz (83.6 kg)    GEN: Well nourished, well developed in no acute distress NECK: No JVD; No carotid bruits CARDIAC: ***RRR, no murmurs, rubs, gallops RESPIRATORY:  *** CTA b/l without rales, wheezing or rhonchi  ABDOMEN: Soft, non-tender, non-distended EXTREMITIES: *** No edema; No deformity   ICD site: *** is stable, no thinning, fluctuation, tethering  ASSESSMENT AND  PLAN: .    persistent AFib CHA2DS2Vasc is 2, on Eliquis, *** appropriately dosed *** burden  ICD *** intact function *** no programming changes made  NICM Chronic CHF P.HTN *** C/w Dr. Lynne Logan  Secondary hypercoagulable state 2/2 AFib     {Are you ordering a CV Procedure (e.g. stress test, cath, DCCV, TEE, etc)?   Press F2        :[id number]}     Dispo: ***  Signed, [name], PA-C

## 2023-10-09 ENCOUNTER — Ambulatory Visit: Payer: Managed Care, Other (non HMO) | Admitting: Physician Assistant

## 2023-10-13 NOTE — Progress Notes (Signed)
Remote ICD transmission.   

## 2023-11-09 ENCOUNTER — Telehealth (HOSPITAL_COMMUNITY): Payer: Self-pay

## 2023-11-09 NOTE — Telephone Encounter (Signed)
Called to confirm/remind patient of their appointment at the [facility] on 11/10/23.   Patient reminded to bring all medications and/or complete list.  Confirmed patient has transportation. Gave directions, instructed to utilize valet parking.  Confirmed appointment prior to ending call.

## 2023-11-10 ENCOUNTER — Telehealth (HOSPITAL_COMMUNITY): Payer: Self-pay

## 2023-11-10 ENCOUNTER — Ambulatory Visit (HOSPITAL_COMMUNITY)
Admission: RE | Admit: 2023-11-10 | Discharge: 2023-11-10 | Disposition: A | Discharge status: Home / Self Care | Payer: Commercial Managed Care - HMO | Source: Ambulatory Visit | Attending: Cardiology

## 2023-11-10 ENCOUNTER — Encounter (HOSPITAL_COMMUNITY): Payer: Self-pay

## 2023-11-10 VITALS — BP 90/50 | HR 70 | Wt 177.8 lb

## 2023-11-10 DIAGNOSIS — I110 Hypertensive heart disease with heart failure: Secondary | ICD-10-CM | POA: Insufficient documentation

## 2023-11-10 DIAGNOSIS — Z8774 Personal history of (corrected) congenital malformations of heart and circulatory system: Secondary | ICD-10-CM | POA: Diagnosis not present

## 2023-11-10 DIAGNOSIS — I272 Pulmonary hypertension, unspecified: Secondary | ICD-10-CM

## 2023-11-10 DIAGNOSIS — I5032 Chronic diastolic (congestive) heart failure: Secondary | ICD-10-CM | POA: Diagnosis not present

## 2023-11-10 DIAGNOSIS — I63512 Cerebral infarction due to unspecified occlusion or stenosis of left middle cerebral artery: Secondary | ICD-10-CM

## 2023-11-10 DIAGNOSIS — Z86718 Personal history of other venous thrombosis and embolism: Secondary | ICD-10-CM | POA: Insufficient documentation

## 2023-11-10 DIAGNOSIS — Z79899 Other long term (current) drug therapy: Secondary | ICD-10-CM | POA: Diagnosis not present

## 2023-11-10 DIAGNOSIS — Z7984 Long term (current) use of oral hypoglycemic drugs: Secondary | ICD-10-CM | POA: Insufficient documentation

## 2023-11-10 DIAGNOSIS — R531 Weakness: Secondary | ICD-10-CM | POA: Diagnosis not present

## 2023-11-10 DIAGNOSIS — E1165 Type 2 diabetes mellitus with hyperglycemia: Secondary | ICD-10-CM | POA: Diagnosis not present

## 2023-11-10 DIAGNOSIS — Q249 Congenital malformation of heart, unspecified: Secondary | ICD-10-CM

## 2023-11-10 DIAGNOSIS — I442 Atrioventricular block, complete: Secondary | ICD-10-CM

## 2023-11-10 DIAGNOSIS — Z794 Long term (current) use of insulin: Secondary | ICD-10-CM | POA: Diagnosis not present

## 2023-11-10 DIAGNOSIS — I482 Chronic atrial fibrillation, unspecified: Secondary | ICD-10-CM | POA: Diagnosis not present

## 2023-11-10 LAB — BASIC METABOLIC PANEL
Anion gap: 9 (ref 5–15)
BUN: 20 mg/dL (ref 6–20)
CO2: 24 mmol/L (ref 22–32)
Calcium: 9.1 mg/dL (ref 8.9–10.3)
Chloride: 104 mmol/L (ref 98–111)
Creatinine, Ser: 0.9 mg/dL (ref 0.61–1.24)
GFR, Estimated: >60 mL/min (ref >60)
Glucose, Bld: 143 mg/dL — ABNORMAL HIGH (ref 70–99)
Potassium: 4 mmol/L (ref 3.5–5.1)
Sodium: 137 mmol/L (ref 135–145)

## 2023-11-10 LAB — DIGOXIN LEVEL: Digoxin Level: 0.8 ng/mL (ref 0.8–2.0)

## 2023-11-10 LAB — BRAIN NATRIURETIC PEPTIDE: B Natriuretic Peptide: 303.7 pg/mL — ABNORMAL HIGH (ref 0.0–100.0)

## 2023-11-10 MED ORDER — TORSEMIDE 20 MG PO TABS: 60.0000 mg | ORAL_TABLET | Freq: Every day | ORAL | 3 refills | Status: DC

## 2023-11-10 NOTE — Progress Notes (Signed)
[facility] Note   PCP: Mallory Shirk Rosalita Levan) HF Cardiologist: Dr. Gala Romney  EP: Dr. Graciela Husbands  Attending: Dr. Gala Romney  HPI:  [name] is a 42 y.o. 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 Admitted by EP. Had ECHO and cMRI elevated PA pressures. Referred to HF Team for further evaluation. Had RHC with elevated PA pressures and volume overload with PVR 17. Placed on milrinone to support RV + sildenafil. Underwent massive diuresis. Prior to d/c had repeat RHC with PVR down to 3.8 and optimized for HF. Plans to possibly add macitentan as an outpatient. Milrinone was gradaully weaned off.  S/p St. Jude dual chamber CRT-D.   Echo 5/22: EF 25-30% mild RV HK Mild septal flattening TR not significant to estimate RVSP   R/L Cath 10/22: nl coronaries EF 25%. Severe narrowing of R brachial artery prohibiting R radial approach. Dilated thoracic aorta with inability to access ascending aorta from left radial approach.  Last seen 2/22.  Seen in AF clinic 8/24, found to be in AF. Started on Eliquis. Now s/p DCCV 09/11/23. On device check 09/23/23 noted to have 45% AF burden.   Admitted to [facility] 09/22/23 for stroke. Per notes, he had not been on Eliquis at this time. CTA head and neck + for left M1 occlusion with good collaterals. CT perfusion attempted but was  subsequently taken for thrombectomy. He then developed hemorrhagic conversion. Hospital course was complicated by right upper extremity PICC associated DVT, persistent hypotension requiring vasopressor support, aspiration pneumonia, EPEC diarrhea. He was weaned from pressors and diuresed. He underwent RHC and was started on sildenafil and Digoxin. Reccommended discharge to inpatient rehab, however cost was an issue. He elected to be discharged home with PT/OT.  RHC on ~6 NE (11/24): RA 15, PA 82/40 (52), PCWP 27, RVSP 75, CO/CI (thermo): 4.3/2.1  Today he returns for HF follow up with interpreter and family member. Overall feeling good. Has been able to perform ADLs and ambulate without SOB. Denies CP, dizziness, edema, or PND/Orthopnea. Had previously had swelling in L foot although now resolved. Residual deficits in R arm from stroke. Appetite ok. No fever or chills. Taking all medications.   Cardiac studies:  - RHC from [facility] (11/24) on ~6 NE : RA 15, PA 82/40 (52), PCWP 27, RVSP 75, CO/CI (thermo): 4.3/2.1 - Echo (9/24): EF 25%. Sev dilated LV and LA. Triv MR w thickening, Mod AI w calcification. - R/LHC (10/22): normal cors, EF 25%, mild pul htn, normal CO with no evidence of shunting. - Echo (5/22): EF 25-30%, mild RV, HK Mild septal flattening TR not significant to estimate RVSP - RHC (5/21) on Milrinone 0.125 mcg/kg/min + Sildenafil 40 tid : RA 3, PA 68/23 (41), PCWP 22, Fick CO/CI 5.1/2.3, PVR 3.8 W - RHC (5/21): RA 15 v-waves, PA 132/52 (77), PCWP 31, Fick CO/CI 2.7/1.35, PVR 17.3 W - Echo (9/21): EF 30-35% Grade 3DD  RV ok Very mild AS  Review of systems complete and found to be negative unless listed in HPI.    Past Medical History:  Diagnosis Date   CHF (congestive heart failure) (HCC)    Heart failure (HCC)    Pulmonary hyperinflation    Current Outpatient Medications  Medication Sig Dispense Refill   apixaban (ELIQUIS) 5 MG TABS tablet Take 1 tablet (5 mg total) by mouth 2  (two) times daily. 60 tablet 6   aspirin 81 MG chewable tablet Chew 81 mg by mouth daily.     atorvastatin (LIPITOR) 40 MG tablet Take 1 tablet (40 mg total) by mouth at bedtime. 90 tablet 3   dapagliflozin propanediol (FARXIGA) 5 MG TABS tablet Take 2 tablets (10 mg total) by mouth daily. 90 tablet 3   digoxin (LANOXIN) 0.125 MG tablet Take 0.125 mg by mouth daily.     ibuprofen (ADVIL) 200 MG tablet Take 200-400 mg by mouth every 6 (six) hours as needed for moderate pain or headache.     magnesium oxide (MAG-OX) 400 (240 Mg) MG tablet Take 400 mg by mouth daily.     potassium chloride SA (KLOR-CON M20) 20 MEQ tablet Take 1 tablet (20 mEq total) by mouth daily. 90 tablet 3   sildenafil (REVATIO) 20 MG tablet Take 3 tablets (60 mg total) by mouth 3 (three) times daily. 270 tablet 3   torsemide (DEMADEX) 20 MG tablet Take 2 tablets (40 mg total) by mouth daily. Patient takes 2 tablets by mouth in the morning. 180 tablet 3   losartan (COZAAR) 25 MG tablet Take 1 tablet (25 mg total) by mouth at bedtime. (Patient not taking: Reported on 11/10/2023) 90 tablet 3   No current facility-administered medications for this encounter.   No Known Allergies  Social History   Socioeconomic History   Marital status: Single    Spouse name: Not on file   Number of children: Not on file   Years of education: Not on file   Highest education level: Not on file  Occupational History   Not on file  Tobacco Use   Smoking status: Never   Smokeless tobacco: Never  Vaping Use   Vaping status: Never Used  Substance and Sexual Activity   Alcohol use: No   Drug use: No   Sexual activity: Not on file  Other Topics Concern   Not on file  Social History Narrative   Not on file   Social Determinants of Health   Financial Resource Strain: High Risk (10/05/2023)   Received from [facility]   Overall Financial Resource Strain (CARDIA)    Difficulty of Paying Living Expenses: Hard  Food Insecurity: No Food  Insecurity (10/15/2023)   Received from [facility]   Hunger Vital Sign    Worried About Running Out of Food in the Last Year: Never true    Ran Out of Food in the Last Year: Never true  Transportation Needs: No Transportation Needs (10/15/2023)   Received from [facility] - Transportation    Lack of Transportation (Medical): No    Lack of Transportation (Non-Medical): No  Physical Activity: Not on File (10/30/2022)   Received from [facility]   Physical Activity    Physical Activity: 0  Stress: No Stress Concern Present (10/05/2023)   Received from [facility] - Occupational Stress Questionnaire    Feeling of Stress : Not at all  Social Connections: Unknown (09/22/2023)  Received from [facility]   Social Network    Social Network: Not on file  Intimate Partner Violence: Not At Risk (10/05/2023)   Received from [facility]   HITS    Over the last 12 months how often did your partner physically hurt you?: Never    Over the last 12 months how often did your partner insult you or talk down to you?: Never    Over the last 12 months how often did your partner threaten you with physical harm?: Never    Over the last 12 months how often did your partner scream or curse at you?: Never   Family History  Problem Relation Age of Onset   Healthy Mother    Healthy Father    BP (!) 90/50 (BP Location: Left Arm, Patient Position: Sitting)   Pulse 70   Wt 80.6 kg (177 lb 12.8 oz)   SpO2 97%   BMI 25.51 kg/m   Wt Readings from Last 3 Encounters:  11/10/23 80.6 kg (177 lb 12.8 oz)  09/11/23 81.6 kg (180 lb)  08/17/23 82.4 kg (181 lb 9.6 oz)   PHYSICAL EXAM: General:  Well appearing. No resp difficulty. HEENT: normal Neck: supple. JVD to 12cm. Carotids 2+ bilat; no bruits. No lymphadenopathy or thryomegaly appreciated. Cor: PMI nondisplaced. Regular rate & rhythm. No rubs, gallops or murmurs. Lungs: Fine crackles ni  bases Abdomen: soft, nontender, nondistended. No hepatosplenomegaly. No bruits or masses. Good bowel sounds. Extremities: no cyanosis, clubbing, rash, edema. Neuro: alert & oriented x3, cranial nerves grossly intact. R sided weakness. Affect pleasant  Device interrogation (personally reviewed): CorVue stable, 95% BiV pacing, 4.2% A pacing, no VT, 89% AF  ECG (personally reviewed): VP at 70 bpm  ASSESSMENT & PLAN: 1. Chronic biventricular systolic HF - Echo 5/22: EF 25-30% mild RV HK Mild septal flattening TR not significant to estimate RVSP - Etiology remains unclear. There is no evidence of residual shunt or infiltrative process on cMRI/MRA or RHC. No scar - s/p STJ CRT-D in setting of CHB - Echo 9/24: EF 25%. RV nl. Sev dilated LV and LA. Triv MR w thickening, Mod AI w calcification. - RHC from [facility] (11/24) on ~6 NE : RA 15, PA 82/40 (52), PCWP 27, RVSP 75, CO/CI (thermo): 4.3/2.1 - NYHA I. Volume up on exam. Pro-BNP (11/06/23) 2.439. BNP today. - Off Spiro, Losartan. No BP room to re-initiate GDMT. - Continue Digoxin. Check dig level today. - Stop Marcelline Deist with severely uncontrolled DM.  - Increase torsemide 60 mg daily + 20 KCL daily. - BMET/BNP today.   2. Pulmonary HTN - Possible WHO Group I (congenital heart disease) +/-  II - Initial RHC 04/18/20 c/w very severe mixed pulmonary HTN, severely reduced CO in setting of cor pulmonale and no evidence of intracardiac shunting - RHC (5/21) on milrinone + sildenafil with much improved hemodynamics. - RHC (10/22) with marked improvement . V/Q scan 5/22 negative for PE  - RHC from [facility] (11/24) on ~6 NE : RA 15, PA 82/40 (52) - Autoimmune serologies negative  - On Sildenafil 60 tid. Soft BP, asymptomatic. Management per [facility].  3. CVA  - CTA head and neck + for left M1 occlusion with good collaterals. Attempted thrombectomy, but had hemmorragic conversion - Per notes, had been off Eliquis since DCCV - R side arm  residual weakness - Continue Eliquis 5mg  bid - Continue ASA and statin  4. Chronic AF - Newly diagnosed 07/2023 - CHA2DS2-VASc Score  now 4 - s/p DCCV 10/24. Followed by Dr. Graciela Husbands and AF clinic - Continue Eliquis - Device interrogation today with 89% AF burden - ECG today VP 70 bpm  5. CHB - s/p CRT-D 5/22, followed by Dr. Graciela Husbands   6. Congenital heart disease - h/o aortic coarctation s/p surgical repair in 1987 in [place] (age 62) with residual thoracic aortic dialtion on imaging 12/18 - Arrange CT chest to assure stable size   7. Severely uncontrolled Diabetes Type 2 - Hgb A1C 19.9 (10/24) - Stop Farxiga  - On Insulin   Follow up in 3 weeks with PharmD Follow up in 6 weeks with PA/NP  Follow up in 3 months with Dr. Bensimhon  [place] Estefania Kamiya, NP 11/10/23

## 2023-11-10 NOTE — Patient Instructions (Addendum)
PARAR Farxiga  DETENER el ibuprofeno  AUMENTAR la torsemida a 60 mg al [employer].  Los anlisis realizados hoy, sus resultados estarn disponibles en MyChart, nos comunicaremos con usted si hay lecturas anormales.  Haga un seguimiento con nuestro farmacutico de insuficiencia cardaca en 3 semanas.  Su mdico recomienda que programe una cita de seguimiento en: 6 semanas con la enfermera especializada y en 3 meses con el Dr. Gala Floyd (marzo de 2025) ** LLAME A LA OFICINA EN ENERO PARA COORDINAR SU CITA DE SEGUIMIENTO.**  Si tiene alguna pregunta o inquietud antes de su prxima cita, envenos un mensaje a travs de mychart o llame a nuestra oficina al [phone].    PARA DEJAR UN MENSAJE A LA ENFERMERA, SELECCIONE LA OPCIN 2, DEJE UN MENSAJE QUE INCLUYA:  SU NOMBRE  FECHA DE NACIMIENTO  NMERO DE DEVOLUCIN DE LLAMADA  MOTIVO DE LA LLAMADA**esto es importante ya que priorizamos las devoluciones de llamadas  RECIBIRS UNA LLAMADA EL MISMO DA SIEMPRE QUE LLAME ANTES DE LAS 4:00 PM  En la [place] de [address], usted y sus necesidades de salud son Ferne Coe prioridad. Como parte de nuestra misin continua de brindarle una atencin cardaca excepcional, hemos creado equipos de atencin de proveedores designados. Estos equipos de atencin incluyen a su cardilogo principal (mdico) y proveedores de Cabin crew (APP: asistentes mdicos y enfermeras practicantes), quienes trabajan juntos para brindarle la atencin que necesita, cuando la necesita.   Puede consultar a cualquiera de los siguientes proveedores en su equipo de atencin designado en su prximo seguimiento:  Dr. Arvilla Meres  Dr. Marca Ancona  Dr. Dorthula Nettles  Dr. Valetta Mole, NP  Brittainy Sharol Harness, [place]  Prince Rome, NP  Anna Genre, [place]  Alma Daz, NP  [place] Lee, NP  Karle Plumber, doctora en farmacia   Asegrese de traer todos los frascos de sus  medicamentos a cada cita.    Gracias por elegir [facility]

## 2023-11-10 NOTE — Telephone Encounter (Signed)
Called patient at home using translator phone for Spanish. ID [id number] assisted to verify that patient is taking nightly lantus insulin as directed.

## 2023-11-10 NOTE — Addendum Note (Signed)
Encounter addended by: Suezanne Cheshire, RN on: 11/10/2023 4:23 PM  Actions taken: Order Reconciliation Section accessed, Medication List reviewed, Home Medications modified

## 2023-11-19 NOTE — Progress Notes (Signed)
 Advanced Heart Failure Clinic Note   PCP: Lizette Jennye) HF Cardiologist: Dr. Cherrie  EP: Dr. Fernande  Attending: Dr. Cherrie  HPI:  Gabriel Floyd is a 42 y.o. male with a complicated PMHx including: aortic coarctation, s/p surgical repair 1987 in [place] (age 55), with residual ascending/descending thoracic aortic dilation on imaging 10/2017, and moderate AI - Ventricular septal defect (small by notes) and subaortic membrane, also repaired in 1987 - HFpEF, with hypertrophic cardiomyopathy, denies history of hypertension - Diabetes  - Mixed obstructive/restrictive lung disease by PFTs 01/2018  FEV1 1.71 (40%) FVC 2.51 (48%) DLCO 21.5 (64%)    He was previously followed by Pulmonary at [facility]   Admitted to [facility] 03/2020 for acute cholecystitis and found to be in CHB=>>transfered to [facility] for potential PPM. Admitted by EP. Had ECHO and cMRI elevated PA pressures. Referred to HF Team for further evaluation. Had RHC with elevated PA pressures and volume overload with PVR 17. Placed on milrinone  to support RV + sildenafil . Underwent massive diuresis. Prior to d/c had repeat RHC with PVR down to 3.8 and optimized for HF. Plans to possibly add macitentan as an outpatient. Milrinone  was gradually weaned off.   S/p St. Jude dual chamber CRT-D.    Echo 03/2021: EF 25-30% mild RV HK Mild septal flattening TR not significant to estimate RVSP    R/L Cath 08/2021: nl coronaries EF 25%. Severe narrowing of R brachial artery prohibiting R radial approach. Dilated thoracic aorta with inability to access ascending aorta from left radial approach.   Last seen 01/2021.   Seen in AF clinic 07/2023, found to be in AF. Started on Eliquis . Now s/p DCCV 09/11/23. On device check 09/23/23 noted to have 45% AF burden.    Admitted to [facility] 09/22/23 for stroke. Per notes, he had not been on Eliquis  at this time. CTA head and neck + for left M1 occlusion with good collaterals. CT perfusion  attempted but was subsequently taken for thrombectomy. He then developed hemorrhagic conversion. Hospital course was complicated by right upper extremity PICC associated DVT, persistent hypotension requiring vasopressor support, aspiration pneumonia, EPEC diarrhea. He was weaned from pressors and diuresed. He underwent RHC and was started on sildenafil  and Digoxin . Reccommended discharge to inpatient rehab, however cost was an issue. He elected to be discharged home with PT/OT.   RHC on ~6 NE (10/2023): RA 15, PA 82/40 (52), PCWP 27, RVSP 75, CO/CI (thermo): 4.3/2.1   Returned to [facility] for HF follow up with interpreter and family member. Overall was feeling good. Had been able to perform ADLs and ambulate without SOB. Denied CP, dizziness, edema, or PND/Orthopnea. Had previously had swelling in L foot although now resolved. Residual deficits in R arm from stroke. Appetite was ok. No fever or chills. Reported taking all medications.    Today he returns to HF clinic for pharmacist medication titration. At last visit with APP, torsemide  was increased to 60 mg daily. Additionally, Farxiga  was discontinued due to severely uncontrolled BG. Overall he is feeling well today. No dizziness, lightheadedness, CP or palpitations. No SOB/DOE. Only complaint is that since his hospitalization for a stroke in 09/2023, his feet feel numb and heavy and it is more difficult to walk. His weight at home has been stable at 178 lbs. He takes torsemide  40 mg every AM and 20 mg every PM and has not needed any extra. No LEE, PND or orthopnea. His BG has been better controlled lately, POCT A1C  12/01/23 was 8.1% (down from 19.9 in October 2024).     HF Medications: Digoxin  0.125 mg daily Torsemide  40 mg q AM/20 mg qPM  Has the patient been experiencing any side effects to the medications prescribed?  no  Does the patient have any problems obtaining medications due to transportation or finances?   No; [employer] of regimen: good Understanding of indications: good Potential of compliance: good Patient understands to avoid NSAIDs. Patient understands to avoid decongestants.    Pertinent Lab Values: Labs from Care Everywhere 11/21/23: Serum creatinine 0.70, BUN 21, Potassium 3.8, Sodium 139  Vital Signs: Weight: 178.6 lbs (last clinic weight: 177.8 lbs) Blood pressure: 98/68  Heart rate: 70   Assessment/Plan: 1. Chronic biventricular systolic HF - Echo 03/2021: EF 74-69% mild RV HK Mild septal flattening TR not significant to estimate RVSP - Etiology remains unclear. There is no evidence of residual shunt or infiltrative process on cMRI/MRA or RHC. No scar - s/p STJ CRT-D in setting of CHB - Echo 08/2023: EF 25%. RV nl. Sev dilated LV and LA. Triv Gabriel w thickening, Mod AI w calcification. - RHC from [facility] (10/2023) on ~6 NE : RA 15, PA 82/40 (52), PCWP 27, RVSP 75, CO/CI (thermo): 4.3/2.1 - NYHA I. Euvolemic on exam.  - Continue torsemide  40 mg q AM/20 mg qPM + 20 mEq KCL daily. - Continue Digoxin  0.125 mg daily - Off Spiro, Losartan . No BP room to re-initiate GDMT. - Was taken off Farxiga  with severely uncontrolled BG. BG control has improved, from 19.9 09/2023 to 8.1 12/01/2023. Can consider restarting in the future. We discussed today and patient will discuss with his AHF/PH MD at [facility] (visit scheduled for 12/08/23).    2. Pulmonary HTN - Possible WHO Group I (congenital heart disease) +/-  II - Initial RHC 04/18/20 c/w very severe mixed pulmonary HTN, severely reduced CO in setting of cor pulmonale and no evidence of intracardiac shunting - RHC (03/2020) on milrinone  + sildenafil  with much improved hemodynamics. - RHC (08/2021) with marked improvement . V/Q scan 03/2021 negative for PE  - RHC from [facility] (10/2023) on ~6 NE : RA 15, PA 82/40 (52) - Autoimmune serologies negative  - Continue Sildenafil  40 mg TID (decreased from 60 mg TID during 09/2023  hospitalization). Soft BP, asymptomatic. Management per [facility]. I updated his medication list.    3. CVA  - CTA head and neck + for left M1 occlusion with good collaterals. Attempted thrombectomy, but had hemmorragic conversion - Per notes, had been off Eliquis  since DCCV - R side arm residual weakness - Continue Eliquis  5 mg BID - Continue ASA and statin   4. Chronic AF - Newly diagnosed 07/2023 - CHA2DS2-VASc Score now 4 - s/p DCCV 09/2023. Followed by Dr. Fernande and AF clinic - Continue Eliquis  - Device interrogation 11/10/23 with 89% AF burden - ECG 11/10/23 VP 70 bpm   5. CHB - s/p CRT-D 03/2021, followed by Dr. Fernande    6. Congenital heart disease - h/o aortic coarctation s/p surgical repair in 1987 in [place] (age 71) with residual thoracic aortic dialtion on imaging 10/2017 - Previously arranged CT chest to assure stable size    7. Severely uncontrolled Diabetes Type 2 - Hgb A1C 19.9 (09/2023) - On Insulin    Follow up 3 weeks with APP Clinic   Tinnie Redman, PharmD, BCPS, BCCP, CPP Heart Failure Clinic Pharmacist [phone]

## 2023-12-03 ENCOUNTER — Encounter: Payer: Self-pay | Admitting: Cardiology

## 2023-12-03 ENCOUNTER — Ambulatory Visit (HOSPITAL_COMMUNITY)
Admission: RE | Admit: 2023-12-03 | Discharge: 2023-12-03 | Disposition: A | Discharge status: Home / Self Care | Payer: Commercial Managed Care - HMO | Source: Ambulatory Visit | Attending: Cardiology | Admitting: Cardiology

## 2023-12-03 ENCOUNTER — Other Ambulatory Visit (HOSPITAL_COMMUNITY): Payer: Self-pay

## 2023-12-03 VITALS — BP 98/68 | HR 70 | Wt 178.6 lb

## 2023-12-03 DIAGNOSIS — Z8673 Personal history of transient ischemic attack (TIA), and cerebral infarction without residual deficits: Secondary | ICD-10-CM | POA: Insufficient documentation

## 2023-12-03 DIAGNOSIS — Z794 Long term (current) use of insulin: Secondary | ICD-10-CM | POA: Diagnosis not present

## 2023-12-03 DIAGNOSIS — I5082 Biventricular heart failure: Secondary | ICD-10-CM | POA: Insufficient documentation

## 2023-12-03 DIAGNOSIS — I482 Chronic atrial fibrillation, unspecified: Secondary | ICD-10-CM | POA: Diagnosis not present

## 2023-12-03 DIAGNOSIS — Z7901 Long term (current) use of anticoagulants: Secondary | ICD-10-CM | POA: Diagnosis not present

## 2023-12-03 DIAGNOSIS — I442 Atrioventricular block, complete: Secondary | ICD-10-CM | POA: Insufficient documentation

## 2023-12-03 DIAGNOSIS — Z79899 Other long term (current) drug therapy: Secondary | ICD-10-CM | POA: Insufficient documentation

## 2023-12-03 DIAGNOSIS — I5032 Chronic diastolic (congestive) heart failure: Secondary | ICD-10-CM | POA: Diagnosis not present

## 2023-12-03 DIAGNOSIS — I272 Pulmonary hypertension, unspecified: Secondary | ICD-10-CM | POA: Insufficient documentation

## 2023-12-03 DIAGNOSIS — I5022 Chronic systolic (congestive) heart failure: Secondary | ICD-10-CM | POA: Diagnosis not present

## 2023-12-03 DIAGNOSIS — Z7982 Long term (current) use of aspirin: Secondary | ICD-10-CM | POA: Diagnosis not present

## 2023-12-03 DIAGNOSIS — E119 Type 2 diabetes mellitus without complications: Secondary | ICD-10-CM | POA: Diagnosis not present

## 2023-12-03 DIAGNOSIS — Q249 Congenital malformation of heart, unspecified: Secondary | ICD-10-CM | POA: Diagnosis not present

## 2023-12-03 DIAGNOSIS — Z8774 Personal history of (corrected) congenital malformations of heart and circulatory system: Secondary | ICD-10-CM | POA: Insufficient documentation

## 2023-12-03 DIAGNOSIS — Z95810 Presence of automatic (implantable) cardiac defibrillator: Secondary | ICD-10-CM | POA: Diagnosis not present

## 2023-12-03 MED ORDER — SILDENAFIL CITRATE 20 MG PO TABS: 40.0000 mg | ORAL_TABLET | Freq: Three times a day (TID) | ORAL | Status: DC

## 2023-12-03 NOTE — Patient Instructions (Addendum)
  Fue un placer verte hoy!  MEDICAMENTOS: -No hay cambios de medicacin hoy -Llame si tiene preguntas sobre sus medicamentos.   PRXIMA CITA: Regreso a clnica en 3 semanas con APP Clnica.   Llame a la clnica al [phone] si tiene preguntas o para reprogramar citas futuras.    It was a pleasure seeing you today!  MEDICATIONS: -No medication changes today -Call if you have questions about your medications.   NEXT APPOINTMENT: Return to clinic in 3 weeks with APP Clinic.   Call the clinic at [phone] with questions or to reschedule future appointments.

## 2023-12-04 ENCOUNTER — Other Ambulatory Visit (HOSPITAL_COMMUNITY): Payer: Self-pay

## 2023-12-08 NOTE — Progress Notes (Signed)
 HEART & VASCULAR [facility] [address]  [address] [place], [place] [address]    PULMONARY HYPERTENSION VISIT     PATIENT:  Gabriel Floyd DOB: 06/13/1981              MRN:  [id number]                                                  PRIMARY CARDIOLOGIST: Dr Cherrie  PH Clinic: [facility], [facility] PCP:  No primary care provider on file.    DATE OF VISIT:  12/08/2023                                          CC: Pulmonary hypertension care     HISTORY OF PRESENT ILLNESS:  Gabriel Floyd is a pleasant 43 y.o. male medical history significant for below:  Embolic L MCA stroke s/p thrombectomy  Hemorrhagic transformation Chronic BiV Failure  CHB s/p St Jude CRT-D Who Group 1&2 PAH - Group 1 from congenital heart disease- s/p VSD repair 1987  - Group 2 from Left Heart Disease Aortic Coarctation + VSD  2/2 Repair in 1987 + Sub aortic membrane repair 2018 Paroxysmal atrial fibrillation - s/p DCCV 09/11/2023 Diabetes mellitus, type 2, uncontrolled  Hypovolemic hypernatremia   Enteropathogenic Ecoli   The patient was admitted for 35 day LOS 09/22/23. - Witnessed seizure activity with work up showing Embolic L MCA stroke s/p thrombectomy with Hemorrhagic transformation. - Required pressors treatment and AHF team assisted with management. Diuresed. RHC obtained showing high filling pressures and PVR of 6. Levophed weaned. Restarted on sildenafil . Digoxin  added for RV support.  - DM was poorly controlled with A1c of 19.9. Started on insulin .   He is here today with his father. Visit completed through [facility] interpreter, Marty.  Patient reports he is doing well and is unchanged from last visit.  He does endorse some pedal edema.  No increased dyspnea, orthopnea or PND.  Blood sugars are much improved on current regimen.  Following with his PCP for this.  Patient recently got a blood pressure cuff and denies any presyncope or syncope.   No chest pain.  Reports compliance with his medications.   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 PVR 3.0 WU. SVR 1036 - 09/2023: mean PAP~ 50 mmHg. TPG 25. PVR 6 WU. CI~2.1 on NE. SVR~1400. High filling pressures   6-MWT DATA:   LABS:  Lab Results  Component Value Date   Dig 0.6 (L) 10/22/2023    Lab Results  Component Value Date   Creatinine 0.75 (L) 12/01/2023   BUN 16 12/01/2023   Sodium 139 12/01/2023   Potassium 4.0 12/01/2023   Chloride 98 12/01/2023   CO2 26 12/01/2023   Lab Results  Component Value Date   CHOLESTEROL TOTAL 172 09/22/2023   Lab Results  Component Value  Date   HDL 29 (L) 09/22/2023   Lab Results  Component Value Date   LDL 117 (H) 09/22/2023   Lab Results  Component Value Date   Trig 128 09/22/2023   Lab Results  Component Value Date   TSH 2.70 09/22/2023      PAST MEDICAL HISTORY: Past Medical History:  Diagnosis Date  . Biventricular ICD (implantable cardioverter-defibrillator) in place   . Cardiomegaly   . Congenital heart defect   . Diabetes mellitus (*)   . Stroke (*)      PAST SURGICAL HISTORY: No past surgical history on file.   ALLERGIES: No Known Allergies   SOCIAL HISTORY:   Social History   Tobacco Use  . Smoking status: Never    Passive exposure: Never  . Smokeless tobacco: Never  Vaping Use  . Vaping status: Never Used  Substance Use Topics  . Alcohol use: Never  . Drug use: Never     FAMILY HISTORY: No family history on file.   CURRENT MEDICATIONS: Current Outpatient Medications  Medication Instructions  . apixaban  (ELIQUIS ) 5 mg, Oral, 2 times a day  . aspirin   81 mg, Oral, Daily  . atorvastatin  (LIPITOR) 40 mg, Oral, At bedtime  . BASAGLAR KWIKPEN 12 Units, Subcutaneous, At bedtime  . Blood Glucose Monitoring Suppl (BLOOD GLUCOSE MONITOR SYSTEM) w/Device KIT 1 kit, Does not apply, With meals & at bedtime  . Blood Pressure KIT 1 Device, Does not apply, Daily  . digoxin  (LANOXIN ) 125 mcg, Oral, Daily  . glucose blood test strip Use as directed. Pharmacy, please dispense patient choice of test strips.  . Insulin  Pen Needle 30G X 5 MM MISC 1 each, Does not apply, Daily  . magnesium  oxide (MAG-OX) 400 mg, Oral, Daily  . metformin  (GLUCOPHAGE ) 1000 MG tablet Take 1 tablet/day for first week.  Second week onward transition to taking 2 tablets daily.  . sildenafil  citrate (REVATIO ) 40 mg, Oral, 3 times a day  . spironolactone  (ALDACTONE ) 25 mg, Oral, Daily  . torsemide  (DEMADEX ) 40 mg, Oral, Every morning     PHYSICAL EXAMINATION:   BP 104/72 (BP Location: Left Upper Arm, Patient Position: Sitting)   Pulse 71   Ht 5' 10 (1.778 m)   Wt 172 lb 3.2 oz (78.1 kg)   SpO2 99%   BMI 24.71 kg/m  General: No apparent distress. Communicating appropriately but with intermittent slurred speech   Psych: Normal mood and affect.  HENT: Normocephalic. No acute abnormalities appreciated.  Cardiovascular:  Normal S1. Normal S2. No S3. No S4. No murmurs. Regular rhythm. Tr ankle edema. JVP:no V wave  Pulmonary/Chest: CTA bilaterally. No rales. No wheezing.  Abdomen: Soft, nontender, no masses, no hepatomegaly. Neuro: No gross motor or sensory deficit. Skin: Warm.   Vascular: Normal distal pulses bilaterally.   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 Will not add ERA given Left Heart disease. -  Continue digoxin  for RV support. Add spironolactone  for BiV heart failure. - Cautious with  BB/CCB due to recent decompensation.  - No BP room for ACEi/ARB/ARNI at this time.  - Consider SGLT2i given improving A1C - Not a candidate for Advanced therapies given RV failure, pre and post capillary PH    Nat Adine [facility], PA-C

## 2023-12-14 NOTE — Progress Notes (Signed)
Advanced Heart Failure Clinic Note   PCP: Mallory Shirk Rosalita Levan) HF Cardiologist: Dr. Gala Romney  EP: Dr. Graciela Husbands  Attending: Dr. Gala Romney  Chief complaint: Follow up of his chronic biV HF  HPI: Gabriel Floyd is a 43 y.o. 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 Admitted by EP. Had ECHO and cMRI elevated PA pressures. Referred to HF Team for further evaluation. Had RHC with elevated PA pressures and volume overload with PVR 17. Placed on milrinone to support RV + sildenafil. Underwent massive diuresis. Prior to d/c had repeat RHC with PVR down to 3.8 and optimized for HF. Plans to possibly add macitentan as an outpatient. Milrinone was gradaully weaned off.  S/p St. Jude dual chamber CRT-D.  Echo 5/22: EF 25-30% mild RV HK Mild septal flattening TR not significant to estimate RVSP   R/L Cath 10/22: nl coronaries EF 25%. Severe narrowing of R brachial artery prohibiting R radial approach. Dilated thoracic aorta with inability to access ascending aorta from left radial approach.  Seen in AF clinic 8/24, last seen 2/22, found to be in AF. Started on Eliquis. Now s/p DCCV 09/11/23. On device check 09/23/23 noted to have 45% AF burden.   Admitted to [facility] 09/22/23 for stroke. Per notes, he had not been on Eliquis at this time. CTA head and neck + for left M1 occlusion with good  collaterals. CT perfusion attempted but was subsequently taken for thrombectomy. He then developed hemorrhagic conversion. Hospital course was complicated by right upper extremity PICC associated DVT, persistent hypotension requiring vasopressor support, aspiration pneumonia, EPEC diarrhea. He was weaned from pressors and diuresed. He underwent RHC and was started on sildenafil and Digoxin. Recommended discharge to inpatient rehab, however cost was an issue. He elected to be discharged home with PT/OT.  RHC on ~6 NE (11/24): RA 15, PA 82/40 (52), PCWP 27, RVSP 75, CO/CI (thermo): 4.3/2.1  Today he returns for AHF follow up. Overall feeling ***. Denies palpitations, CP, dizziness, edema, or PND/Orthopnea. *** SOB. Appetite ok. No fever or chills. Weight at home *** pounds. Taking all medications. Denies ETOH, tobacco or drug use.   Cardiac studies: - RHC from [facility] (11/24) on ~6 NE : RA 15, PA 82/40 (52), PCWP 27, RVSP 75, CO/CI (thermo): 4.3/2.1 - Echo (9/24): EF 25%. Sev dilated LV and LA. Triv Gabriel w thickening, Mod AI w calcification. - R/LHC (10/22): normal cors, EF 25%, mild pul htn, normal CO with no evidence of shunting. - Echo (5/22): EF 25-30%, mild RV, HK Mild septal flattening TR not significant to estimate RVSP - RHC (5/21) on Milrinone 0.125 mcg/kg/min + Sildenafil 40 tid : RA 3, PA 68/23 (41), PCWP 22, Fick CO/CI 5.1/2.3, PVR 3.8 W - RHC (5/21): RA 15 v-waves, PA 132/52 (77), PCWP 31, Fick CO/CI 2.7/1.35, PVR 17.3 W - Echo (9/21): EF 30-35% Grade 3DD  RV ok Very mild AS  Review of systems complete and found  to be negative unless listed in HPI.    Past Medical History:  Diagnosis Date   CHF (congestive heart failure) (HCC)    Heart failure (HCC)    Pulmonary hyperinflation    Current Outpatient Medications  Medication Sig Dispense Refill   apixaban (ELIQUIS) 5 MG TABS tablet Take 1 tablet (5 mg total) by mouth 2 (two) times daily. 60 tablet 6   aspirin 81 MG chewable tablet Chew  81 mg by mouth daily.     atorvastatin (LIPITOR) 40 MG tablet Take 1 tablet (40 mg total) by mouth at bedtime. 90 tablet 3   digoxin (LANOXIN) 0.125 MG tablet Take 0.125 mg by mouth daily.     Insulin Glargine (LANTUS [place]) Inject 10 Units into the skin at bedtime.     magnesium oxide (MAG-OX) 400 (240 Mg) MG tablet Take 400 mg by mouth daily.     potassium chloride SA (KLOR-CON M20) 20 MEQ tablet Take 1 tablet (20 mEq total) by mouth daily. 90 tablet 3   sildenafil (REVATIO) 20 MG tablet Take 2 tablets (40 mg total) by mouth 3 (three) times daily.     torsemide (DEMADEX) 20 MG tablet Take 3 tablets (60 mg total) by mouth daily. 180 tablet 3   No current facility-administered medications for this visit.   No Known Allergies  Social History   Socioeconomic History   Marital status: Single    Spouse name: Not on file   Number of children: Not on file   Years of education: Not on file   Highest education level: Not on file  Occupational History   Not on file  Tobacco Use   Smoking status: Never   Smokeless tobacco: Never  Vaping Use   Vaping status: Never Used  Substance and Sexual Activity   Alcohol use: No   Drug use: No   Sexual activity: Not on file  Other Topics Concern   Not on file  Social History Narrative   Not on file   Social Drivers of Health   Financial Resource Strain: Low Risk  (12/08/2023)   [facility]   Overall Financial Resource Strain (CARDIA)    Difficulty of Paying Living Expenses: Not very hard  Recent Concern: Financial Resource Strain - High Risk (10/05/2023)   Received from [facility]   Overall Financial Resource Strain (CARDIA)    Difficulty of Paying Living Expenses: Hard  Food Insecurity: No Food Insecurity (12/08/2023)   Received from [facility]   Hunger Vital Sign    Worried About Running Out of Food in the Last Year: Never true    Ran Out of Food in the Last Year: Never true  Transportation Needs: No Transportation Needs  (12/08/2023)   [facility] - Transportation    Lack of Transportation (Medical): No    Lack of Transportation (Non-Medical): No  Physical Activity: Not on File (10/30/2022)   Received from [facility]   Physical Activity    Physical Activity: 0  Stress: No Stress Concern Present (10/05/2023)   Received from [facility] - Occupational Stress Questionnaire    Feeling of Stress : Not at all  Social Connections: Unknown (09/22/2023)   [facility]   Social Network    Social Network: Not on file  Intimate Partner Violence: Not At Risk (11/21/2023)   Received from [facility]   HITS    Over the last 12 months how  often did your partner physically hurt you?: Never    Over the last 12 months how often did your partner insult you or talk down to you?: Never    Over the last 12 months how often did your partner threaten you with physical harm?: Never    Over the last 12 months how often did your partner scream or curse at you?: Never   Family History  Problem Relation Age of Onset   Healthy Mother    Healthy Father    There were no vitals taken for this visit.  Wt Readings from Last 3 Encounters:  12/03/23 81 kg (178 lb 9.6 oz)  11/10/23 80.6 kg (177 lb 12.8 oz)  09/11/23 81.6 kg (180 lb)   PHYSICAL EXAM: General:  *** appearing.  No respiratory difficulty HEENT: normal Neck: supple. JVD *** cm. Carotids 2+ bilat; no bruits. No lymphadenopathy or thyromegaly appreciated. Cor: PMI nondisplaced. Regular rate & rhythm. No rubs, gallops or murmurs. Lungs: clear Abdomen: soft, nontender, nondistended. No hepatosplenomegaly. No bruits or masses. Good bowel sounds. Extremities: no cyanosis, clubbing, rash, edema  Neuro: alert & oriented x 3, cranial nerves grossly intact. moves all 4 extremities w/o difficulty. Affect pleasant.   Device interrogation (personally reviewed): CorVue stable, 95% BiV pacing, 4.2%  A pacing, no VT, 89% AF ***  ECG (personally reviewed from 12/24): VP at 70 bpm  ASSESSMENT & PLAN: 1. Chronic biventricular systolic HF - Echo 5/22: EF 25-30% mild RV HK Mild septal flattening TR not significant to estimate RVSP - Etiology remains unclear. There is no evidence of residual shunt or infiltrative process on cMRI/MRA or RHC. No scar - s/p STJ CRT-D in setting of CHB - Echo 9/24: EF 25%. RV nl. Sev dilated LV and LA. Triv Gabriel w thickening, Mod AI w calcification. - RHC from [facility] (11/24) on ~6 NE : RA 15, PA 82/40 (52), PCWP 27, RVSP 75, CO/CI (thermo): 4.3/2.1 - NYHA I. Volume up on exam. *** - Off Spiro, Losartan. No BP room to re-initiate GDMT. - Continue Digoxin. Check dig level today. *** - Continue torsemide 40 mg q AM/20 mg qPM + 20 mEq KCL daily.  - Was taken off Farxiga with severely uncontrolled BG. BG control has improved, from 19.9 09/2023 to 8.1 12/01/2023. Can consider restarting in the future. We discussed today and patient will discuss with his AHF/PH MD at [facility] (visit scheduled for 12/08/23).   2. Pulmonary HTN - Possible WHO Group I (congenital heart disease) +/-  II - Initial RHC 04/18/20 c/w very severe mixed pulmonary HTN, severely reduced CO in setting of cor pulmonale and no evidence of intracardiac shunting - RHC (5/21) on milrinone + sildenafil with much improved hemodynamics. - RHC (10/22) with marked improvement . V/Q scan 5/22 negative for PE  - RHC from [facility] (11/24) on ~6 NE : RA 15, PA 82/40 (52) - Autoimmune serologies negative  - On Sildenafil 60 tid. Soft BP, asymptomatic. Management per [facility].  3. CVA  - CTA head and neck + for left M1 occlusion with good collaterals. Attempted thrombectomy, but had hemmorragic conversion - Per notes, had been off Eliquis since DCCV - R side arm residual weakness - Continue Eliquis 5mg  bid - Continue ASA and statin  4. Chronic AF - Newly diagnosed 07/2023 - CHA2DS2-VASc Score now  4 - s/p DCCV 10/24. Followed by Dr. Graciela Husbands and AF clinic - Continue Eliquis - Device interrogation today with 89% AF burden - ECG today  VP 70 bpm  5. CHB - s/p CRT-D 5/22, followed by Dr. Graciela Husbands   6. Congenital heart disease - h/o aortic coarctation s/p surgical repair in 1987 in [place] (age 89) with residual thoracic aortic dialtion on imaging 12/18 - Arrange CT chest to assure stable size   7. Severely uncontrolled Diabetes Type 2 - Hgb A1C 19.9 (10/24) - Stop Farxiga  - On Insulin   Follow up in 3 months with Dr. Glenice Laine, NP 12/21/23

## 2023-12-21 ENCOUNTER — Telehealth (HOSPITAL_COMMUNITY): Payer: Self-pay

## 2023-12-21 NOTE — Telephone Encounter (Signed)
Called to confirm/remind patient of their appointment at the Advanced Heart Failure Clinic on 12/22/23.   Patient reminded to bring all medications and/or complete list.  Confirmed patient has transportation. Gave directions, instructed to utilize valet parking.  Confirmed appointment prior to ending call.

## 2023-12-22 ENCOUNTER — Ambulatory Visit (HOSPITAL_COMMUNITY)
Admission: RE | Admit: 2023-12-22 | Discharge: 2023-12-22 | Disposition: A | Discharge status: Home / Self Care | Payer: Commercial Managed Care - HMO | Source: Ambulatory Visit | Attending: Internal Medicine | Admitting: Internal Medicine

## 2023-12-22 ENCOUNTER — Encounter (HOSPITAL_COMMUNITY): Payer: Self-pay

## 2023-12-22 VITALS — BP 94/62 | HR 70 | Wt 183.6 lb

## 2023-12-22 DIAGNOSIS — E1165 Type 2 diabetes mellitus with hyperglycemia: Secondary | ICD-10-CM

## 2023-12-22 DIAGNOSIS — I442 Atrioventricular block, complete: Secondary | ICD-10-CM

## 2023-12-22 DIAGNOSIS — I63512 Cerebral infarction due to unspecified occlusion or stenosis of left middle cerebral artery: Secondary | ICD-10-CM | POA: Diagnosis not present

## 2023-12-22 DIAGNOSIS — I5032 Chronic diastolic (congestive) heart failure: Secondary | ICD-10-CM | POA: Diagnosis not present

## 2023-12-22 DIAGNOSIS — Z8774 Personal history of (corrected) congenital malformations of heart and circulatory system: Secondary | ICD-10-CM | POA: Diagnosis not present

## 2023-12-22 DIAGNOSIS — I5082 Biventricular heart failure: Secondary | ICD-10-CM | POA: Insufficient documentation

## 2023-12-22 DIAGNOSIS — Z794 Long term (current) use of insulin: Secondary | ICD-10-CM | POA: Diagnosis not present

## 2023-12-22 DIAGNOSIS — Z7901 Long term (current) use of anticoagulants: Secondary | ICD-10-CM | POA: Insufficient documentation

## 2023-12-22 DIAGNOSIS — I272 Pulmonary hypertension, unspecified: Secondary | ICD-10-CM

## 2023-12-22 DIAGNOSIS — Q249 Congenital malformation of heart, unspecified: Secondary | ICD-10-CM

## 2023-12-22 DIAGNOSIS — I69231 Monoplegia of upper limb following other nontraumatic intracranial hemorrhage affecting right dominant side: Secondary | ICD-10-CM | POA: Diagnosis not present

## 2023-12-22 DIAGNOSIS — I5022 Chronic systolic (congestive) heart failure: Secondary | ICD-10-CM | POA: Diagnosis not present

## 2023-12-22 DIAGNOSIS — I482 Chronic atrial fibrillation, unspecified: Secondary | ICD-10-CM

## 2023-12-22 LAB — BRAIN NATRIURETIC PEPTIDE: B Natriuretic Peptide: 414.9 pg/mL — ABNORMAL HIGH (ref 0.0–100.0)

## 2023-12-22 LAB — BASIC METABOLIC PANEL
Anion gap: 9 (ref 5–15)
BUN: 25 mg/dL — ABNORMAL HIGH (ref 6–20)
CO2: 29 mmol/L (ref 22–32)
Calcium: 9.7 mg/dL (ref 8.9–10.3)
Chloride: 102 mmol/L (ref 98–111)
Creatinine, Ser: 0.91 mg/dL (ref 0.61–1.24)
GFR, Estimated: >60 mL/min (ref >60)
Glucose, Bld: 120 mg/dL — ABNORMAL HIGH (ref 70–99)
Potassium: 4 mmol/L (ref 3.5–5.1)
Sodium: 140 mmol/L (ref 135–145)

## 2023-12-22 MED ORDER — TORSEMIDE 20 MG PO TABS: 40.0000 mg | ORAL_TABLET | Freq: Every day | ORAL | 3 refills | Status: AC

## 2023-12-22 MED ORDER — SILDENAFIL CITRATE 20 MG PO TABS: 40.0000 mg | ORAL_TABLET | Freq: Three times a day (TID) | ORAL | 6 refills | Status: AC

## 2023-12-22 MED ORDER — DAPAGLIFLOZIN PROPANEDIOL 10 MG PO TABS: 10.0000 mg | ORAL_TABLET | Freq: Every day | ORAL | 6 refills | Status: AC

## 2023-12-22 MED ORDER — SILDENAFIL CITRATE 20 MG PO TABS: 20.0000 mg | ORAL_TABLET | Freq: Three times a day (TID) | ORAL | 0 refills | Status: AC

## 2023-12-22 MED ORDER — DIGOXIN 125 MCG PO TABS: 0.0625 mg | ORAL_TABLET | Freq: Every day | ORAL | 3 refills | Status: AC

## 2023-12-22 NOTE — Patient Instructions (Addendum)
Thank you for coming in today  If you had labs drawn today, any labs that are abnormal the clinic will call you No news is good news  You have been given a prescription for compression hose. And a list of places who supply them. Please wear your compression hose daily, place them on as soon as you get up in the morning and remove before you go to bed at night.   Medications: Start Farxiga 10 mg 1 tablet daily Decrease Digoxin to 0.0625 mg 1/2 tablet daily Decrease Torsemide to 40 mg 2 tablets daily Restart Sidenifil 20 mg three times for daily for 1 week then 40 mg 3 times daily   Follow up appointments: Your physician recommends that you return for lab work in: 1 week for BMET  Your physician recommends that you schedule a follow-up appointment in:  3 months With Dr. Earlean Shawl will receive a reminder letter in the mail a few months in advance. If you don't receive a letter, please call our office to schedule the follow-up appointment.    Do the following things EVERYDAY: Weigh yourself in the morning before breakfast. Write it down and keep it in a log. Take your medicines as prescribed Eat low salt foods--Limit salt (sodium) to 2000 mg per day.  Stay as active as you can everyday Limit all fluids for the day to less than 2 liters   At the Advanced Heart Failure Clinic, you and your health needs are our priority. As part of our continuing mission to provide you with exceptional heart care, we have created designated Provider Care Teams. These Care Teams include your primary Cardiologist (physician) and Advanced Practice Providers (APPs- Physician Assistants and Nurse Practitioners) who all work together to provide you with the care you need, when you need it.   You may see any of the following providers on your designated Care Team at your next follow up: Dr Arvilla Meres Dr Marca Ancona Dr. Marcos Eke, NP Robbie Lis, [place] [facility] [place], [place] Brynda Peon, NP Karle Plumber, PharmD   Please be sure to bring in all your medications bottles to every appointment.    Thank you for choosing [facility]  If you have any questions or concerns before your next appointment please send [place] a message through Staves or call our office at [phone].    TO LEAVE A MESSAGE FOR THE NURSE SELECT OPTION 2, PLEASE LEAVE A MESSAGE INCLUDING: YOUR NAME DATE OF BIRTH CALL BACK NUMBER REASON FOR CALL**this is important as we prioritize the call backs  YOU WILL RECEIVE A CALL BACK THE SAME DAY AS LONG AS YOU CALL BEFORE 4:00 PM

## 2023-12-24 ENCOUNTER — Ambulatory Visit (INDEPENDENT_AMBULATORY_CARE_PROVIDER_SITE_OTHER): Payer: Self-pay

## 2023-12-24 DIAGNOSIS — I63512 Cerebral infarction due to unspecified occlusion or stenosis of left middle cerebral artery: Secondary | ICD-10-CM | POA: Diagnosis not present

## 2023-12-24 LAB — CUP PACEART REMOTE DEVICE CHECK
Battery Remaining Longevity: 26 mo
Battery Remaining Percentage: 44 %
Battery Voltage: 2.9 V
Brady Statistic AP VP Percent: 38 %
Brady Statistic AP VS Percent: 1 %
Brady Statistic AS VP Percent: 61 %
Brady Statistic AS VS Percent: 1 %
Brady Statistic RA Percent Paced: 2.5 %
Date Time Interrogation Session: 20250123010235
HighPow Impedance: 64 Ohm
Implantable Lead Connection Status: 753985
Implantable Lead Connection Status: 753985
Implantable Lead Connection Status: 753985
Implantable Lead Implant Date: 20210526
Implantable Lead Implant Date: 20210526
Implantable Lead Implant Date: 20210526
Implantable Lead Location: 753858
Implantable Lead Location: 753859
Implantable Lead Location: 753860
Implantable Pulse Generator Implant Date: 20210526
Lead Channel Impedance Value: 380 Ohm
Lead Channel Impedance Value: 450 Ohm
Lead Channel Impedance Value: 480 Ohm
Lead Channel Pacing Threshold Amplitude: 0.625 V
Lead Channel Pacing Threshold Amplitude: 0.75 V
Lead Channel Pacing Threshold Amplitude: 2.25 V
Lead Channel Pacing Threshold Pulse Width: 0.5 ms
Lead Channel Pacing Threshold Pulse Width: 0.5 ms
Lead Channel Pacing Threshold Pulse Width: 1 ms
Lead Channel Sensing Intrinsic Amplitude: 12 mV
Lead Channel Sensing Intrinsic Amplitude: 5 mV
Lead Channel Setting Pacing Amplitude: 1.625
Lead Channel Setting Pacing Amplitude: 2.5 V
Lead Channel Setting Pacing Amplitude: 3.25 V
Lead Channel Setting Pacing Pulse Width: 0.5 ms
Lead Channel Setting Pacing Pulse Width: 1 ms
Lead Channel Setting Sensing Sensitivity: 0.5 mV
Pulse Gen Serial Number: 111023761
Zone Setting Status: 755011

## 2023-12-29 ENCOUNTER — Ambulatory Visit (HOSPITAL_COMMUNITY)
Admission: RE | Admit: 2023-12-29 | Discharge: 2023-12-29 | Disposition: A | Discharge status: Home / Self Care | Payer: Commercial Managed Care - HMO | Source: Ambulatory Visit | Attending: Family Medicine | Admitting: Family Medicine

## 2023-12-29 DIAGNOSIS — I5022 Chronic systolic (congestive) heart failure: Secondary | ICD-10-CM | POA: Diagnosis present

## 2023-12-29 LAB — BASIC METABOLIC PANEL
Anion gap: 8 (ref 5–15)
BUN: 29 mg/dL — ABNORMAL HIGH (ref 6–20)
CO2: 30 mmol/L (ref 22–32)
Calcium: 9.6 mg/dL (ref 8.9–10.3)
Chloride: 101 mmol/L (ref 98–111)
Creatinine, Ser: 1.05 mg/dL (ref 0.61–1.24)
GFR, Estimated: >60 mL/min (ref >60)
Glucose, Bld: 144 mg/dL — ABNORMAL HIGH (ref 70–99)
Potassium: 3.8 mmol/L (ref 3.5–5.1)
Sodium: 139 mmol/L (ref 135–145)

## 2024-02-03 NOTE — Progress Notes (Signed)
 Remote ICD transmission.

## 2024-03-24 ENCOUNTER — Ambulatory Visit (INDEPENDENT_AMBULATORY_CARE_PROVIDER_SITE_OTHER): Payer: Self-pay

## 2024-03-24 DIAGNOSIS — I63512 Cerebral infarction due to unspecified occlusion or stenosis of left middle cerebral artery: Secondary | ICD-10-CM | POA: Diagnosis not present

## 2024-03-24 LAB — CUP PACEART REMOTE DEVICE CHECK
Battery Remaining Longevity: 24 mo
Battery Remaining Percentage: 40 %
Battery Voltage: 2.89 V
Brady Statistic AP VP Percent: 38 %
Brady Statistic AP VS Percent: 1 %
Brady Statistic AS VP Percent: 61 %
Brady Statistic AS VS Percent: 1 %
Brady Statistic RA Percent Paced: 1.3 %
Date Time Interrogation Session: 20250424030128
HighPow Impedance: 66 Ohm
Implantable Lead Connection Status: 753985
Implantable Lead Connection Status: 753985
Implantable Lead Connection Status: 753985
Implantable Lead Implant Date: 20210526
Implantable Lead Implant Date: 20210526
Implantable Lead Implant Date: 20210526
Implantable Lead Location: 753858
Implantable Lead Location: 753859
Implantable Lead Location: 753860
Implantable Pulse Generator Implant Date: 20210526
Lead Channel Impedance Value: 380 Ohm
Lead Channel Impedance Value: 440 Ohm
Lead Channel Impedance Value: 490 Ohm
Lead Channel Pacing Threshold Amplitude: 0.625 V
Lead Channel Pacing Threshold Amplitude: 0.75 V
Lead Channel Pacing Threshold Amplitude: 2.25 V
Lead Channel Pacing Threshold Pulse Width: 0.5 ms
Lead Channel Pacing Threshold Pulse Width: 0.5 ms
Lead Channel Pacing Threshold Pulse Width: 1 ms
Lead Channel Sensing Intrinsic Amplitude: 11.8 mV
Lead Channel Sensing Intrinsic Amplitude: 5 mV
Lead Channel Setting Pacing Amplitude: 1.625
Lead Channel Setting Pacing Amplitude: 2.5 V
Lead Channel Setting Pacing Amplitude: 3.25 V
Lead Channel Setting Pacing Pulse Width: 0.5 ms
Lead Channel Setting Pacing Pulse Width: 1 ms
Lead Channel Setting Sensing Sensitivity: 0.5 mV
Pulse Gen Serial Number: 111023761
Zone Setting Status: 755011

## 2024-05-03 NOTE — Progress Notes (Signed)
 Remote ICD transmission.

## 2024-06-23 ENCOUNTER — Ambulatory Visit (INDEPENDENT_AMBULATORY_CARE_PROVIDER_SITE_OTHER): Payer: Self-pay

## 2024-06-23 DIAGNOSIS — I63512 Cerebral infarction due to unspecified occlusion or stenosis of left middle cerebral artery: Secondary | ICD-10-CM | POA: Diagnosis not present

## 2024-06-24 LAB — CUP PACEART REMOTE DEVICE CHECK
Battery Remaining Longevity: 22 mo
Battery Remaining Percentage: 36 %
Battery Voltage: 2.89 V
Brady Statistic AP VP Percent: 38 %
Brady Statistic AP VS Percent: 1 %
Brady Statistic AS VP Percent: 61 %
Brady Statistic AS VS Percent: 1 %
Brady Statistic RA Percent Paced: 1 %
Date Time Interrogation Session: 20250724054700
HighPow Impedance: 77 Ohm
Implantable Lead Connection Status: 753985
Implantable Lead Connection Status: 753985
Implantable Lead Connection Status: 753985
Implantable Lead Implant Date: 20210526
Implantable Lead Implant Date: 20210526
Implantable Lead Implant Date: 20210526
Implantable Lead Location: 753858
Implantable Lead Location: 753859
Implantable Lead Location: 753860
Implantable Pulse Generator Implant Date: 20210526
Lead Channel Impedance Value: 450 Ohm
Lead Channel Impedance Value: 500 Ohm
Lead Channel Impedance Value: 510 Ohm
Lead Channel Pacing Threshold Amplitude: 0.625 V
Lead Channel Pacing Threshold Amplitude: 0.75 V
Lead Channel Pacing Threshold Amplitude: 2.25 V
Lead Channel Pacing Threshold Pulse Width: 0.5 ms
Lead Channel Pacing Threshold Pulse Width: 0.5 ms
Lead Channel Pacing Threshold Pulse Width: 1 ms
Lead Channel Sensing Intrinsic Amplitude: 11.8 mV
Lead Channel Sensing Intrinsic Amplitude: 5 mV
Lead Channel Setting Pacing Amplitude: 1.625
Lead Channel Setting Pacing Amplitude: 2.5 V
Lead Channel Setting Pacing Amplitude: 3.25 V
Lead Channel Setting Pacing Pulse Width: 0.5 ms
Lead Channel Setting Pacing Pulse Width: 1 ms
Lead Channel Setting Sensing Sensitivity: 0.5 mV
Pulse Gen Serial Number: 111023761
Zone Setting Status: 755011

## 2024-08-08 NOTE — Progress Notes (Signed)
 Subjective   Patient ID:  Gabriel Floyd is a 43 y.o. (DOB [birth year]) male    Patient presents with  . Diabetes     HPI: 43 year old male to clinic for follow-up diabetes.  Past med history of CVA, atrial fibrillation and pacemaker.  Also notes nasal congestion last several weeks feels it is 1 allergy based primarily worse when laying down previously used a nose spray which helped for this would like to start with this again.  Overall states he feels well today no acute concerns.  Compliant with home medications and diabetes diet notes on average home glucose ranges 110-126 on average in the morning.  Has not had any hypoglycemic events.  Compliant with metformin , Farxiga , Basaglar.  Follows with cardiology regularly compliant with furosemide  spironolactone , Eliquis , digoxin .  No chest pain shortness of breath or palpitations.  Of note patient has been taking ibuprofen intermittently, has been advised to discontinue this due to anticoagulant use.   Reviewed and updated this visit by provider: Tobacco  Allergies  Meds  Problems  Med Hx  Surg Hx  Fam Hx        Review of Systems  Constitutional:  Negative for chills, diaphoresis, fatigue and fever.  Respiratory:  Negative for cough, shortness of breath and wheezing.   Cardiovascular:  Negative for chest pain and palpitations.  Neurological:  Negative for light-headedness and headaches.     Objective   Vitals:   08/08/24 0759  BP: 110/72  Patient Position: Sitting  Pulse: 70  Temp: 97.3 F (36.3 C)  TempSrc: Tympanic  Resp: 16  Height: 5' 10 (1.778 m)  Weight: 184 lb 3.2 oz (83.6 kg)  SpO2: 97%  BMI (Calculated): 26.4  PainSc:   4  PainLoc: Foot     Physical Exam Constitutional:      Appearance: Normal appearance.  HENT:     Nose: Congestion present.     Comments: Right sided nasal turbinate boggy enlarged. Cardiovascular:     Rate and Rhythm: Normal rate and regular rhythm.     Heart sounds: Normal heart  sounds. No murmur heard. Pulmonary:     Effort: Pulmonary effort is normal.     Breath sounds: Normal breath sounds. No wheezing, rhonchi or rales.  Skin:    General: Skin is warm.     Coloration: Skin is not pale.     Findings: No erythema or rash.  Neurological:     General: No focal deficit present.     Mental Status: He is alert and oriented to person, place, and time.  Diabetic foot exam:  Left: Monofilament test: Sensation normal  Pulses: normal and present  Skin: Normal and no erythema, no cyanosis or pallor   Other findings: none Right: Monofilament test: Sensation normal  Pulses: normal and present  Skin: Normal and no erythema, no cyanosis or pallor   Other findings: none Exam performed with shoes and socks removed.      Assessment and Plan  1. Type 2 diabetes mellitus with hyperglycemia, with long-term current use of insulin  (*) (Primary) -Well improved now controlled.  A1c 6.8 continue current metformin , Farxiga , glargine.  Diabetic foot exam updated.  Continue with diabetes diet. -     POCT Hemoglobin A1C -     dapagliflozin  (FARXIGA ) 10 mg tablet 2. History of cardioembolic cerebrovascular accident (CVA) -History of CVA, compliant with aspirin  and atorvastatin .  LDL goal ideally less than 50 given diabetes with comorbidities however showed significant improvement from 1 32-65.  Is at mild cholesterol goal less than 70.  Continue current medications.  Continue to monitor 3. Longstanding persistent atrial fibrillation (*) -Normal rate and rhythm in clinic continue with Eliquis .  Is not on rate control medication continue with digoxin . 4. Pacemaker 5. Nasal congestion -Start with Flonase follow-up not improving -     fluticasone propionate (FLONASE) 50 mcg/actuation nasal spray     Gabriel KATHEE Rice II, PA-C 08/08/2024, 10:55 AM  This note was dictated with voice recognition software. Similar sounding words or phrases can inadvertently be transcribed and may not be  corrected upon review.   Follow up in about 6 months (around 02/05/2025) for physical and labs/DM-.      Patient's Medications  New Prescriptions   FLUTICASONE PROPIONATE (FLONASE) 50 MCG/ACTUATION NASAL SPRAY    one spray by Nasal route daily.  Previous Medications   APIXABAN  (ELIQUIS ) 5 MG TABLET    Take one tablet (5 mg dose) by mouth 2 (two) times daily.   ASPIRIN  81 MG CHEWABLE TABLET    Chew one tablet (81 mg dose) by mouth daily.   ATORVASTATIN  (LIPITOR) 40 MG TABLET    Take one tablet (40 mg dose) by mouth at bedtime.   BLOOD GLUCOSE MONITORING SUPPL (BLOOD GLUCOSE MONITOR SYSTEM) W/DEVICE KIT    1 kit by Does not apply route 4 (four) times a day with meals and at bedtime.   BLOOD PRESSURE KIT    1 Device by Does not apply route daily.   DIGOXIN  (LANOXIN ,DIGITEK,DIGOX ) 125 MCG TABLET    Take one half tablet (0.0625 mg dose) by mouth daily.   ERGOCALCIFEROL (VITAMIN D2) 50,000 UNITS CAPS CAPSULE    Take fifty capsules (2,500,000 Units dose) by mouth once a week at 0900.   GABAPENTIN (NEURONTIN) 100 MG CAPSULE    Take one capsule (100 mg dose) by mouth 3 (three) times a day.   GLUCOSE BLOOD TEST STRIP    Use as directed. Pharmacy, please dispense patient choice of test strips.   INSULIN  GLARGINE (BASAGLAR KWIKPEN) 100 UNIT/ML SOPN    Inject fifteen Units into the skin at bedtime.   INSULIN  PEN NEEDLE 30G X 5 MM MISC    one each by Does not apply route daily.   METFORMIN  (GLUCOPHAGE ) 1000 MG TABLET    Take 1 tablet/day for first week.  Second week onward transition to taking 2 tablets daily.   SILDENAFIL  CITRATE (REVATIO ) 20 MG TABLET    Take one tablet (20 mg dose) by mouth 3 (three) times a day.   SPIRONOLACTONE  (ALDACTONE ) 25 MG TABLET    Take one tablet (25 mg dose) by mouth daily.   TORSEMIDE  (DEMADEX ) 20 MG TABLET    Tablet one tablet per day only for swelling.  Modified Medications   Modified Medication Previous Medication   DAPAGLIFLOZIN  (FARXIGA ) 10 MG TABLET dapagliflozin   (FARXIGA ) 10 mg tablet      Take one tablet (10 mg dose) by mouth daily.    Take one tablet (10 mg dose) by mouth daily.  Discontinued Medications   APIXABAN  (ELIQUIS ) 5 MG TABLET    Take one tablet (5 mg dose) by mouth 2 (two) times daily.      Risks, benefits, and alternatives of the medications and treatment plan prescribed today were discussed, and patient expressed understanding. Plan follow-up as discussed or as needed if any worsening symptoms or change in condition.   A yearly preventative health exam was recommended and current age based recommendations were discussed.  I have reviewed the information contained in this note and personally verified its accuracy.  MDM billing - I personally developed the plan of care based on documented medical decision making.  Gabriel KATHEE Rice II, PA-C

## 2024-08-16 ENCOUNTER — Telehealth: Payer: Self-pay | Admitting: Licensed Clinical Social Worker

## 2024-08-16 ENCOUNTER — Ambulatory Visit: Payer: Self-pay | Attending: Student | Admitting: Student

## 2024-08-16 ENCOUNTER — Encounter: Payer: Self-pay | Admitting: Student

## 2024-08-16 VITALS — BP 108/72 | HR 70 | Ht 70.0 in | Wt 182.2 lb

## 2024-08-16 DIAGNOSIS — I272 Pulmonary hypertension, unspecified: Secondary | ICD-10-CM

## 2024-08-16 DIAGNOSIS — I5022 Chronic systolic (congestive) heart failure: Secondary | ICD-10-CM | POA: Diagnosis not present

## 2024-08-16 DIAGNOSIS — I63512 Cerebral infarction due to unspecified occlusion or stenosis of left middle cerebral artery: Secondary | ICD-10-CM

## 2024-08-16 LAB — CUP PACEART INCLINIC DEVICE CHECK
Battery Remaining Longevity: 20 mo
Brady Statistic RA Percent Paced: 0.75 %
Brady Statistic RV Percent Paced: 96 %
Date Time Interrogation Session: 20250916111849
HighPow Impedance: 59.625
Implantable Lead Connection Status: 753985
Implantable Lead Connection Status: 753985
Implantable Lead Connection Status: 753985
Implantable Lead Implant Date: 20210526
Implantable Lead Implant Date: 20210526
Implantable Lead Implant Date: 20210526
Implantable Lead Location: 753858
Implantable Lead Location: 753859
Implantable Lead Location: 753860
Implantable Pulse Generator Implant Date: 20210526
Lead Channel Impedance Value: 425 Ohm
Lead Channel Impedance Value: 487.5 Ohm
Lead Channel Impedance Value: 637.5 Ohm
Lead Channel Pacing Threshold Amplitude: 0.625 V
Lead Channel Pacing Threshold Amplitude: 1.25 V
Lead Channel Pacing Threshold Amplitude: 1.25 V
Lead Channel Pacing Threshold Amplitude: 2.25 V
Lead Channel Pacing Threshold Amplitude: 2.25 V
Lead Channel Pacing Threshold Pulse Width: 0.5 ms
Lead Channel Pacing Threshold Pulse Width: 0.5 ms
Lead Channel Pacing Threshold Pulse Width: 0.5 ms
Lead Channel Pacing Threshold Pulse Width: 1 ms
Lead Channel Pacing Threshold Pulse Width: 1 ms
Lead Channel Sensing Intrinsic Amplitude: 11.8 mV
Lead Channel Sensing Intrinsic Amplitude: 5 mV
Lead Channel Setting Pacing Amplitude: 1.625
Lead Channel Setting Pacing Amplitude: 2.5 V
Lead Channel Setting Pacing Amplitude: 3.25 V
Lead Channel Setting Pacing Pulse Width: 0.5 ms
Lead Channel Setting Pacing Pulse Width: 1 ms
Lead Channel Setting Sensing Sensitivity: 0.5 mV
Pulse Gen Serial Number: 111023761
Zone Setting Status: 755011

## 2024-08-16 NOTE — Telephone Encounter (Signed)
 H&V Care Navigation CSW Progress Note  Clinical Social Worker completed chart review to identify if any coverage on file. Pt previously completed assistance applications but was denied as he had commercial coverage at that time. Note that pt currently still has coverage pulling under pharmacy benefits as an active marketplace Engineer, drilling) plan. Met with pt and provided him with that information and encouraged him to bring to pharmacy for any scripts to ensure they have that on file. Pt interested in mail order delivery through [facility] pharmacy.   LCSW will also send this information to patient access team to see if it can be manually added to his account since he does not have a card.   Patient is participating in a Managed Medicaid Plan:  No, [employer] Museum/gallery exhibitions officer only  SDOH Screenings   Food Insecurity: No Food Insecurity (12/08/2023)   Received from [facility]  Housing: Low Risk  (12/08/2023)   Received from [facility]  Transportation Needs: No Transportation Needs (12/08/2023)   Received from [facility]  Utilities: Not At Risk (12/08/2023)   Received from [facility]  Financial Resource Strain: Low Risk  (12/08/2023)   Received from [facility]  Recent Concern: Financial Resource Strain - High Risk (10/05/2023)   Received from [facility]  Physical Activity: Not on File (10/30/2022)   Received from [facility]  Social Connections: Unknown (09/22/2023)   Received from [facility]  Stress: No Stress Concern Present (10/05/2023)   Received from [facility]  Tobacco Use: Low Risk  (08/16/2024)  Health Literacy: Adequate Health Literacy (07/27/2023)    Marit Lark, MSW, LCSW Clinical Social Worker [facility] Heart/Vascular Care Navigation  [phone]- work cell phone (preferred)

## 2024-08-16 NOTE — Patient Instructions (Signed)
 Medication Instructions:  Your physician recommends that you continue on your current medications as directed. Please refer to the Current Medication list given to you today.  *If you need a refill on your cardiac medications before your next appointment, please call your pharmacy*  Lab Work: None ordered If you have labs (blood work) drawn today and your tests are completely normal, you will receive your results only by: MyChart Message (if you have MyChart) OR A paper copy in the mail If you have any lab test that is abnormal or we need to change your treatment, we will call you to review the results.  Follow-Up: At [facility], you and your health needs are our priority.  As part of our continuing mission to provide you with exceptional heart care, our providers are all part of one team.  This team includes your primary Cardiologist (physician) and Advanced Practice Providers or APPs (Physician Assistants and Nurse Practitioners) who all work together to provide you with the care you need, when you need it.  Your next appointment:   6 month(s)  Provider:   Ole Holts, MD    We recommend signing up for the patient portal called MyChart.  Sign up information is provided on this After Visit Summary.  MyChart is used to connect with patients for Virtual Visits (Telemedicine).  Patients are able to view lab/test results, encounter notes, upcoming appointments, etc.  Non-urgent messages can be sent to your provider as well.   To learn more about what you can do with MyChart, go to [email or web address].

## 2024-08-16 NOTE — Progress Notes (Signed)
 Electrophysiology Office Note:   ID:  Gabriel Floyd, DOB 12-19-1980, MRN [id number]  Primary Cardiologist: None Electrophysiologist: OLE ONEIDA HOLTS, MD      History of Present Illness:   Gabriel Floyd is a 43 y.o. 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   Interpreter used via green box  Since last being seen in our clinic the patient reports doing OK. No new symptoms or complaints. Has been following with [facility] since his stroke last year, and is considering consolidating his care there.  Otherwise, he denies chest pain, palpitations, dyspnea, PND, orthopnea, nausea, vomiting, dizziness, syncope, weight gain, or early satiety.  He does have mild peripheral edema at times, worse in the evenings.   Review of systems complete and found to be negative unless listed in HPI.   EP Information / Studies Reviewed:    EKG is ordered today. Personal review as below.  EKG Interpretation Date/Time:  Tuesday August 16 2024 08:36:32 EDT Ventricular Rate:  70 PR Interval:    QRS Duration:  178 QT Interval:  518 QTC Calculation: 559 R Axis:   176  Text Interpretation: Ventricular-paced rhythm with frequent Premature ventricular complexes When compared with ECG of 10-Nov-2023 13:39, Premature ventricular complexes are now Present AF underlying Confirmed by Lesia Sharper [phone]) on 08/16/2024 10:01:13 AM    ICD Interrogation-  reviewed in detail today,  See PACEART report.  Arrhythmia/Device History ICD Merlin SK  AHF MD=Dr Bensimhon   Physical Exam:   VS:  BP 108/72   Pulse 70   Ht 5' 10 (1.778 m)   Wt 182 lb 3.2 oz (82.6 kg)   SpO2 99%   BMI 26.14 kg/m    Wt Readings from Last 3 Encounters:  08/16/24 182 lb 3.2 oz (82.6 kg)  12/22/23 183 lb 9.6 oz (83.3 kg)  12/03/23 178 lb 9.6 oz (81 kg)     GEN: No  acute distress  NECK: No JVD; No carotid bruits CARDIAC: Regular rate and rhythm, no murmurs, rubs, gallops RESPIRATORY:  Clear to auscultation without rales, wheezing or rhonchi  ABDOMEN: Soft, non-tender, non-distended EXTREMITIES:  No edema; No deformity   ASSESSMENT AND PLAN:    CHB HCM  s/p Abbott CRT-D  euvolemic today Stable on an appropriate medical regimen Normal ICD function See Pace Art report No changes today  Pulmonary HTN Needs CHF team follow up if staying with [facility].   Longstanding Persistent AF Had ERAF after [facility] last year and with unclear insured status further management has been deferred.  Given significant structural disease AAD options are limited.  Tikosyn would not be ideal with language barrier and intermittent non-compliance.  Would prefer to avoid Amiodarone given his very young age.   He states he is currently uninsured. Will engage with CSW. He has previously had pharmacy coverage and may have a marketplace plan, which would potentially give procedure coverage as well.   He is not sure he would want to proceed with surgery, and he is also considering consolidating his care at [facility]   Recent labs from [place] reviewed.    Disposition:   Follow up with Dr. HOLTS in 6 months as a Print production planner. CSW has seen and will help clarify his coverage with billing.   If he does have procedure coverage, he would likely still have higher than usual out of pocket cost per  CSW. We will see if we can get an estimate for patient cost if procedures are covered.   He will let us  know if he wishes to transfer his care to [facility].     Signed, Ozell Prentice Passey, PA-C

## 2024-08-16 NOTE — Progress Notes (Signed)
 H&V Care Navigation CSW Progress Note  Clinical Social Worker met with patient to f/u on concerns related to coverage and ongoing need for medical work up. Introduced self, role, reason for visit with assistance of Spanish language [employer] interpreter. Pt confirmed home address, currently did not think he had any insurance. Shared that I was able to locate benefits under pharmacy coverage, looks to be a Ecologist Forensic psychologist Next). Pt does not have a card so I provided him with the information, also gave billing contact and my number for any other questions or issues. Pt ineligible for [employer] unless bills after processed by insurance are > $5000. Pt encouraged to call me with any questions, I will f/u with him next week to answer any concerns that may have arisen after this appt.   Sent message to Alan Pho with patient access team, she will try and f/u on coverage noted and add to chart if able. From that pt should be able to get prior authorization if needed/estimates from billing teams.  Patient is participating in a Managed Medicaid Plan:  No, Amer[employer] Museum/gallery exhibitions officer  SDOH Screenings   Food Insecurity: No Food Insecurity (12/08/2023)   Received from [facility]  Housing: Low Risk  (12/08/2023)   Received from [facility]  Transportation Needs: No Transportation Needs (12/08/2023)   Received from [facility]  Utilities: Not At Risk (12/08/2023)   Received from [facility]  Financial Resource Strain: Low Risk  (12/08/2023)   Received from [facility]  Recent Concern: Financial Resource Strain - High Risk (10/05/2023)   Received from [facility]  Physical Activity: Not on File (10/30/2022)   Received [facility]  Social Connections: Unknown (09/22/2023)   Received from [facility]  Stress: No Stress Concern Present (10/05/2023)   Received from [facility]  Tobacco Use: Low Risk  (08/16/2024)  Health Literacy: Adequate Health Literacy  (07/27/2023)    Marit Lark, MSW, LCSW Clinical Social Worker II [facility] Health Heart/Vascular Care Navigation  [phone]- work cell phone (preferred)

## 2024-08-17 ENCOUNTER — Telehealth: Payer: Self-pay | Admitting: Licensed Clinical Social Worker

## 2024-08-17 NOTE — Telephone Encounter (Signed)
 H&V Care Navigation CSW Progress Note  Clinical Social Worker received a call from pt sister Rhonda Fetters (DPR on file, [phone]) to get more information about appt yesterday. LCSW shared I am not able to share medical specifics as a Child psychotherapist, but that pt has been recommended to see our EP provider Dr. Cindie and there was discussion about next steps with care. Pt sister is interested in speaking with PA from yesterday if possible, I also encouraged her to try and attend next appointment with pt if possible. Per Bibi her sister Aracely was the one who had spoken with team during appt yesterday. I shared with pt sister that it appears pt has been going to [facility] providers and stated he may consolidate care with [facility]. She shares that she thinks pt isnt sure which providers are with who b/c he mentioned he wants to stay with the team he saw yesterday and I clarified this is [facility].   I shared that there was some confusion about pt coverage, it appears he has insurance through Designer, industrial/product. Provided her with Amer[employer] number to try and help pt request a new card. I shared that I had sent pt information to patient access team to try and add to account, but oftentimes we do need a card with benefits on file. Pt should be able to bring the information that was given yesterday to pharmacy for any refills, and encouraged him/family to call if any cost concerns with medications. Pt can request estimate from billing- cautioned that estimates oftentimes are not completely accurate since it does have to be processed by insurance company- there is a Public relations account executive through hospital if >$5,000 in outstanding bills after insurance processes that we would be happy to walk pt through.   No additional questions at this time for this Clinical research associate. I will route to provider from yesterday to see if he may be able to speak with pt sister.   Patient is participating in a  Managed Medicaid Plan:  No, Amerihealth Museum/gallery exhibitions officer  SDOH Screenings   Food Insecurity: No Food Insecurity (12/08/2023)   Received from [facility]  Housing: Low Risk  (12/08/2023)   Received from [facility]  Transportation Needs: No Transportation Needs (12/08/2023)   Received from [facility]  Utilities: Not At Risk (12/08/2023)   Received from [facility]  Financial Resource Strain: Low Risk  (12/08/2023)   Received from [facility]  Recent Concern: Financial Resource Strain - High Risk (10/05/2023)   Received from [facility]  Physical Activity: Not on File (10/30/2022)   Received from [facility]  Social Connections: Unknown (09/22/2023)   Received from [facility]  Stress: No Stress Concern Present (10/05/2023)   Received from [facility]  Tobacco Use: Low Risk  (08/16/2024)  Health Literacy: Adequate Health Literacy (07/27/2023)    Marit Lark, MSW, LCSW Clinical Social Worker II [facility] Health Heart/Vascular Care Navigation  [phone]- work cell phone (preferred)

## 2024-08-18 ENCOUNTER — Ambulatory Visit: Payer: Self-pay | Admitting: Cardiology

## 2024-08-24 NOTE — Telephone Encounter (Signed)
 H&V Care Navigation CSW Progress Note  Clinical Social Worker reached out Artist with [employer] team, Rojelio Sevin to try and add amerihealth coverage to accounts. I had previously reached out to [facility] patient acccess team and was unable to get it added. Financial counselor will try and add it for future and past eligible accounts.  Patient is participating in a Managed Medicaid Plan:  No,commercial Amer[employer] only  SDOH Screenings   Food Insecurity: No Food Insecurity (12/08/2023)   Received from [facility]  Housing: Low Risk  (12/08/2023)   Received from [facility]  Transportation Needs: No Transportation Needs (12/08/2023)   Received from [facility]  Utilities: Not At Risk (12/08/2023)   Received from [facility]  Financial Resource Strain: Low Risk  (12/08/2023)   Received from [facility]  Recent Concern: Financial Resource Strain - High Risk (10/05/2023)   Received from [facility]  Physical Activity: Not on File (10/30/2022)   Received from [facility]  Social Connections: Unknown (09/22/2023)   Received from [facility]  Stress: No Stress Concern Present (10/05/2023)   Received from [facility]  Tobacco Use: Low Risk  (08/16/2024)  Health Literacy: Adequate Health Literacy (07/27/2023)    Marit Lark, MSW, LCSW Clinical Social Worker II [facility] Health Heart/Vascular Care Navigation  [phone]- work cell phone (preferred)

## 2024-09-01 NOTE — Progress Notes (Signed)
 Remote ICD Transmission

## 2024-09-22 ENCOUNTER — Ambulatory Visit (INDEPENDENT_AMBULATORY_CARE_PROVIDER_SITE_OTHER): Payer: Self-pay

## 2024-09-22 DIAGNOSIS — I63512 Cerebral infarction due to unspecified occlusion or stenosis of left middle cerebral artery: Secondary | ICD-10-CM

## 2024-09-22 LAB — CUP PACEART REMOTE DEVICE CHECK
Battery Remaining Longevity: 19 mo
Battery Remaining Percentage: 33 %
Battery Voltage: 2.87 V
Brady Statistic AP VP Percent: 0 %
Brady Statistic AP VS Percent: 0 %
Brady Statistic AS VP Percent: 0 %
Brady Statistic AS VS Percent: 0 %
Brady Statistic RA Percent Paced: 1 %
Date Time Interrogation Session: 20251023023230
HighPow Impedance: 66 Ohm
Implantable Lead Connection Status: 753985
Implantable Lead Connection Status: 753985
Implantable Lead Connection Status: 753985
Implantable Lead Implant Date: 20210526
Implantable Lead Implant Date: 20210526
Implantable Lead Implant Date: 20210526
Implantable Lead Location: 753858
Implantable Lead Location: 753859
Implantable Lead Location: 753860
Implantable Pulse Generator Implant Date: 20210526
Lead Channel Impedance Value: 430 Ohm
Lead Channel Impedance Value: 450 Ohm
Lead Channel Impedance Value: 650 Ohm
Lead Channel Pacing Threshold Amplitude: 0.625 V
Lead Channel Pacing Threshold Amplitude: 1.25 V
Lead Channel Pacing Threshold Amplitude: 2.25 V
Lead Channel Pacing Threshold Pulse Width: 0.5 ms
Lead Channel Pacing Threshold Pulse Width: 0.5 ms
Lead Channel Pacing Threshold Pulse Width: 1 ms
Lead Channel Sensing Intrinsic Amplitude: 11.8 mV
Lead Channel Sensing Intrinsic Amplitude: 5 mV
Lead Channel Setting Pacing Amplitude: 1.625
Lead Channel Setting Pacing Amplitude: 2.5 V
Lead Channel Setting Pacing Amplitude: 3.25 V
Lead Channel Setting Pacing Pulse Width: 0.5 ms
Lead Channel Setting Pacing Pulse Width: 1 ms
Lead Channel Setting Sensing Sensitivity: 0.5 mV
Pulse Gen Serial Number: 111023761
Zone Setting Status: 755011

## 2024-09-23 NOTE — Progress Notes (Signed)
 Remote ICD Transmission

## 2024-09-26 ENCOUNTER — Ambulatory Visit: Payer: Self-pay | Admitting: Cardiology

## 2024-10-10 ENCOUNTER — Ambulatory Visit: Payer: Self-pay | Admitting: Cardiology

## 2024-10-18 ENCOUNTER — Other Ambulatory Visit (HOSPITAL_COMMUNITY): Payer: Self-pay | Admitting: Family Medicine

## 2024-12-22 ENCOUNTER — Ambulatory Visit (INDEPENDENT_AMBULATORY_CARE_PROVIDER_SITE_OTHER): Payer: Self-pay

## 2024-12-22 ENCOUNTER — Ambulatory Visit: Payer: Self-pay | Admitting: Cardiology

## 2024-12-22 DIAGNOSIS — I63512 Cerebral infarction due to unspecified occlusion or stenosis of left middle cerebral artery: Secondary | ICD-10-CM

## 2024-12-22 LAB — CUP PACEART REMOTE DEVICE CHECK
Battery Remaining Longevity: 19 mo
Battery Remaining Percentage: 31 %
Battery Voltage: 2.86 V
Brady Statistic AP VP Percent: 0 %
Brady Statistic AP VS Percent: 0 %
Brady Statistic AS VP Percent: 0 %
Brady Statistic AS VS Percent: 0 %
Brady Statistic RA Percent Paced: 1 %
Date Time Interrogation Session: 20260122020030
HighPow Impedance: 61 Ohm
Implantable Lead Connection Status: 753985
Implantable Lead Connection Status: 753985
Implantable Lead Connection Status: 753985
Implantable Lead Implant Date: 20210526
Implantable Lead Implant Date: 20210526
Implantable Lead Implant Date: 20210526
Implantable Lead Location: 753858
Implantable Lead Location: 753859
Implantable Lead Location: 753860
Implantable Pulse Generator Implant Date: 20210526
Lead Channel Impedance Value: 430 Ohm
Lead Channel Impedance Value: 490 Ohm
Lead Channel Impedance Value: 710 Ohm
Lead Channel Pacing Threshold Amplitude: 0.625 V
Lead Channel Pacing Threshold Amplitude: 1.25 V
Lead Channel Pacing Threshold Amplitude: 2.25 V
Lead Channel Pacing Threshold Pulse Width: 0.5 ms
Lead Channel Pacing Threshold Pulse Width: 0.5 ms
Lead Channel Pacing Threshold Pulse Width: 1 ms
Lead Channel Sensing Intrinsic Amplitude: 12 mV
Lead Channel Sensing Intrinsic Amplitude: 5 mV
Lead Channel Setting Pacing Amplitude: 1.625
Lead Channel Setting Pacing Amplitude: 2.5 V
Lead Channel Setting Pacing Amplitude: 3.25 V
Lead Channel Setting Pacing Pulse Width: 0.5 ms
Lead Channel Setting Pacing Pulse Width: 1 ms
Lead Channel Setting Sensing Sensitivity: 0.5 mV
Pulse Gen Serial Number: 111023761
Zone Setting Status: 755011

## 2024-12-24 NOTE — Progress Notes (Signed)
 Remote ICD Transmission

## 2024-12-29 ENCOUNTER — Telehealth: Payer: Self-pay | Admitting: Cardiology

## 2024-12-29 NOTE — Telephone Encounter (Signed)
 Patient stopped by to request medical records from us  for all Heart care and specified a procedure, left Release of information completed along with drivers license on Leanne Walter's desk. 1.29.26 @ 1pm

## 2025-01-06 NOTE — Telephone Encounter (Signed)
 Forwarding to billing to check if insurance would cover an afib ablation. Alan please let me know what you find out. (Pt scheduled for OV 3/13 w/ [facility])

## 2025-02-10 ENCOUNTER — Ambulatory Visit: Admitting: Cardiology

## 2025-03-23 ENCOUNTER — Ambulatory Visit

## 2025-06-22 ENCOUNTER — Ambulatory Visit

## 2025-09-21 ENCOUNTER — Ambulatory Visit

## 2025-12-21 ENCOUNTER — Ambulatory Visit
